# Patient Record
Sex: Male | Born: 1950 | Race: White | Hispanic: No | Marital: Married | Smoking: Never smoker
Health system: Southern US, Community
[De-identification: ages and names within clinical notes are randomized; demographics above are authoritative.]

## PROBLEM LIST (undated history)

## (undated) DIAGNOSIS — C61 Malignant neoplasm of prostate: Secondary | ICD-10-CM

## (undated) DIAGNOSIS — E785 Hyperlipidemia, unspecified: Secondary | ICD-10-CM

## (undated) DIAGNOSIS — I491 Atrial premature depolarization: Secondary | ICD-10-CM

## (undated) DIAGNOSIS — I2510 Atherosclerotic heart disease of native coronary artery without angina pectoris: Secondary | ICD-10-CM

## (undated) DIAGNOSIS — D649 Anemia, unspecified: Secondary | ICD-10-CM

## (undated) DIAGNOSIS — C801 Malignant (primary) neoplasm, unspecified: Secondary | ICD-10-CM

## (undated) DIAGNOSIS — M1990 Unspecified osteoarthritis, unspecified site: Secondary | ICD-10-CM

## (undated) DIAGNOSIS — G4730 Sleep apnea, unspecified: Secondary | ICD-10-CM

## (undated) DIAGNOSIS — E119 Type 2 diabetes mellitus without complications: Secondary | ICD-10-CM

## (undated) DIAGNOSIS — I4892 Unspecified atrial flutter: Secondary | ICD-10-CM

## (undated) DIAGNOSIS — J189 Pneumonia, unspecified organism: Secondary | ICD-10-CM

## (undated) DIAGNOSIS — K219 Gastro-esophageal reflux disease without esophagitis: Secondary | ICD-10-CM

## (undated) DIAGNOSIS — I10 Essential (primary) hypertension: Secondary | ICD-10-CM

## (undated) HISTORY — DX: Unspecified atrial flutter: I48.92

## (undated) HISTORY — PX: OTHER SURGICAL HISTORY: SHX169

## (undated) HISTORY — PX: CHOLECYSTECTOMY: SHX55

## (undated) HISTORY — DX: Malignant neoplasm of prostate: C61

## (undated) HISTORY — DX: Atherosclerotic heart disease of native coronary artery without angina pectoris: I25.10

## (undated) HISTORY — PX: GASTRIC BYPASS: SHX52

---

## 2012-01-04 DIAGNOSIS — Z9884 Bariatric surgery status: Secondary | ICD-10-CM | POA: Insufficient documentation

## 2014-09-25 HISTORY — PX: CARDIAC CATHETERIZATION: SHX172

## 2014-09-30 ENCOUNTER — Emergency Department (HOSPITAL_COMMUNITY)
Admission: EM | Admit: 2014-09-30 | Discharge: 2014-09-30 | Disposition: A | Discharge status: Home / Self Care | Payer: BLUE CROSS/BLUE SHIELD | Attending: Emergency Medicine | Admitting: Emergency Medicine

## 2014-09-30 ENCOUNTER — Emergency Department (HOSPITAL_COMMUNITY): Payer: BLUE CROSS/BLUE SHIELD

## 2014-09-30 ENCOUNTER — Encounter (HOSPITAL_COMMUNITY): Payer: Self-pay | Admitting: Family Medicine

## 2014-09-30 DIAGNOSIS — M7981 Nontraumatic hematoma of soft tissue: Secondary | ICD-10-CM | POA: Insufficient documentation

## 2014-09-30 DIAGNOSIS — R0600 Dyspnea, unspecified: Secondary | ICD-10-CM | POA: Diagnosis not present

## 2014-09-30 DIAGNOSIS — R0602 Shortness of breath: Secondary | ICD-10-CM | POA: Diagnosis present

## 2014-09-30 DIAGNOSIS — R609 Edema, unspecified: Secondary | ICD-10-CM | POA: Diagnosis not present

## 2014-09-30 DIAGNOSIS — Z8669 Personal history of other diseases of the nervous system and sense organs: Secondary | ICD-10-CM | POA: Diagnosis not present

## 2014-09-30 DIAGNOSIS — Z859 Personal history of malignant neoplasm, unspecified: Secondary | ICD-10-CM | POA: Diagnosis not present

## 2014-09-30 DIAGNOSIS — E119 Type 2 diabetes mellitus without complications: Secondary | ICD-10-CM | POA: Diagnosis not present

## 2014-09-30 DIAGNOSIS — R0609 Other forms of dyspnea: Secondary | ICD-10-CM

## 2014-09-30 HISTORY — DX: Type 2 diabetes mellitus without complications: E11.9

## 2014-09-30 HISTORY — DX: Sleep apnea, unspecified: G47.30

## 2014-09-30 HISTORY — DX: Malignant (primary) neoplasm, unspecified: C80.1

## 2014-09-30 LAB — BASIC METABOLIC PANEL
ANION GAP: 8 (ref 5–15)
BUN: 14 mg/dL (ref 6–20)
CALCIUM: 8.9 mg/dL (ref 8.9–10.3)
CO2: 23 mmol/L (ref 22–32)
Chloride: 108 mmol/L (ref 101–111)
Creatinine, Ser: 0.81 mg/dL (ref 0.61–1.24)
GFR calc non Af Amer: >60 mL/min (ref >60)
Glucose, Bld: 113 mg/dL — ABNORMAL HIGH (ref 65–99)
Potassium: 4.3 mmol/L (ref 3.5–5.1)
Sodium: 139 mmol/L (ref 135–145)

## 2014-09-30 LAB — CBC
HEMATOCRIT: 44.2 % (ref 39.0–52.0)
Hemoglobin: 15.3 g/dL (ref 13.0–17.0)
MCH: 31.5 pg (ref 26.0–34.0)
MCHC: 34.6 g/dL (ref 30.0–36.0)
MCV: 91.1 fL (ref 78.0–100.0)
PLATELETS: 236 10*3/uL (ref 150–400)
RBC: 4.85 MIL/uL (ref 4.22–5.81)
RDW: 12.6 % (ref 11.5–15.5)
WBC: 7.8 10*3/uL (ref 4.0–10.5)

## 2014-09-30 LAB — I-STAT TROPONIN, ED: Troponin i, poc: 0 ng/mL (ref 0.00–0.08)

## 2014-09-30 LAB — BRAIN NATRIURETIC PEPTIDE: B NATRIURETIC PEPTIDE 5: 35.2 pg/mL (ref 0.0–100.0)

## 2014-09-30 NOTE — ED Notes (Signed)
Pulse Ox at 96-98% on room air and 74 Heart rate.  Patient denies feeling SOB, no lightheaded or dizziness.

## 2014-09-30 NOTE — Discharge Instructions (Signed)

## 2014-09-30 NOTE — ED Notes (Signed)
Pt here for SOB and fatigue over the weekend. sts recent cath last week. sts told 2 blockages.

## 2014-09-30 NOTE — ED Provider Notes (Signed)
CSN: [id number]     Arrival date & time 09/30/14  0626 History   First MD Initiated Contact with Patient 09/30/14 [phone]     Chief Complaint  Patient presents with  . Shortness of Breath    Patient is a 64 y.o. male presenting with shortness of breath. The history is provided by the patient. No language interpreter was used.  Shortness of Breath  [name] presents for evaluation of shortness of breath. He had a cardiac cath a week ago Friday for irregular heartbeat as well as abnormal stress test. The heart Demonstrated a complete occlusion of the LAD with good collateral flow. The procedure was performed at [facility] and the cath was performed through his right wrist. He was feeling well after the cath. He began feeling fatigued this weekend and today increased shortness of breath and dyspnea on exertion. He denies any fevers, chest pain, abdominal pain, vomiting, diarrhea. The only new medication is Imdur. Symptoms are moderate, constant, worsening.  Past Medical History  Diagnosis Date  . Diabetes mellitus without complication   . Sleep apnea   . Cancer    Past Surgical History  Procedure Laterality Date  . Gastric bypass    . Cardiac surgery     History reviewed. No pertinent family history. History  Substance Use Topics  . Smoking status: Never Smoker   . Smokeless tobacco: Not on file  . Alcohol Use: Yes     Comment: occ    Review of Systems  Respiratory: Positive for shortness of breath.   All other systems reviewed and are negative.     Allergies  Review of patient's allergies indicates no known allergies.  Home Medications   Prior to Admission medications   Not on File   BP 154/95 mmHg  Pulse 68  Temp(Src) 98.2 F (36.8 C)  Resp 18  Ht 6\' 3"  (1.905 m)  Wt 360 lb (163.295 kg)  BMI 45.00 kg/m2  SpO2 98% Physical Exam  Constitutional: He is oriented to person, place, and time. He appears well-developed and well-nourished.  HENT:  Head:  Normocephalic and atraumatic.  Cardiovascular: Normal rate and regular rhythm.   No murmur heard. Pulmonary/Chest: Effort normal and breath sounds normal. No respiratory distress.  Abdominal: Soft. There is no tenderness. There is no rebound and no guarding.  Musculoskeletal: He exhibits no tenderness.  2+ edema in bilateral lower extremities, bruise over right wrist. 2+ radial pulses.  Neurological: He is alert and oriented to person, place, and time.  Skin: Skin is warm and dry.  Psychiatric: He has a normal mood and affect. His behavior is normal.  Nursing note and vitals reviewed.   ED Course  Procedures (including critical care time) Labs Review Labs Reviewed  BASIC METABOLIC PANEL - Abnormal; Notable for the following:    Glucose, Bld 113 (*)    All other components within normal limits  CBC  BRAIN NATRIURETIC PEPTIDE  I-STAT TROPOININ, ED    Imaging Review Dg Chest 2 View  09/30/2014   CLINICAL DATA:  Shortness of breath this morning, heart disease, recent catheterization, diabetes, sleep apnea  EXAM: CHEST  2 VIEW  COMPARISON:  None  FINDINGS: Normal heart size and pulmonary vascularity.  Atherosclerotic calcification and tortuosity of thoracic aorta.  Minimal central peribronchial thickening.  No infiltrate, pleural effusion or pneumothorax.  Bones unremarkable.  IMPRESSION: Minimal bronchitic changes without infiltrate.   Electronically Signed   By: Lavonia Dana M.D.   On: 09/30/2014 10:42  EKG Interpretation   Date/Time:  Monday September 30 2014 09:03:29 EDT Ventricular Rate:  73 PR Interval:  142 QRS Duration: 92 QT Interval:  404 QTC Calculation: 445 R Axis:   -87 Text Interpretation:  Unusual P axis, possible ectopic atrial rhythm with  Premature atrial complexes Left anterior fascicular block Septal infarct ,  age undetermined Abnormal ECG Confirmed by Hazle Coca [id number]) on 09/30/2014  9:51:53 AM      MDM   Final diagnoses:  Dyspnea on exertion    Patient  here for evaluation of dyspnea on exertion and shortness of breath. EKG without any acute ischemic changes. There is no evidence of acute congestive heart failure, pneumonia, COPD exacerbation. History of presentation is not consistent with PE. Patient is requesting cardiology follow-up, contacted cardiology and they will contact the patient with an appointment time. Discussed unclear cause of dyspnea and recommend close outpatient follow-up as well as return precautions.    Quintella Reichert, MD 09/30/14 1145

## 2014-10-03 ENCOUNTER — Telehealth: Payer: Self-pay | Admitting: Cardiovascular Disease

## 2014-10-03 NOTE — Telephone Encounter (Signed)
New message      Pt was seen in the ER on Monday, June 6th.  He is a Theme park manager.  He said he was told to call and ask for Lauren about getting an appt with Dr Burt Knack.  I cannot remember if Dr Burt Knack saw him in the ER and if Dr Burt Knack told pt to call and ask for Lauren.

## 2014-10-03 NOTE — Telephone Encounter (Signed)
Per Dr Burt Knack the pt needs to obtain his 09/20/14 cardiac cath films from [facility] for review. I spoke with the pt and he will obtain his cath films and forward this to our office. The pt saw Dr Shirline Frees yesterday for cardiology follow-up and he is seeking a second opinion on CAD by Dr Burt Knack. Once I receive his cath films I will contact he pt to schedule an appointment. Pt agreed with plan.

## 2014-10-11 ENCOUNTER — Ambulatory Visit: Payer: BLUE CROSS/BLUE SHIELD | Admitting: Cardiology

## 2014-10-23 ENCOUNTER — Encounter: Payer: Self-pay | Admitting: Cardiovascular Disease

## 2014-10-23 ENCOUNTER — Ambulatory Visit (INDEPENDENT_AMBULATORY_CARE_PROVIDER_SITE_OTHER): Payer: BLUE CROSS/BLUE SHIELD | Admitting: Cardiovascular Disease

## 2014-10-23 VITALS — BP 158/80 | HR 34 | Ht 75.0 in | Wt 367.8 lb

## 2014-10-23 DIAGNOSIS — I2510 Atherosclerotic heart disease of native coronary artery without angina pectoris: Secondary | ICD-10-CM | POA: Diagnosis not present

## 2014-10-23 NOTE — Patient Instructions (Signed)
Medication Instructions:  Your physician recommends that you continue on your current medications as directed. Please refer to the Current Medication list given to you today.  Labwork: No new orders.   Testing/Procedures: No new orders.  Follow-Up: Your physician wants you to follow-up in: 3 MONTHS (October 2016) with Dr Burt Knack.  You will receive a reminder letter in the mail two months in advance. If you don't receive a letter, please call our office to schedule the follow-up appointment.   Any Other Special Instructions Will Be Listed Below (If Applicable).  You have been referred to [place].

## 2014-10-23 NOTE — Progress Notes (Signed)
Cardiology Office Note Date:  10/23/2014   ID:  Garrett Welch, DOB 06/05/1950, MRN [id number]  PCP:  Tempie Hoist, MD  Cardiologist:  Sherren Mocha, MD    Chief Complaint  Patient presents with  . Coronary Artery Disease    History of Present Illness: Garrett Welch is a 64 y.o. male who presents for a second opinion about his coronary artery disease.   The patient was recently referred to cardiology for heart  "irregularity."    The patient was noted to have an abnormal EKG and because of diabetes and obesity he was referred for a stress nuclear scan. At low level activity he developed ischemic EKG changes and nonsustained VT. He was subsequently referred for cardiac catheterization. This demonstrated total occlusion of the LAD beginning at the ostium of the vessel. The LAD is collateralized from the right coronary artery. 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 The patient's left ventricular function is normal. Medical therapy has been recommended. He presents today for a second opinion.   the patient was diagnosed with type 2 diabetes approximately 2 years ago. He takes metformin and tolerates this well. He did undergo gastric bypass surgery several years ago at which time he weighed 437 pounds. His weight decreased to 280 pounds and he is now back up in the range of 350 pounds. He does admit to dyspnea with exertion. He has no chest pain or pressure. He has not had lightheadedness or syncope. He is limited by knee pain , fatigue, and exertional dyspnea. He does not have orthopnea or PND. He does have chronic leg swelling and wears compression hose.   Past Medical History  Diagnosis Date  . Diabetes mellitus without complication   . Sleep apnea   . Cancer   . Prostate cancer     Past Surgical History  Procedure Laterality Date  . Gastric bypass      Current Outpatient Prescriptions    Medication Sig Dispense Refill  . acetaminophen (TYLENOL) 500 MG tablet Take 1,000 mg by mouth every 8 (eight) hours as needed (pain).    Marland Kitchen albuterol (PROVENTIL) (2.5 MG/3ML) 0.083% nebulizer solution Take 2.5 mg by nebulization every 6 (six) hours as needed for wheezing or shortness of breath.     . Aspirin (ASPIR-81 PO) Take 81 mg by mouth daily.    . ferrous sulfate 325 (65 FE) MG EC tablet Take 325 mg by mouth 2 (two) times daily.     . fluticasone (FLONASE) 50 MCG/ACT nasal spray Place 1 spray into both nostrils daily.    . isosorbide mononitrate (IMDUR) 30 MG 24 hr tablet Take 30 mg by mouth daily.  5  . metFORMIN (GLUCOPHAGE) 500 MG tablet Take 500 mg by mouth 2 (two) times daily.  9  . Multiple Vitamins-Minerals (MULTIVITAMIN WITH MINERALS) tablet Take 1 tablet by mouth daily.    . naproxen sodium (ANAPROX) 220 MG tablet Take 220 mg by mouth daily as needed (pain).    Marland Kitchen omega-3 fish oil (MAXEPA) 1000 MG CAPS capsule Take 2 g by mouth daily.    . pramipexole (MIRAPEX) 0.125 MG tablet Take 0.125 mg by mouth at bedtime.  2  . sodium fluoride (PREVIDENT 5000 PLUS) 1.1 % CREA dental cream Take 1 application by mouth at bedtime.    . tamsulosin (FLOMAX) 0.4 MG CAPS capsule Take 0.4 mg by mouth daily.  3   No current facility-administered  medications for this visit.    Allergies:   Tape   Social History:  The patient  reports that he has never smoked. He does not have any smokeless tobacco history on file. He reports that he drinks alcohol.   Family History:  The patient's  family history includes Cancer in his father; Heart attack in his mother; Heart disease in his mother; Heart failure in his mother; Kidney failure in his father; Stroke in his father.    ROS:  Please see the history of present illness.  Otherwise, review of systems is positive for  Bilateral knee pain, fatigue, exertional dyspnea.  All other systems are reviewed and negative.    PHYSICAL EXAM: VS:  BP 158/80 mmHg   Pulse 34  Ht 6\' 3"  (1.905 m)  Wt 367 lb 12.8 oz (166.833 kg)  BMI 45.97 kg/m2 , BMI Body mass index is 45.97 kg/(m^2). GEN: Well nourished, well developed, in no acute distress HEENT: normal Neck: no JVD, no masses. No carotid bruits Cardiac: RRR without murmur or gallop                Respiratory:  clear to auscultation bilaterally, normal work of breathing GI: soft, nontender, nondistended, + BS MS: no deformity or atrophy Ext: 1+ bilateral pretibial edema Skin: warm and dry, no rash Neuro:  Strength and sensation are intact Psych: euthymic mood, full affect  EKG:  EKG is not ordered today.  Recent Labs: 09/30/2014: B Natriuretic Peptide 35.2; BUN 14; Creatinine, Ser 0.81; Hemoglobin 15.3; Platelets 236; Potassium 4.3; Sodium 139   Lipid Panel  No results found for: CHOL, TRIG, HDL, CHOLHDL, VLDL, LDLCALC, LDLDIRECT    Wt Readings from Last 3 Encounters:  10/23/14 367 lb 12.8 oz (166.833 kg)  09/30/14 360 lb (163.295 kg)     Cardiac Studies Reviewed: Cardiac Cath 09/20/2014: FINDINGS: 1. Left Main: Patent. 2. LAD: Occluded in the ostial portion 3. Circumflex: The circumflex comes off the right coronary cusp adjacent to the right coronary artery. There is a 40-50% stenosis in the midportion. There is a intermediate ramus artery which is widely patent comes off the left main coronary artery 4. Right Coronary Artery: Widely patent. The RCA is large. The PDA and PLV branches are widely patent. The RCA supplies good collateral flow to the mid and distal LAD aortic pressure is  5. Hemodynamics: aortic pressure is128/67 mmHg LV pressure is 139/10 mmHg  CONCLUSIONS: 1. 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 Plan medical therapy  24 hour Holter: 24 hour Holter recording beginning 10/04/14.  1.The patient remained in sinus rhythm throughout  with brief exceptions as noted below.Heart rate transiently fell as low as 33 bpm during sleeping hours.Average heart rate was 65 bpm with maximum rate of 103 bpm.  2.A single PVC was recorded.  3.There were frequent premature atrial contractions often occurring in a bigeminal rhythm.11,861 PACs were counted.There were 86 runs of supraventricular ectopic beats of 3-5 beats.These had slow rates ranging from 61 bpm to 98 beats per minute.  4.There appeared to be some sinus node suppression during sleeping hours with pauses after PACs up to about 2 seconds.There may have been at least one nonconducted PAC but it is difficult to determine this from the tracing.  5.There were no significant ST-T abnormalities.    Conclusions: Abnormal Holter recording with very frequent premature atrial ectopic beats as  described above.  ASSESSMENT AND PLAN: 1.  CAD, native vessel, without angina. Total occlusion of the LAD. 2. Type 2 DM  3. Essential HTN 4. Hyperlipidemia   the patient presents today for an opinion regarding his obstructive coronary artery disease.  I have personally reviewed his cardiac catheterization films. He appears to have minimal symptoms at this time. However, it is concerning that he may have had a high risk nuclear stress test. I am unable to find the stress test report through [facility].'  We are requesting these records , as well as his echocardiogram. We reviewed potential treatment options regarding his coronary artery disease. These include medical therapy, surgical revascularization, or PCI. Because of ostial flush occlusion of the LAD , I think percutaneous revascularization is not a good option especially in the setting of his diabetes. Single-vessel bypass would be a reasonable consideration with a LIMA to LAD graft , but his morbid obesity would clearly place him at increased risk of the postoperative morbidity.  Pending review of his nuclear scan, I  think it is entirely reasonable to treat him medically and enroll him in cardiac rehabilitation for monitored exercise. As long as he is not symptomatic with low-level activity , an approach with medical therapy seems most appropriate. I have reviewed his Holter monitor and he does have bradycardia arrhythmias. I think a beta blocker is probably not a good idea. He would benefit from an ACE inhibitor or ARB in the setting of coronary artery disease and diabetes.   He requests to be followed regularly through our cardiology practice. I will see him back in 3 months and will also keep in touch with cardiac rehabilitation to make sure that he is progressing without any problems. We reviewed the importance of lifestyle modification in detail today. Will review flow sheets from cardiac rehabilitation and as long as his blood pressure will allow, I would anticipate starting him on an ARB in the near future.   Current medicines are reviewed with the patient today.  The patient does not have concerns regarding medicines.  Labs/ tests ordered today include:  No orders of the defined types were placed in this encounter.    Disposition:   FU 3 months  Signed, Sherren Mocha, MD  10/23/2014 12:32 PM    [facility] [address], [place], [place]  [address] Phone: [phone]; Fax: [phone]

## 2014-10-31 ENCOUNTER — Telehealth (HOSPITAL_COMMUNITY): Payer: Self-pay | Admitting: *Deleted

## 2014-10-31 NOTE — Telephone Encounter (Signed)
Received signed MD order from Dr. Burt Knack.  Per care everywhere note pt referred to [facility]. Called and left message on voice mail to please contact [facility]. Cherre Huger, BSN

## 2014-11-14 ENCOUNTER — Encounter (HOSPITAL_COMMUNITY)
Admission: RE | Admit: 2014-11-14 | Discharge: 2014-11-14 | Disposition: A | Discharge status: Home / Self Care | Payer: BLUE CROSS/BLUE SHIELD | Source: Ambulatory Visit | Attending: Cardiovascular Disease | Admitting: Cardiovascular Disease

## 2014-11-14 DIAGNOSIS — I209 Angina pectoris, unspecified: Secondary | ICD-10-CM | POA: Insufficient documentation

## 2014-11-14 NOTE — Progress Notes (Signed)
Cardiac Rehab Medication Review by a Pharmacist  Does the patient  feel that his/her medications are working for him/her?  yes  Has the patient been experiencing any side effects to the medications prescribed?  no  Does the patient measure his/her own blood pressure or blood glucose at home?  no   Does the patient have any problems obtaining medications due to transportation or finances?   no  Understanding of regimen: good Understanding of indications: good Potential of compliance: good    Pharmacist comments: Patient showed good understanding of medications without any problems noted.   Dimitri Ped, PharmD. Clinical Pharmacist Resident Pager: [phone]

## 2014-11-18 ENCOUNTER — Encounter (HOSPITAL_COMMUNITY)
Admission: RE | Admit: 2014-11-18 | Discharge: 2014-11-18 | Disposition: A | Discharge status: Home / Self Care | Payer: BLUE CROSS/BLUE SHIELD | Source: Ambulatory Visit | Attending: Cardiovascular Disease | Admitting: Cardiovascular Disease

## 2014-11-18 ENCOUNTER — Other Ambulatory Visit: Payer: Self-pay

## 2014-11-18 DIAGNOSIS — I209 Angina pectoris, unspecified: Secondary | ICD-10-CM | POA: Diagnosis present

## 2014-11-18 DIAGNOSIS — I10 Essential (primary) hypertension: Secondary | ICD-10-CM

## 2014-11-18 LAB — GLUCOSE, CAPILLARY
GLUCOSE-CAPILLARY: 125 mg/dL — AB (ref 65–99)
Glucose-Capillary: 170 mg/dL — ABNORMAL HIGH (ref 65–99)

## 2014-11-18 MED ORDER — VALSARTAN 80 MG PO TABS: 80.0000 mg | ORAL_TABLET | Freq: Every day | ORAL | Status: DC

## 2014-11-18 NOTE — Progress Notes (Signed)
Pt started cardiac rehab today in the 6:45 Cardiac Rehab Phase II program.  Pt tolerated light exercise without difficulty.  Pt wore knee brace on right leg for support. VSS, BP elevated at rest and on exertion. Pt arrived late and walked 1/4 mile from the [employer].  Pt arrived breathing heavy which improved with rest, telemetry- Sr with occasional PAC's., asymptomatic.  Medication list reconciled.  Pt verbalized compliance with medications and denies barriers to compliance. Pt felt the Imdur has helped for his shortness of breath and fatigue. PSYCHOSOCIAL ASSESSMENT:  PHQ-0. Pt exhibits positive coping skills, hopeful outlook with supportive family. Pt wife accompanied him to orientation. No psychosocial needs identified at this time, no psychosocial interventions necessary.    Pt enjoys playing golf.   Pt cardiac rehab  goal is  to for better fitness level and to increase workloads in a safe environment. Pt has access to gym equipment and needs instruction on "boundaries" of how much and how long.  Pt encouraged to participate in education classes particularly the home exercise that will be reviewed with pt and the general exercising on your own class to increase ability to achieve these goals.   Pt long term cardiac rehab goal is feel better, able to exercise and better nutrition. Pt had gastric bypass in the past and lost a tremendous amount of weight unfortunately he has gained a large amount of this back. Pt desires to learn more about heart healthy eating for lasting weight loss. Pt doesn't have a weight in mind just would like to be less than waht he is now.  Pt oriented to exercise equipment and routine.  Understanding verbalized.

## 2014-11-20 ENCOUNTER — Encounter (HOSPITAL_COMMUNITY)
Admission: RE | Admit: 2014-11-20 | Discharge: 2014-11-20 | Disposition: A | Discharge status: Home / Self Care | Payer: BLUE CROSS/BLUE SHIELD | Source: Ambulatory Visit | Attending: Cardiovascular Disease | Admitting: Cardiovascular Disease

## 2014-11-20 DIAGNOSIS — I209 Angina pectoris, unspecified: Secondary | ICD-10-CM | POA: Diagnosis not present

## 2014-11-20 LAB — GLUCOSE, CAPILLARY
Glucose-Capillary: 143 mg/dL — ABNORMAL HIGH (ref 65–99)
Glucose-Capillary: 115 mg/dL — ABNORMAL HIGH (ref 65–99)

## 2014-11-22 ENCOUNTER — Encounter (HOSPITAL_COMMUNITY)
Admission: RE | Admit: 2014-11-22 | Discharge: 2014-11-22 | Disposition: A | Discharge status: Home / Self Care | Payer: BLUE CROSS/BLUE SHIELD | Source: Ambulatory Visit | Attending: Cardiovascular Disease | Admitting: Cardiovascular Disease

## 2014-11-22 DIAGNOSIS — I209 Angina pectoris, unspecified: Secondary | ICD-10-CM | POA: Diagnosis not present

## 2014-11-22 LAB — GLUCOSE, CAPILLARY
Glucose-Capillary: 133 mg/dL — ABNORMAL HIGH (ref 65–99)
Glucose-Capillary: 126 mg/dL — ABNORMAL HIGH (ref 65–99)

## 2014-11-22 NOTE — Progress Notes (Signed)
QUALITY OF LIFE SCORE REVIEW Patient score low in area of health and function with a score of 18.50.  Scores less than 21 are considered low.  Patient quality of life slightly altered by physical constraints which limits ability to perform as prior to recent cardiac illness.  Pt is critical of himself for allowing the decline.  Pt gained most of his weight back after his gastric bypass surgery.  As a result, diabetes and heart disease have ensued.  Pt can tell a difference even with the short period of time he has participated in CR.  Pt remains very hopeful that he will continue to have more energy and increased positive outlook on life.  Pt receives great support from his wife and well as spiritual. Offered emotional support and reassurance.  Will continue to monitor and intervene as necessary. Cherre Huger, BSN

## 2014-11-25 ENCOUNTER — Encounter (HOSPITAL_COMMUNITY)
Admission: RE | Admit: 2014-11-25 | Discharge: 2014-11-25 | Disposition: A | Discharge status: Home / Self Care | Payer: BLUE CROSS/BLUE SHIELD | Source: Ambulatory Visit | Attending: Cardiovascular Disease | Admitting: Cardiovascular Disease

## 2014-11-25 DIAGNOSIS — I209 Angina pectoris, unspecified: Secondary | ICD-10-CM | POA: Insufficient documentation

## 2014-11-25 NOTE — Progress Notes (Signed)
Reviewed home exercise with pt today.  Pt plans to walk on treadmill, use recumbent bike and hand weights for exercise, 2-3 days in addition to CRPII.  Reviewed THR, pulse, RPE, sign and symptoms, and when to call 911 or MD.  Pt voiced understanding.    Garrett Welch [employer]

## 2014-11-27 ENCOUNTER — Encounter (HOSPITAL_COMMUNITY)
Admission: RE | Admit: 2014-11-27 | Discharge: 2014-11-27 | Disposition: A | Discharge status: Home / Self Care | Payer: BLUE CROSS/BLUE SHIELD | Source: Ambulatory Visit | Attending: Cardiovascular Disease | Admitting: Cardiovascular Disease

## 2014-11-27 DIAGNOSIS — I209 Angina pectoris, unspecified: Secondary | ICD-10-CM | POA: Diagnosis not present

## 2014-11-27 LAB — GLUCOSE, CAPILLARY: Glucose-Capillary: 142 mg/dL — ABNORMAL HIGH (ref 65–99)

## 2014-11-29 ENCOUNTER — Encounter (HOSPITAL_COMMUNITY)
Admission: RE | Admit: 2014-11-29 | Discharge: 2014-11-29 | Disposition: A | Discharge status: Home / Self Care | Payer: BLUE CROSS/BLUE SHIELD | Source: Ambulatory Visit | Attending: Cardiovascular Disease | Admitting: Cardiovascular Disease

## 2014-11-29 DIAGNOSIS — I209 Angina pectoris, unspecified: Secondary | ICD-10-CM | POA: Diagnosis not present

## 2014-12-02 ENCOUNTER — Encounter (HOSPITAL_COMMUNITY)
Admission: RE | Admit: 2014-12-02 | Discharge: 2014-12-02 | Disposition: A | Discharge status: Home / Self Care | Payer: BLUE CROSS/BLUE SHIELD | Source: Ambulatory Visit | Attending: Cardiovascular Disease | Admitting: Cardiovascular Disease

## 2014-12-02 ENCOUNTER — Other Ambulatory Visit (INDEPENDENT_AMBULATORY_CARE_PROVIDER_SITE_OTHER): Payer: BLUE CROSS/BLUE SHIELD | Admitting: *Deleted

## 2014-12-02 DIAGNOSIS — I10 Essential (primary) hypertension: Secondary | ICD-10-CM | POA: Diagnosis not present

## 2014-12-02 DIAGNOSIS — I209 Angina pectoris, unspecified: Secondary | ICD-10-CM | POA: Diagnosis not present

## 2014-12-02 LAB — BASIC METABOLIC PANEL
BUN: 15 mg/dL (ref 6–23)
CO2: 25 meq/L (ref 19–32)
CREATININE: 0.91 mg/dL (ref 0.40–1.50)
Calcium: 9.2 mg/dL (ref 8.4–10.5)
Chloride: 106 mEq/L (ref 96–112)
GFR: 89.66 mL/min (ref >60.00)
Glucose, Bld: 127 mg/dL — ABNORMAL HIGH (ref 70–99)
Potassium: 4 mEq/L (ref 3.5–5.1)
Sodium: 141 mEq/L (ref 135–145)

## 2014-12-02 NOTE — Addendum Note (Signed)
Addended by: Eulis Foster on: 12/02/2014 08:12 AM   Modules accepted: Orders

## 2014-12-04 ENCOUNTER — Encounter (HOSPITAL_COMMUNITY)
Admission: RE | Admit: 2014-12-04 | Discharge: 2014-12-04 | Disposition: A | Discharge status: Home / Self Care | Payer: BLUE CROSS/BLUE SHIELD | Source: Ambulatory Visit | Attending: Cardiovascular Disease | Admitting: Cardiovascular Disease

## 2014-12-04 DIAGNOSIS — I209 Angina pectoris, unspecified: Secondary | ICD-10-CM | POA: Diagnosis not present

## 2014-12-06 ENCOUNTER — Ambulatory Visit: Payer: Self-pay | Admitting: Cardiology

## 2014-12-06 ENCOUNTER — Encounter (HOSPITAL_COMMUNITY)
Admission: RE | Admit: 2014-12-06 | Discharge: 2014-12-06 | Disposition: A | Discharge status: Home / Self Care | Payer: BLUE CROSS/BLUE SHIELD | Source: Ambulatory Visit | Attending: Cardiovascular Disease | Admitting: Cardiovascular Disease

## 2014-12-06 DIAGNOSIS — I209 Angina pectoris, unspecified: Secondary | ICD-10-CM | POA: Diagnosis not present

## 2014-12-09 ENCOUNTER — Encounter (HOSPITAL_COMMUNITY)
Admission: RE | Admit: 2014-12-09 | Discharge: 2014-12-09 | Disposition: A | Discharge status: Home / Self Care | Payer: BLUE CROSS/BLUE SHIELD | Source: Ambulatory Visit | Attending: Cardiovascular Disease | Admitting: Cardiovascular Disease

## 2014-12-09 DIAGNOSIS — I209 Angina pectoris, unspecified: Secondary | ICD-10-CM | POA: Diagnosis not present

## 2014-12-11 ENCOUNTER — Encounter (HOSPITAL_COMMUNITY)
Admission: RE | Admit: 2014-12-11 | Discharge: 2014-12-11 | Disposition: A | Discharge status: Home / Self Care | Payer: BLUE CROSS/BLUE SHIELD | Source: Ambulatory Visit | Attending: Cardiovascular Disease | Admitting: Cardiovascular Disease

## 2014-12-11 DIAGNOSIS — I209 Angina pectoris, unspecified: Secondary | ICD-10-CM | POA: Diagnosis not present

## 2014-12-13 ENCOUNTER — Encounter (HOSPITAL_COMMUNITY)
Admission: RE | Admit: 2014-12-13 | Discharge: 2014-12-13 | Disposition: A | Discharge status: Home / Self Care | Payer: BLUE CROSS/BLUE SHIELD | Source: Ambulatory Visit | Attending: Cardiovascular Disease | Admitting: Cardiovascular Disease

## 2014-12-13 DIAGNOSIS — I209 Angina pectoris, unspecified: Secondary | ICD-10-CM | POA: Diagnosis not present

## 2014-12-16 ENCOUNTER — Encounter (HOSPITAL_COMMUNITY): Payer: BLUE CROSS/BLUE SHIELD

## 2014-12-18 ENCOUNTER — Encounter (HOSPITAL_COMMUNITY)
Admission: RE | Admit: 2014-12-18 | Discharge: 2014-12-18 | Disposition: A | Discharge status: Home / Self Care | Payer: BLUE CROSS/BLUE SHIELD | Source: Ambulatory Visit | Attending: Cardiovascular Disease | Admitting: Cardiovascular Disease

## 2014-12-18 DIAGNOSIS — I209 Angina pectoris, unspecified: Secondary | ICD-10-CM | POA: Diagnosis not present

## 2014-12-20 ENCOUNTER — Encounter (HOSPITAL_COMMUNITY)
Admission: RE | Admit: 2014-12-20 | Discharge: 2014-12-20 | Disposition: A | Discharge status: Home / Self Care | Payer: BLUE CROSS/BLUE SHIELD | Source: Ambulatory Visit | Attending: Cardiovascular Disease | Admitting: Cardiovascular Disease

## 2014-12-20 DIAGNOSIS — I209 Angina pectoris, unspecified: Secondary | ICD-10-CM | POA: Diagnosis not present

## 2014-12-23 ENCOUNTER — Encounter (HOSPITAL_COMMUNITY)
Admission: RE | Admit: 2014-12-23 | Discharge: 2014-12-23 | Disposition: A | Discharge status: Home / Self Care | Payer: BLUE CROSS/BLUE SHIELD | Source: Ambulatory Visit | Attending: Cardiovascular Disease | Admitting: Cardiovascular Disease

## 2014-12-23 DIAGNOSIS — I209 Angina pectoris, unspecified: Secondary | ICD-10-CM | POA: Diagnosis not present

## 2014-12-25 ENCOUNTER — Encounter (HOSPITAL_COMMUNITY)
Admission: RE | Admit: 2014-12-25 | Discharge: 2014-12-25 | Disposition: A | Discharge status: Home / Self Care | Payer: BLUE CROSS/BLUE SHIELD | Source: Ambulatory Visit | Attending: Cardiovascular Disease | Admitting: Cardiovascular Disease

## 2014-12-25 DIAGNOSIS — I209 Angina pectoris, unspecified: Secondary | ICD-10-CM | POA: Diagnosis not present

## 2014-12-26 ENCOUNTER — Other Ambulatory Visit: Payer: Self-pay

## 2014-12-26 ENCOUNTER — Telehealth: Payer: Self-pay

## 2014-12-26 DIAGNOSIS — I10 Essential (primary) hypertension: Secondary | ICD-10-CM

## 2014-12-26 NOTE — Telephone Encounter (Signed)
Dr Burt Knack reviewed BP readings from Cardiac Rehab and he would like the pt to start HCTZ 25mg  take one by mouth daily in addition to other medications.  He also recommends repeating BMP 2 weeks after starting medication. I left the pt a message to contact our office.

## 2014-12-27 ENCOUNTER — Encounter (HOSPITAL_COMMUNITY)
Admission: RE | Admit: 2014-12-27 | Discharge: 2014-12-27 | Disposition: A | Discharge status: Home / Self Care | Payer: BLUE CROSS/BLUE SHIELD | Source: Ambulatory Visit | Attending: Cardiovascular Disease | Admitting: Cardiovascular Disease

## 2014-12-27 DIAGNOSIS — I209 Angina pectoris, unspecified: Secondary | ICD-10-CM | POA: Diagnosis present

## 2014-12-27 MED ORDER — HYDROCHLOROTHIAZIDE 25 MG PO TABS: 25.0000 mg | ORAL_TABLET | Freq: Every day | ORAL | Status: DC

## 2014-12-27 NOTE — Telephone Encounter (Signed)
I spoke with the pt and made him aware that Dr Burt Knack would like him to start HCTZ 25mg  daily and recheck BMP in 2 weeks. Rx sent to the pharmacy and lab appointment scheduled on 01/10/15.  The pt has also been seeing the wound clinic for venous insufficiency and is having his legs wrapped.  The pt does have pain and was given a Rx for Tramadol 50mg  to use as needed for pain.  At this time the pt will continue to have his BP monitored at cardiac rehab.

## 2014-12-27 NOTE — Progress Notes (Signed)
Pt reported at cardiac rehab this morning addition of tramadol 50mg  q6hours PRN.  Pt reports he is currently taking qHS PRN.  Pt instructed to verify appropriateness of this medication with his cardiologist. Pt states he will discuss with Dr. Antionette Char nurse via phone today. Pt also instructed to take the least amount possible.  Understanding verbalized

## 2014-12-27 NOTE — Progress Notes (Signed)
Garrett Welch 64 y.o. male Nutrition Note Spoke with pt. Pt is obese. Per pt, "I lost 140 lb and have gained 80 lb back." Pt reports 10 lb wt loss since changing his diet over the last 2 months. Nutrition Plan and Nutrition Survey goals reviewed with pt. Pt is following Step 1 of the Therapeutic Lifestyle Changes diet. Pt is diabetic. No recent A1c noted. Pt does not recall what his last A1c was. This Probation officer went over Diabetes Education test results. Pt checks CBG's 2 times a day. Post-prandial morning CBG's reportedly 150-200 mg/dL. Pt expressed understanding of the information reviewed. Pt aware of nutrition education classes offered. No results found for: HGBA1C  Nutrition Diagnosis ? Food-and nutrition-related knowledge deficit related to lack of exposure to information as related to diagnosis of: ? CVD ? DM ? Obesity related to excessive energy intake as evidenced by a BMI of 48.2  Nutrition RX/ Estimated Daily Nutrition Needs for: wt loss 2550-3050 Kcal, 70-80 gm fat, 16-20 gm sat fat, 2.5-3.0 gm trans-fat, <1500 mg sodium, 350-425 gm CHO   Nutrition Intervention ? Pt's individual nutrition plan reviewed with pt. ? Benefits of adopting Therapeutic Lifestyle Changes discussed when Medficts reviewed. ? Pt to attend the Portion Distortion class ? Pt to attend the Diabetes Q & A class ? Pt to attend the   ? Nutrition I class                  ? Nutrition II class     ? Diabetes Blitz class ? Pt given handouts for: ? Nutrition class schedule ? Continue client-centered nutrition education by RD, as part of interdisciplinary care. Goal(s) ? Pt to identify food quantities necessary to achieve: ? wt loss to a goal wt of 343-361 lb (156.1-164.3 kg) at graduation from cardiac rehab.  ? Pt to describe the benefit of including fruits, vegetables, whole grains, and low-fat dairy products in a heart healthy meal plan. ? CBG concentrations in the normal range or as close to normal as is  safely possible. Monitor and Evaluate progress toward nutrition goal with team. Nutrition Risk: Change to Moderate [name], M.Ed, RD, LDN, CDE 12/27/2014 8:28 AM

## 2015-01-01 ENCOUNTER — Encounter (HOSPITAL_COMMUNITY)
Admission: RE | Admit: 2015-01-01 | Discharge: 2015-01-01 | Disposition: A | Discharge status: Home / Self Care | Payer: BLUE CROSS/BLUE SHIELD | Source: Ambulatory Visit | Attending: Cardiovascular Disease | Admitting: Cardiovascular Disease

## 2015-01-01 DIAGNOSIS — I209 Angina pectoris, unspecified: Secondary | ICD-10-CM | POA: Diagnosis not present

## 2015-01-03 ENCOUNTER — Encounter (HOSPITAL_COMMUNITY)
Admission: RE | Admit: 2015-01-03 | Discharge: 2015-01-03 | Disposition: A | Discharge status: Home / Self Care | Payer: BLUE CROSS/BLUE SHIELD | Source: Ambulatory Visit | Attending: Cardiovascular Disease | Admitting: Cardiovascular Disease

## 2015-01-03 DIAGNOSIS — I209 Angina pectoris, unspecified: Secondary | ICD-10-CM | POA: Diagnosis not present

## 2015-01-06 ENCOUNTER — Encounter (HOSPITAL_COMMUNITY)
Admission: RE | Admit: 2015-01-06 | Discharge: 2015-01-06 | Disposition: A | Discharge status: Home / Self Care | Payer: BLUE CROSS/BLUE SHIELD | Source: Ambulatory Visit | Attending: Cardiovascular Disease | Admitting: Cardiovascular Disease

## 2015-01-06 DIAGNOSIS — I209 Angina pectoris, unspecified: Secondary | ICD-10-CM | POA: Diagnosis not present

## 2015-01-08 ENCOUNTER — Encounter (HOSPITAL_COMMUNITY)
Admission: RE | Admit: 2015-01-08 | Discharge: 2015-01-08 | Disposition: A | Discharge status: Home / Self Care | Payer: BLUE CROSS/BLUE SHIELD | Source: Ambulatory Visit | Attending: Cardiovascular Disease | Admitting: Cardiovascular Disease

## 2015-01-08 DIAGNOSIS — I209 Angina pectoris, unspecified: Secondary | ICD-10-CM | POA: Diagnosis not present

## 2015-01-08 NOTE — Progress Notes (Signed)
Psychosocial Assessment Pt with renewed energy since beginning the cardiac rehab program.  Pt with recent medication change with the addition of diuretic.  This in turn has decreased bp which pt feels good about.  Pt with no needs identified, no further intervention is needed. Cherre Huger, BSN

## 2015-01-10 ENCOUNTER — Other Ambulatory Visit (INDEPENDENT_AMBULATORY_CARE_PROVIDER_SITE_OTHER): Payer: BLUE CROSS/BLUE SHIELD | Admitting: *Deleted

## 2015-01-10 ENCOUNTER — Encounter (HOSPITAL_COMMUNITY)
Admission: RE | Admit: 2015-01-10 | Discharge: 2015-01-10 | Disposition: A | Discharge status: Home / Self Care | Payer: BLUE CROSS/BLUE SHIELD | Source: Ambulatory Visit | Attending: Cardiovascular Disease | Admitting: Cardiovascular Disease

## 2015-01-10 DIAGNOSIS — I10 Essential (primary) hypertension: Secondary | ICD-10-CM

## 2015-01-10 DIAGNOSIS — I209 Angina pectoris, unspecified: Secondary | ICD-10-CM | POA: Diagnosis not present

## 2015-01-10 LAB — BASIC METABOLIC PANEL
BUN: 18 mg/dL (ref 6–23)
CHLORIDE: 103 meq/L (ref 96–112)
CO2: 27 mEq/L (ref 19–32)
Calcium: 9.1 mg/dL (ref 8.4–10.5)
Creatinine, Ser: 0.96 mg/dL (ref 0.40–1.50)
GFR: 84.26 mL/min (ref >60.00)
Glucose, Bld: 161 mg/dL — ABNORMAL HIGH (ref 70–99)
Potassium: 4.1 mEq/L (ref 3.5–5.1)
SODIUM: 138 meq/L (ref 135–145)

## 2015-01-10 NOTE — Addendum Note (Signed)
Addended by: Eulis Foster on: 01/10/2015 08:29 AM   Modules accepted: Orders

## 2015-01-13 ENCOUNTER — Encounter (HOSPITAL_COMMUNITY)
Admission: RE | Admit: 2015-01-13 | Discharge: 2015-01-13 | Disposition: A | Discharge status: Home / Self Care | Payer: BLUE CROSS/BLUE SHIELD | Source: Ambulatory Visit | Attending: Cardiovascular Disease | Admitting: Cardiovascular Disease

## 2015-01-13 DIAGNOSIS — I209 Angina pectoris, unspecified: Secondary | ICD-10-CM | POA: Diagnosis not present

## 2015-01-15 ENCOUNTER — Encounter (HOSPITAL_COMMUNITY)
Admission: RE | Admit: 2015-01-15 | Discharge: 2015-01-15 | Disposition: A | Discharge status: Home / Self Care | Payer: BLUE CROSS/BLUE SHIELD | Source: Ambulatory Visit | Attending: Cardiovascular Disease | Admitting: Cardiovascular Disease

## 2015-01-15 DIAGNOSIS — I209 Angina pectoris, unspecified: Secondary | ICD-10-CM | POA: Diagnosis not present

## 2015-01-17 ENCOUNTER — Encounter (HOSPITAL_COMMUNITY)
Admission: RE | Admit: 2015-01-17 | Discharge: 2015-01-17 | Disposition: A | Discharge status: Home / Self Care | Payer: BLUE CROSS/BLUE SHIELD | Source: Ambulatory Visit | Attending: Cardiovascular Disease | Admitting: Cardiovascular Disease

## 2015-01-17 DIAGNOSIS — I209 Angina pectoris, unspecified: Secondary | ICD-10-CM | POA: Diagnosis not present

## 2015-01-20 ENCOUNTER — Encounter (HOSPITAL_COMMUNITY)
Admission: RE | Admit: 2015-01-20 | Discharge: 2015-01-20 | Disposition: A | Discharge status: Home / Self Care | Payer: BLUE CROSS/BLUE SHIELD | Source: Ambulatory Visit | Attending: Cardiovascular Disease | Admitting: Cardiovascular Disease

## 2015-01-20 DIAGNOSIS — I209 Angina pectoris, unspecified: Secondary | ICD-10-CM | POA: Diagnosis not present

## 2015-01-22 ENCOUNTER — Encounter (HOSPITAL_COMMUNITY): Payer: BLUE CROSS/BLUE SHIELD

## 2015-01-22 ENCOUNTER — Encounter: Payer: Self-pay | Admitting: Cardiovascular Disease

## 2015-01-24 ENCOUNTER — Encounter (HOSPITAL_COMMUNITY)
Admission: RE | Admit: 2015-01-24 | Discharge: 2015-01-24 | Disposition: A | Discharge status: Home / Self Care | Payer: BLUE CROSS/BLUE SHIELD | Source: Ambulatory Visit | Attending: Cardiovascular Disease | Admitting: Cardiovascular Disease

## 2015-01-24 DIAGNOSIS — I209 Angina pectoris, unspecified: Secondary | ICD-10-CM | POA: Diagnosis not present

## 2015-01-27 ENCOUNTER — Encounter (HOSPITAL_COMMUNITY)
Admission: RE | Admit: 2015-01-27 | Discharge: 2015-01-27 | Disposition: A | Discharge status: Home / Self Care | Payer: BLUE CROSS/BLUE SHIELD | Source: Ambulatory Visit | Attending: Cardiovascular Disease | Admitting: Cardiovascular Disease

## 2015-01-27 DIAGNOSIS — I209 Angina pectoris, unspecified: Secondary | ICD-10-CM | POA: Diagnosis present

## 2015-01-29 ENCOUNTER — Encounter (HOSPITAL_COMMUNITY)
Admission: RE | Admit: 2015-01-29 | Discharge: 2015-01-29 | Disposition: A | Discharge status: Home / Self Care | Payer: BLUE CROSS/BLUE SHIELD | Source: Ambulatory Visit | Attending: Cardiovascular Disease | Admitting: Cardiovascular Disease

## 2015-01-29 DIAGNOSIS — I209 Angina pectoris, unspecified: Secondary | ICD-10-CM | POA: Diagnosis not present

## 2015-01-31 ENCOUNTER — Encounter (HOSPITAL_COMMUNITY)
Admission: RE | Admit: 2015-01-31 | Discharge: 2015-01-31 | Disposition: A | Discharge status: Home / Self Care | Payer: BLUE CROSS/BLUE SHIELD | Source: Ambulatory Visit | Attending: Cardiovascular Disease | Admitting: Cardiovascular Disease

## 2015-01-31 DIAGNOSIS — I209 Angina pectoris, unspecified: Secondary | ICD-10-CM | POA: Diagnosis not present

## 2015-02-03 ENCOUNTER — Encounter (HOSPITAL_COMMUNITY)
Admission: RE | Admit: 2015-02-03 | Discharge: 2015-02-03 | Disposition: A | Discharge status: Home / Self Care | Payer: BLUE CROSS/BLUE SHIELD | Source: Ambulatory Visit | Attending: Cardiovascular Disease | Admitting: Cardiovascular Disease

## 2015-02-03 DIAGNOSIS — I209 Angina pectoris, unspecified: Secondary | ICD-10-CM | POA: Diagnosis not present

## 2015-02-05 ENCOUNTER — Telehealth (HOSPITAL_COMMUNITY): Payer: Self-pay

## 2015-02-05 ENCOUNTER — Encounter (HOSPITAL_COMMUNITY)
Admission: RE | Admit: 2015-02-05 | Discharge: 2015-02-05 | Disposition: A | Discharge status: Home / Self Care | Payer: BLUE CROSS/BLUE SHIELD | Source: Ambulatory Visit | Attending: Cardiovascular Disease | Admitting: Cardiovascular Disease

## 2015-02-05 DIAGNOSIS — I209 Angina pectoris, unspecified: Secondary | ICD-10-CM | POA: Diagnosis not present

## 2015-02-05 NOTE — Telephone Encounter (Signed)
-----   Message from Sherren Mocha, MD sent at 02/05/2015 11:43 AM EDT ----- Regarding: RE: Dinah Beers III- weight lifting request This is fine - thx ----- Message -----    From: Lequita Halt    Sent: 02/05/2015  11:03 AM      To: Sherren Mocha, MD Subject: Dinah Beers III- weight lifting request       RE: Braulio, Kiedrowski III      DOB: 1950-08-07   S/P: 09/13/14 Stable Angina  Patient has been in rehab approximately 12 weeks and is doing well. Patient will be graduating from Cardiac Rehab next week and will be transitioning to the gym for exercise. If you feel this patient is appropriate to begin strength training, please follow up with cardiac rehab staff; in order for staff to initiate weight lifting while in the program. We appreciate your assistance and referral of your patient to our program.    Thank you for your time!    Dorna Bloom, [place] ACSM RCEP

## 2015-02-05 NOTE — Progress Notes (Signed)
Patient ID: Garrett Welch, male   DOB: December 15, 1950, 64 y.o.   MRN: [id number]  Sherren Mocha, MD  Amber D Fair Cc: Barkley Boards, RN           This is fine - thx       Previous Messages     ----- Message -----   From: Lequita Halt   Sent: 02/05/2015 11:03 AM    To: Sherren Mocha, MD  Subject: [name]- weight lifting request     RE: [name], [name]   DOB: 06-12-1950    S/P: 09/13/14 Stable Angina   Patient has been in rehab approximately 12 weeks and is doing well. Patient will be graduating from Cardiac Rehab next week and will be transitioning to the gym for exercise. If you feel this patient is appropriate to begin strength training, please follow up with cardiac rehab staff; in order for staff to initiate weight lifting while in the program. We appreciate your assistance and referral of your patient to our program.     Thank you for your time!     Dorna Bloom, [place] ACSM RCEP

## 2015-02-07 ENCOUNTER — Encounter (HOSPITAL_COMMUNITY)
Admission: RE | Admit: 2015-02-07 | Discharge: 2015-02-07 | Disposition: A | Discharge status: Home / Self Care | Payer: BLUE CROSS/BLUE SHIELD | Source: Ambulatory Visit | Attending: Cardiovascular Disease | Admitting: Cardiovascular Disease

## 2015-02-07 DIAGNOSIS — I209 Angina pectoris, unspecified: Secondary | ICD-10-CM | POA: Diagnosis not present

## 2015-02-10 ENCOUNTER — Telehealth (HOSPITAL_COMMUNITY): Payer: Self-pay

## 2015-02-10 ENCOUNTER — Telehealth: Payer: Self-pay | Admitting: Cardiovascular Disease

## 2015-02-10 ENCOUNTER — Encounter (HOSPITAL_COMMUNITY)
Admission: RE | Admit: 2015-02-10 | Discharge: 2015-02-10 | Disposition: A | Discharge status: Home / Self Care | Payer: BLUE CROSS/BLUE SHIELD | Source: Ambulatory Visit | Attending: Cardiovascular Disease | Admitting: Cardiovascular Disease

## 2015-02-10 DIAGNOSIS — I209 Angina pectoris, unspecified: Secondary | ICD-10-CM | POA: Diagnosis not present

## 2015-02-10 NOTE — Telephone Encounter (Signed)
Records rec from [facility] placed in chart prep bin.

## 2015-02-10 NOTE — Telephone Encounter (Signed)
-----   Message from Sherren Mocha, MD sent at 02/05/2015 11:43 AM EDT ----- Regarding: RE: Garrett Welch- weight lifting request This is fine - thx ----- Message -----    From: Lequita Halt    Sent: 02/05/2015  11:03 AM      To: Sherren Mocha, MD Subject: Garrett Welch- weight lifting request       RE: [name], [name]      DOB: 1950-10-16   S/P: 09/13/14 Stable Angina  Patient has been in rehab approximately 12 weeks and is doing well. Patient will be graduating from Cardiac Rehab next week and will be transitioning to the gym for exercise. If you feel this patient is appropriate to begin strength training, please follow up with cardiac rehab staff; in order for staff to initiate weight lifting while in the program. We appreciate your assistance and referral of your patient to our program.    Thank you for your time!    Dorna Bloom, [place] ACSM RCEP

## 2015-02-12 ENCOUNTER — Encounter (HOSPITAL_COMMUNITY)
Admission: RE | Admit: 2015-02-12 | Discharge: 2015-02-12 | Disposition: A | Discharge status: Home / Self Care | Payer: BLUE CROSS/BLUE SHIELD | Source: Ambulatory Visit | Attending: Cardiovascular Disease | Admitting: Cardiovascular Disease

## 2015-02-12 DIAGNOSIS — I209 Angina pectoris, unspecified: Secondary | ICD-10-CM | POA: Diagnosis not present

## 2015-02-14 ENCOUNTER — Encounter (HOSPITAL_COMMUNITY)
Admission: RE | Admit: 2015-02-14 | Discharge: 2015-02-14 | Disposition: A | Discharge status: Home / Self Care | Payer: BLUE CROSS/BLUE SHIELD | Source: Ambulatory Visit | Attending: Cardiovascular Disease | Admitting: Cardiovascular Disease

## 2015-02-14 DIAGNOSIS — I209 Angina pectoris, unspecified: Secondary | ICD-10-CM | POA: Diagnosis not present

## 2015-02-14 NOTE — Progress Notes (Signed)
Pt graduated from cardiac rehab program today with completion of 36 exercise sessions in Phase II. Pt maintained good attendance to education and exercise and progressed nicely during his participation in rehab as evidenced by increased MET level.  Pt increased from 2.7 to 3.7.  Pt recently began using weights as a part of his exercise routine. Medication list reconciled. Repeat  PHQ score-0  .  Pt has made significant lifestyle changes and should be commended for his success.  Pt post Quality of life survey shows significant improvement from his initial survey.  Pt demonstrates a positive and healthy outlook and feels support from his wife.feels he has achieved his goals during cardiac rehab. Pt lost and maintained 11 pounds from the beginning of his participation in Cardiac rehab.  Pt feels confident in continuing his exercise at sport time in [place].  Pt plans to go to the gym 4 x a week.   Pt given a copy of his rehab report to take with him on his next follow up appt with Dr. Burt Knack on 11/4.  Based upon his significant achievements made in rehab , pt will do well.  It was indeed a pleasure to have this patient participate in Cardiac rehab!! Maurice Small RN, BSN

## 2015-02-17 ENCOUNTER — Encounter (HOSPITAL_COMMUNITY): Admission: RE | Admit: 2015-02-17 | Payer: BLUE CROSS/BLUE SHIELD | Source: Ambulatory Visit

## 2015-02-19 ENCOUNTER — Encounter (HOSPITAL_COMMUNITY): Payer: BLUE CROSS/BLUE SHIELD

## 2015-02-21 ENCOUNTER — Encounter (HOSPITAL_COMMUNITY): Payer: BLUE CROSS/BLUE SHIELD

## 2015-02-27 NOTE — Progress Notes (Signed)
Cardiology Office Note Date:  03/02/2015   ID:  Garrett Welch, DOB 1950-11-01, MRN [id number]  PCP:  Tempie Hoist, MD  Cardiologist:  Sherren Mocha, MD    Chief Complaint  Patient presents with  . Coronary Artery Disease    History of Present Illness: Garrett Welch is a 64 y.o. male who presents for follow-up of coronary artery disease. The patient was initially seen in June after an abnormal nuclear stress test. He underwent cardiac catheterization demonstrating total occlusion of the LAD at the ostium with collaterals. Medical therapy was recommended. The patient has been completely asymptomatic. He was enrolled in cardiac rehabilitation. He has done well and has completed the 12 week cardiac rehabilitation session. He is treated with an ACE inhibitor in the setting of diabetes. He has not been on a beta blocker because of episodic bradycardia. The patient has lost weight and is feeling well with regular exercise. He specifically denies chest pain or pressure, shortness of breath, or heart palpitations. He's had no lightheadedness or syncope. He has had venous stasis ulceration and is followed by vascular surgery and wound care.   Past Medical History  Diagnosis Date  . Diabetes mellitus without complication ([place])   . Sleep apnea   . Cancer ([place])   . Prostate cancer [facility])     Past Surgical History  Procedure Laterality Date  . Gastric bypass      Current Outpatient Prescriptions  Medication Sig Dispense Refill  . acetaminophen (TYLENOL) 500 MG tablet Take 1,000 mg by mouth every 8 (eight) hours as needed (pain).    Marland Kitchen albuterol (PROVENTIL) (2.5 MG/3ML) 0.083% nebulizer solution Take 2.5 mg by nebulization every 6 (six) hours as needed for wheezing or shortness of breath.     . Aspirin (ASPIR-81 PO) Take 81 mg by mouth daily.    . ferrous sulfate 325 (65 FE) MG EC tablet Take 325 mg by mouth 2 (two) times daily.     . fluticasone (FLONASE) 50 MCG/ACT  nasal spray Place 1 spray into both nostrils daily.    . hydrochlorothiazide (HYDRODIURIL) 25 MG tablet Take 1 tablet (25 mg total) by mouth daily. 30 tablet 3  . isosorbide mononitrate (IMDUR) 30 MG 24 hr tablet Take 30 mg by mouth daily.  5  . metFORMIN (GLUCOPHAGE) 500 MG tablet Take 500 mg by mouth 2 (two) times daily.  9  . Multiple Vitamins-Minerals (MULTIVITAMIN WITH MINERALS) tablet Take 1 tablet by mouth daily.    . naproxen sodium (ANAPROX) 220 MG tablet Take 220 mg by mouth daily as needed (pain).    Marland Kitchen omega-3 fish oil (MAXEPA) 1000 MG CAPS capsule Take 2 g by mouth daily.    . pramipexole (MIRAPEX) 0.125 MG tablet Take 0.125 mg by mouth at bedtime.  2  . sodium fluoride (PREVIDENT 5000 PLUS) 1.1 % CREA dental cream Take 1 application by mouth at bedtime.    . tamsulosin (FLOMAX) 0.4 MG CAPS capsule Take 0.4 mg by mouth daily.  3  . valsartan (DIOVAN) 80 MG tablet Take 1 tablet (80 mg total) by mouth daily. 30 tablet 3  . atorvastatin (LIPITOR) 20 MG tablet Take 1 tablet (20 mg total) by mouth daily. 90 tablet 3   No current facility-administered medications for this visit.    Allergies:   Tape   Social History:  The patient  reports that he has never smoked. He does not have any smokeless tobacco history on file. He reports that he  drinks alcohol.   Family History:  The patient's  family history includes Cancer in his father; Heart attack in his mother; Heart disease in his mother; Heart failure in his mother; Hypertension in his maternal grandmother and mother; Kidney failure in his father; Stroke in his father.    ROS:  Please see the history of present illness.  Otherwise, review of systems is positive for knee pain.  All other systems are reviewed and negative.    PHYSICAL EXAM: VS:  BP 130/78 mmHg  Pulse 93  Ht 6' 1.25" (1.861 m)  Wt 357 lb (161.934 kg)  BMI 46.76 kg/m2  SpO2 93% , BMI Body mass index is 46.76 kg/(m^2). GEN: Well nourished, well developed, pleasant  obese male in no acute distress HEENT: normal Neck: no JVD, no masses. No carotid bruits Cardiac: RRR without murmur or gallop                Respiratory:  clear to auscultation bilaterally, normal work of breathing GI: soft, nontender, nondistended, + BS MS: no deformity or atrophy Ext: stockings in place, 1+ edema Skin: warm and dry, no rash Neuro:  Strength and sensation are intact Psych: euthymic mood, full affect  EKG:  EKG is not ordered today.  Recent Labs: 09/30/2014: B Natriuretic Peptide 35.2; Hemoglobin 15.3; Platelets 236 01/10/2015: BUN 18; Creatinine, Ser 0.96; Potassium 4.1; Sodium 138   Lipid Panel  No results found for: CHOL, TRIG, HDL, CHOLHDL, VLDL, LDLCALC, LDLDIRECT    Wt Readings from Last 3 Encounters:  02/28/15 357 lb (161.934 kg)  11/14/14 368 lb 2.7 oz (167 kg)  10/23/14 367 lb 12.8 oz (166.833 kg)    Cardiac Studies Reviewed: Cardiac Cath 09/20/2014: FINDINGS: 1. Left Main: Patent. 2. LAD: Occluded in the ostial portion 3. Circumflex: The circumflex comes off the right coronary cusp adjacent to the right coronary artery. There is a 40-50% stenosis in the midportion. There is a intermediate ramus artery which is widely patent comes off the left main coronary artery 4. Right Coronary Artery: Widely patent. The RCA is large. The PDA and PLV branches are widely patent. The RCA supplies good collateral flow to the mid and distal LAD  5. Hemodynamics: aortic pressure is128/67 mmHg LV pressure is 139/10 mmHg  CONCLUSIONS: 1. 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 Plan medical therapy  24 hour Holter: 24 hour Holter recording beginning 10/04/14.  1.The patient remained in sinus rhythm throughout with brief exceptions as noted below.Heart rate transiently fell as low as 33 bpm during sleeping hours.Average heart  rate was 65 bpm with maximum rate of 103 bpm.  2.A single PVC was recorded.  3.There were frequent premature atrial contractions often occurring in a bigeminal rhythm.11,861 PACs were counted.There were 86 runs of supraventricular ectopic beats of 3-5 beats.These had slow rates ranging from 61 bpm to 98 beats per minute.  4.There appeared to be some sinus node suppression during sleeping hours with pauses after PACs up to about 2 seconds.There may have been at least one nonconducted PAC but it is difficult to determine this from the tracing.  5.There were no significant ST-T abnormalities.    Conclusions: Abnormal Holter recording with very frequent premature atrial ectopic beats as described above.  ASSESSMENT AND PLAN: 1.  CAD, native vessel, without symptoms of angina: The patient is doing well. He is prescribed antiplatelet therapy with aspirin  81 mg daily. He has completed cardiac rehabilitation. He is tolerating medical therapy without problems. May consider repeat stress testing in the future considering known proximal LAD occlusion. I will see him back in 6 months.  2. Hyperlipidemia: I reviewed his lipids through Care Everywhere. LDL is greater than 100 mg/dL. Will start him on atorvastatin 20 mg and repeat lipids and LFTs in 3 months. He has not been on a statin drug. I reviewed potential side effects with him.  3. Essential hypertension: Blood pressure appears to be well-controlled on Diovan, hydrochlorothiazide, and isosorbide.  Current medicines are reviewed with the patient today.  The patient does not have concerns regarding medicines.  Labs/ tests ordered today include:   Orders Placed This Encounter  Procedures  . Lipid panel  . Hepatic function panel    Disposition:   FU 6 months  Signed, Sherren Mocha, MD  03/02/2015 9:24 PM    [address], [place], [place]  [address] Phone: [phone]; Fax: [phone]

## 2015-02-28 ENCOUNTER — Encounter: Payer: Self-pay | Admitting: Cardiovascular Disease

## 2015-02-28 ENCOUNTER — Ambulatory Visit (INDEPENDENT_AMBULATORY_CARE_PROVIDER_SITE_OTHER): Payer: BLUE CROSS/BLUE SHIELD | Admitting: Cardiovascular Disease

## 2015-02-28 VITALS — BP 130/78 | HR 93 | Ht 73.25 in | Wt 357.0 lb

## 2015-02-28 DIAGNOSIS — E785 Hyperlipidemia, unspecified: Secondary | ICD-10-CM

## 2015-02-28 DIAGNOSIS — I2510 Atherosclerotic heart disease of native coronary artery without angina pectoris: Secondary | ICD-10-CM

## 2015-02-28 MED ORDER — ATORVASTATIN CALCIUM 20 MG PO TABS: 20.0000 mg | ORAL_TABLET | Freq: Every day | ORAL | Status: DC

## 2015-02-28 NOTE — Patient Instructions (Signed)
Medication Instructions:  Your physician has recommended you make the following change in your medication:  1. START Atorvastatin 20mg  take one tablet by mouth every evening  Labwork: Your physician recommends that you return for a FASTING LIPID and LIVER in 3 MONTHS--nothing to eat or drink after midnight, lab opens at 7:30 AM  Testing/Procedures: No new orders.   Follow-Up: Your physician wants you to follow-up in: 6 MONTHS with Dr Burt Knack.  You will receive a reminder letter in the mail two months in advance. If you don't receive a letter, please call our office to schedule the follow-up appointment.   Any Other Special Instructions Will Be Listed Below (If Applicable).     If you need a refill on your cardiac medications before your next appointment, please call your pharmacy.

## 2015-03-06 ENCOUNTER — Other Ambulatory Visit: Payer: Self-pay | Admitting: Cardiovascular Disease

## 2015-03-17 ENCOUNTER — Encounter: Payer: Self-pay | Admitting: Cardiovascular Disease

## 2015-04-01 ENCOUNTER — Telehealth: Payer: Self-pay | Admitting: Cardiovascular Disease

## 2015-04-01 NOTE — Telephone Encounter (Signed)
I spoke with the pt and he started Atorvastatin 20mg  daily per instructions during 02/28/15 OV. The pt took this medication for 3 weeks and he noticed muscle aches and pain and complained of pain when standing.  The pt stopped Atorvastatin 1 week ago and his symptoms have improved.  I advised the pt to remain off of this medication at this time and I will make Dr Burt Knack aware of this information. Pt agreed with plan.

## 2015-04-01 NOTE — Telephone Encounter (Signed)
New MEssage  Pt calling about issues he has w/ Lipitor- pt c/o of muscle soreness- wonders if there is an alternative. Please call back and discuss.

## 2015-04-02 NOTE — Telephone Encounter (Signed)
Would try crestor 5 mg daily. thx

## 2015-04-03 NOTE — Telephone Encounter (Signed)
Left message on machine for pt to contact the office.  The pt will also need to have a lipid and liver checked in 3 MONTHS after starting Crestor.

## 2015-04-07 MED ORDER — ROSUVASTATIN CALCIUM 5 MG PO TABS: 5.0000 mg | ORAL_TABLET | Freq: Every day | ORAL | Status: DC

## 2015-04-07 NOTE — Telephone Encounter (Signed)
I spoke with the pt and made him aware of Dr Antionette Char recommendation.  Rx sent to the pharmacy. The pt has pending lab appointment on 06/02/2015.

## 2015-04-17 ENCOUNTER — Other Ambulatory Visit: Payer: Self-pay | Admitting: Cardiovascular Disease

## 2015-06-02 ENCOUNTER — Other Ambulatory Visit: Payer: BLUE CROSS/BLUE SHIELD

## 2015-06-05 ENCOUNTER — Other Ambulatory Visit (INDEPENDENT_AMBULATORY_CARE_PROVIDER_SITE_OTHER): Payer: BLUE CROSS/BLUE SHIELD | Admitting: *Deleted

## 2015-06-05 DIAGNOSIS — I2510 Atherosclerotic heart disease of native coronary artery without angina pectoris: Secondary | ICD-10-CM

## 2015-06-05 DIAGNOSIS — E785 Hyperlipidemia, unspecified: Secondary | ICD-10-CM | POA: Diagnosis not present

## 2015-06-05 LAB — LIPID PANEL
CHOLESTEROL: 141 mg/dL (ref 125–200)
HDL: 47 mg/dL (ref >=40)
LDL CALC: 81 mg/dL (ref <130)
Total CHOL/HDL Ratio: 3 Ratio (ref <=5.0)
Triglycerides: 65 mg/dL (ref <150)
VLDL: 13 mg/dL (ref <30)

## 2015-06-05 LAB — HEPATIC FUNCTION PANEL
ALT: 30 U/L (ref 9–46)
AST: 23 U/L (ref 10–35)
Albumin: 4 g/dL (ref 3.6–5.1)
Alkaline Phosphatase: 47 U/L (ref 40–115)
BILIRUBIN INDIRECT: 0.4 mg/dL (ref 0.2–1.2)
Bilirubin, Direct: 0.1 mg/dL (ref <=0.2)
Total Bilirubin: 0.5 mg/dL (ref 0.2–1.2)
Total Protein: 6.6 g/dL (ref 6.1–8.1)

## 2015-06-11 ENCOUNTER — Encounter: Payer: Self-pay | Admitting: Cardiovascular Disease

## 2015-07-02 ENCOUNTER — Other Ambulatory Visit: Payer: Self-pay | Admitting: Cardiovascular Disease

## 2015-07-31 ENCOUNTER — Other Ambulatory Visit: Payer: Self-pay | Admitting: Cardiovascular Disease

## 2015-10-10 ENCOUNTER — Other Ambulatory Visit: Payer: Self-pay | Admitting: Cardiovascular Disease

## 2015-11-03 ENCOUNTER — Other Ambulatory Visit: Payer: Self-pay

## 2015-11-03 MED ORDER — HYDROCHLOROTHIAZIDE 25 MG PO TABS: ORAL_TABLET | ORAL | Status: DC

## 2015-11-03 NOTE — Telephone Encounter (Signed)
Garrett Mocha, MD at 02/27/2015 9:59 PM  hydrochlorothiazide (HYDRODIURIL) 25 MG tabletTake 1 tablet (25 mg total) by mouth daily 3. Essential hypertension: Blood pressure appears to be well-controlled on Diovan, hydrochlorothiazide, and isosorbide.  Patient Instructions     Medication Instructions:  Your physician has recommended you make the following change in your medication:  1. START Atorvastatin 20mg  take one tablet by mouth every evening

## 2015-11-20 ENCOUNTER — Other Ambulatory Visit: Payer: Self-pay

## 2015-11-20 MED ORDER — ISOSORBIDE MONONITRATE ER 30 MG PO TB24: ORAL_TABLET | ORAL | 0 refills | Status: DC

## 2015-11-21 ENCOUNTER — Other Ambulatory Visit: Payer: Self-pay | Admitting: Cardiovascular Disease

## 2016-01-27 ENCOUNTER — Other Ambulatory Visit: Payer: Self-pay | Admitting: Cardiovascular Disease

## 2016-01-28 ENCOUNTER — Other Ambulatory Visit: Payer: Self-pay | Admitting: Cardiovascular Disease

## 2016-02-09 ENCOUNTER — Other Ambulatory Visit: Payer: Self-pay | Admitting: Cardiovascular Disease

## 2016-02-17 ENCOUNTER — Other Ambulatory Visit: Payer: Self-pay | Admitting: Cardiovascular Disease

## 2016-02-23 ENCOUNTER — Other Ambulatory Visit: Payer: Self-pay | Admitting: Cardiovascular Disease

## 2016-02-28 ENCOUNTER — Other Ambulatory Visit: Payer: Self-pay | Admitting: Cardiovascular Disease

## 2016-04-05 ENCOUNTER — Encounter: Payer: Self-pay | Admitting: Cardiovascular Disease

## 2016-04-05 NOTE — Telephone Encounter (Signed)
New message    Pt wants another office visit date with Dr.Cooper he was called into [place]

## 2016-04-05 NOTE — Telephone Encounter (Signed)
This encounter was created in error - please disregard.

## 2016-04-12 ENCOUNTER — Other Ambulatory Visit: Payer: Self-pay | Admitting: Cardiovascular Disease

## 2016-04-14 ENCOUNTER — Encounter: Payer: Self-pay | Admitting: Cardiovascular Disease

## 2016-04-14 ENCOUNTER — Ambulatory Visit (INDEPENDENT_AMBULATORY_CARE_PROVIDER_SITE_OTHER): Payer: BLUE CROSS/BLUE SHIELD | Admitting: Cardiovascular Disease

## 2016-04-14 VITALS — BP 152/70 | HR 50 | Ht 75.0 in | Wt 371.0 lb

## 2016-04-14 DIAGNOSIS — I2510 Atherosclerotic heart disease of native coronary artery without angina pectoris: Secondary | ICD-10-CM | POA: Diagnosis not present

## 2016-04-14 DIAGNOSIS — I10 Essential (primary) hypertension: Secondary | ICD-10-CM | POA: Diagnosis not present

## 2016-04-14 DIAGNOSIS — E785 Hyperlipidemia, unspecified: Secondary | ICD-10-CM | POA: Diagnosis not present

## 2016-04-14 MED ORDER — VALSARTAN 160 MG PO TABS: 160.0000 mg | ORAL_TABLET | Freq: Every day | ORAL | 11 refills | Status: DC

## 2016-04-14 MED ORDER — ISOSORBIDE MONONITRATE ER 30 MG PO TB24: ORAL_TABLET | ORAL | 11 refills | Status: DC

## 2016-04-14 MED ORDER — ROSUVASTATIN CALCIUM 5 MG PO TABS: 5.0000 mg | ORAL_TABLET | Freq: Every day | ORAL | 11 refills | Status: DC

## 2016-04-14 MED ORDER — HYDROCHLOROTHIAZIDE 25 MG PO TABS: 25.0000 mg | ORAL_TABLET | Freq: Every day | ORAL | 11 refills | Status: DC

## 2016-04-14 NOTE — Patient Instructions (Signed)
Medication Instructions:  Your physician has recommended you make the following change in your medication:  1. INCREASE Valsartan to 160mg  take one tablet by mouth daily  Labwork: No new orders.   Testing/Procedures: No new orders.   Follow-Up: Your physician wants you to follow-up in: 1 YEAR with Dr Burt Knack.  You will receive a reminder letter in the mail two months in advance. If you don't receive a letter, please call our office to schedule the follow-up appointment.   Any Other Special Instructions Will Be Listed Below (If Applicable).     If you need a refill on your cardiac medications before your next appointment, please call your pharmacy.

## 2016-04-14 NOTE — Progress Notes (Signed)
Cardiology Office Note Date:  04/14/2016   ID:  Gianna Spranger, DOB Oct 21, 1950, MRN [id number]  PCP:  Tempie Hoist, MD  Cardiologist:  Sherren Mocha, MD    Chief Complaint  Patient presents with  . Coronary Artery Disease     History of Present Illness: Garrett Welch is a 65 y.o. male who presents for follow-up of coronary artery disease. The patient was initially seen in June after an abnormal nuclear stress test. He underwent cardiac catheterization demonstrating total occlusion of the LAD at the ostium with collaterals. Medical therapy was recommended. The patient has been completely asymptomatic.   He is walking 15-20 minutes on the treadmill 3 days/week without exertional symptoms.   The patient is doing well. He's gained weight, but otherwise has no complaints. He specifically denies chest pain, chest pressure, or shortness of breath. No edema, orthopnea, PND, or heart palpitations.   Past Medical History:  Diagnosis Date  . Cancer ([place])   . Diabetes mellitus without complication ([place])   . Prostate cancer ([place])   . Sleep apnea     Past Surgical History:  Procedure Laterality Date  . GASTRIC BYPASS      Current Outpatient Prescriptions  Medication Sig Dispense Refill  . acetaminophen (TYLENOL) 500 MG tablet Take 1,000 mg by mouth every 8 (eight) hours as needed (pain).    Marland Kitchen albuterol (PROVENTIL HFA;VENTOLIN HFA) 108 (90 Base) MCG/ACT inhaler Inhale 2 puffs into the lungs every 6 (six) hours as needed for wheezing or shortness of breath.    Marland Kitchen albuterol (PROVENTIL) (2.5 MG/3ML) 0.083% nebulizer solution Take 2.5 mg by nebulization every 6 (six) hours as needed for wheezing or shortness of breath.     Marland Kitchen aspirin EC 81 MG tablet Take 81 mg by mouth daily.    . ferrous sulfate 325 (65 FE) MG EC tablet Take 325 mg by mouth 2 (two) times daily.     . fluticasone (FLONASE) 50 MCG/ACT nasal spray Place 1 spray into both nostrils daily.    .  hydrochlorothiazide (HYDRODIURIL) 25 MG tablet TAKE 1 TABLET (25 MG TOTAL) BY MOUTH DAILY. 30 tablet 0  . isosorbide mononitrate (IMDUR) 30 MG 24 hr tablet TAKE ONE TABLET (30 MG TOTAL) BY MOUTH DAILY. 90 tablet 0  . metFORMIN (GLUCOPHAGE) 500 MG tablet Take 500 mg by mouth 2 (two) times daily.  9  . Multiple Vitamins-Minerals (MULTIVITAMIN WITH MINERALS) tablet Take 1 tablet by mouth daily.    . naproxen sodium (ANAPROX) 220 MG tablet Take 220 mg by mouth daily as needed (pain).    Marland Kitchen omega-3 fish oil (MAXEPA) 1000 MG CAPS capsule Take 2 g by mouth daily.    . pramipexole (MIRAPEX) 0.125 MG tablet Take 0.125 mg by mouth at bedtime.  2  . rosuvastatin (CRESTOR) 5 MG tablet Take 1 tablet (5 mg total) by mouth daily. 30 tablet 2  . sodium fluoride (PREVIDENT 5000 PLUS) 1.1 % CREA dental cream Take 1 application by mouth at bedtime.    . tamsulosin (FLOMAX) 0.4 MG CAPS capsule Take 0.4 mg by mouth daily.  3  . valsartan (DIOVAN) 80 MG tablet TAKE 1 TAB BY MOUTH DAILY. MAKE APPOINTMENT FOR REFILLS 30 tablet 6   No current facility-administered medications for this visit.     Allergies:   Tape   Social History:  The patient  reports that he has never smoked. He does not have any smokeless tobacco history on file. He reports that he drinks  alcohol.   Family History:  The patient's  family history includes Cancer in his father; Heart attack in his mother; Heart disease in his mother; Heart failure in his mother; Hypertension in his maternal grandmother and mother; Kidney failure in his father; Stroke in his father.    ROS:  Please see the history of present illness.  Otherwise, review of systems is positive for fatigue and hearing loss.  All other systems are reviewed and negative.    PHYSICAL EXAM: VS:  BP (!) 152/70   Pulse (!) 50   Ht 6\' 3"  (1.905 m)   Wt (!) 371 lb (168.3 kg)   BMI 46.37 kg/m  , BMI Body mass index is 46.37 kg/m. GEN: pleasant obese male in no acute distress  HEENT:  normal  Neck: no JVD, no masses. No carotid bruits Cardiac: RRR without murmur or gallop                Respiratory:  clear to auscultation bilaterally, normal work of breathing GI: soft, nontender, nondistended, + BS MS: no deformity or atrophy  Ext: no pretibial edema, pedal pulses 2+= bilaterally Skin: warm and dry, no rash Neuro:  Strength and sensation are intact Psych: euthymic mood, full affect  EKG:  EKG is ordered today. The ekg ordered today shows sinus bradycardia 51 bpm, PACs, left anterior fascicular block, cannot rule out age-indeterminate anterior MI.  Recent Labs: 06/05/2015: ALT 30   Lipid Panel     Component Value Date/Time   CHOL 141 06/05/2015 0855   TRIG 65 06/05/2015 0855   HDL 47 06/05/2015 0855   CHOLHDL 3.0 06/05/2015 0855   VLDL 13 06/05/2015 0855   LDLCALC 81 06/05/2015 0855      Wt Readings from Last 3 Encounters:  04/14/16 (!) 371 lb (168.3 kg)  02/28/15 (!) 357 lb (161.9 kg)  11/14/14 (!) 368 lb 2.7 oz (167 kg)     Cardiac Studies Reviewed: Cardiac Cath 09/20/2014: FINDINGS: 1. Left Main: Patent. 2. LAD: Occluded in the ostial portion 3. Circumflex: The circumflex comes off the right coronary cusp adjacent to the right coronary artery. There is a 40-50% stenosis in the midportion. There is a intermediate ramus artery which is widely patent comes off the left main coronary artery 4. Right Coronary Artery: Widely patent. The RCA is large. The PDA and PLV branches are widely patent. The RCA supplies good collateral flow to the mid and distal LAD  5. Hemodynamics: aortic pressure is128/67 mmHg LV pressure is 139/10 mmHg  CONCLUSIONS: 1. 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 Plan medical therapy  24 hour Holter: 24 hour Holter recording beginning 10/04/14.  1.The patient remained in sinus rhythm  throughout with brief exceptions as noted below.Heart rate transiently fell as low as 33 bpm during sleeping hours.Average heart rate was 65 bpm with maximum rate of 103 bpm.  2.A single PVC was recorded.  3.There were frequent premature atrial contractions often occurring in a bigeminal rhythm.11,861 PACs were counted.There were 86 runs of supraventricular ectopic beats of 3-5 beats.These had slow rates ranging from 61 bpm to 98 beats per minute.  4.There appeared to be some sinus node suppression during sleeping hours with pauses after PACs up to about 2 seconds.There may have been at least one nonconducted PAC but it is difficult to determine this from the tracing.  5.There were no significant  ST-T abnormalities.    Conclusions: Abnormal Holter recording with very frequent premature atrial ectopic beats as described above.  ASSESSMENT AND PLAN: 1.  CAD, native vessel, without angina: The patient appears stable. He will continue his current medical program. We discussed the importance of diet and exercise. He has stroke weight his entire life but is motivated to lose weight through lifestyle modification.  2. Hyperlipidemia: Lipids reviewed. He is treated with a statin drug. Will work on lifestyle modification. Statin dose is limited by intolerance.  3. Essential HTN: uncontrolled. Lifestyle modification discussed. Will increase valsartan to 160 mg daily.  Current medicines are reviewed with the patient today.  The patient does not have concerns regarding medicines.  Labs/ tests ordered today include:  No orders of the defined types were placed in this encounter.   Disposition:   FU one year  Signed, Sherren Mocha, MD  04/14/2016 11:59 AM    [facility] [address], [place], [place]  [address] Phone: [phone]; Fax: [phone]

## 2016-05-05 ENCOUNTER — Ambulatory Visit: Payer: BLUE CROSS/BLUE SHIELD | Admitting: Cardiovascular Disease

## 2016-05-19 ENCOUNTER — Other Ambulatory Visit: Payer: Self-pay | Admitting: Cardiovascular Disease

## 2016-05-19 NOTE — Telephone Encounter (Signed)
Medication Detail    Disp Refills Start End   isosorbide mononitrate (IMDUR) 30 MG 24 hr tablet 30 tablet 11 04/14/2016    Sig: TAKE ONE TABLET (30 MG TOTAL) BY MOUTH DAILY.   E-Prescribing Status: Receipt confirmed by pharmacy (04/14/2016 12:13 PM EST)   Pharmacy   [id number] - [place], [place] - [address]

## 2016-05-31 ENCOUNTER — Other Ambulatory Visit: Payer: Self-pay | Admitting: Cardiovascular Disease

## 2016-06-02 NOTE — Telephone Encounter (Signed)
Medication Detail    Disp Refills Start End   isosorbide mononitrate (IMDUR) 30 MG 24 hr tablet 30 tablet 11 04/14/2016    Sig: TAKE ONE TABLET (30 MG TOTAL) BY MOUTH DAILY.   E-Prescribing Status: Receipt confirmed by pharmacy (04/14/2016 12:13 PM EST)   Pharmacy   [id number] - [place], [place] - [address]

## 2016-12-07 ENCOUNTER — Telehealth: Payer: Self-pay | Admitting: Cardiovascular Disease

## 2016-12-07 MED ORDER — IRBESARTAN 150 MG PO TABS: 150.0000 mg | ORAL_TABLET | Freq: Every day | ORAL | 11 refills | Status: DC

## 2016-12-07 NOTE — Telephone Encounter (Signed)
Confirmed with patient he is taking Valsartan 160 mg daily. Per Valsartan Recall Protocol, instructed patient to STOP VALSARTAN and START IRBESARTAN 150 mg daily. Patient will monitor BP for a couple weeks and will call with results. Patient was grateful for call and agrees with treatment plan.

## 2016-12-07 NOTE — Telephone Encounter (Signed)
New Message ° °Pt c/o medication issue: ° °1. Name of Medication: valsartan  ° °2. How are you currently taking this medication (dosage and times per day)? 160mg  ° °3. Are you having a reaction (difficulty breathing--STAT)? No  ° °4. What is your medication issue? recall ° °

## 2016-12-16 ENCOUNTER — Telehealth: Payer: Self-pay | Admitting: Cardiovascular Disease

## 2016-12-16 NOTE — Telephone Encounter (Signed)
New message    Since change in medication this is his bp reading, he was to let Lauren Know   1. What are your last 5 BP readings? 122/72

## 2016-12-16 NOTE — Telephone Encounter (Signed)
Pt called into the office to give BP reading since switching from valsartan to irbesartan.  I advised the pt that BP looks good and he should continue current medication.  I also arranged routine follow-up appointment with Dr Burt Knack in December.

## 2017-02-24 ENCOUNTER — Other Ambulatory Visit: Payer: Self-pay | Admitting: Cardiovascular Disease

## 2017-02-24 MED ORDER — HYDROCHLOROTHIAZIDE 25 MG PO TABS: 25.0000 mg | ORAL_TABLET | Freq: Every day | ORAL | 1 refills | Status: DC

## 2017-02-25 MED ORDER — HYDROCHLOROTHIAZIDE 25 MG PO TABS: 25.0000 mg | ORAL_TABLET | Freq: Every day | ORAL | 0 refills | Status: DC

## 2017-02-25 NOTE — Telephone Encounter (Signed)
Received fax from [facility] requesting that the rx for hctz be changed to #90.

## 2017-02-25 NOTE — Addendum Note (Signed)
Addended by: Juventino Slovak on: 02/25/2017 01:28 PM   Modules accepted: Orders

## 2017-04-11 ENCOUNTER — Encounter: Payer: Self-pay | Admitting: Cardiovascular Disease

## 2017-04-11 ENCOUNTER — Ambulatory Visit (INDEPENDENT_AMBULATORY_CARE_PROVIDER_SITE_OTHER): Payer: BLUE CROSS/BLUE SHIELD | Admitting: Cardiovascular Disease

## 2017-04-11 VITALS — BP 138/74 | HR 65 | Ht 75.0 in | Wt 367.8 lb

## 2017-04-11 DIAGNOSIS — I10 Essential (primary) hypertension: Secondary | ICD-10-CM

## 2017-04-11 DIAGNOSIS — I2510 Atherosclerotic heart disease of native coronary artery without angina pectoris: Secondary | ICD-10-CM

## 2017-04-11 DIAGNOSIS — E785 Hyperlipidemia, unspecified: Secondary | ICD-10-CM | POA: Diagnosis not present

## 2017-04-11 MED ORDER — ROSUVASTATIN CALCIUM 10 MG PO TABS: 10.0000 mg | ORAL_TABLET | Freq: Every day | ORAL | 3 refills | Status: DC

## 2017-04-11 NOTE — Patient Instructions (Signed)
Medication Instructions:  1) INCREASE CRESTOR to 10 mg daily  Labwork: None ordered.  Testing/Procedures: Your provider has requested that you have an exercise tolerance test. For further information please visit [email or web address]. Please also follow instruction sheet, as given.  Follow-Up: Your provider wants you to follow-up in: 1 year with Dr. Burt Knack. You will receive a reminder letter in the mail two months in advance. If you don't receive a letter, please call our office to schedule the follow-up appointment.    Any Other Special Instructions Will Be Listed Below (If Applicable).     If you need a refill on your cardiac medications before your next appointment, please call your pharmacy.

## 2017-04-11 NOTE — Progress Notes (Signed)
Cardiology Office Note Date:  04/11/2017   ID:  Garrett Welch, DOB September 28, 1950, MRN [id number]  PCP:  Garrett Hoist, MD  Cardiologist:  Sherren Mocha, MD    Chief Complaint  Patient presents with  . Follow-up    Hyperlipidemia  . Coronary Artery Disease     History of Present Illness: Garrett Welch is a 66 y.o. male who presents for follow-up of coronary artery disease.  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  His LV function has been normal.  His maximal weight prior to gastric bypass was 437# and his lowest weight is around 300#.   The patient is here alone today.  He reports no changes in his symptoms.  He is exercising on a regular basis with no exertional symptoms.  He walks on the treadmill for 20 minutes and also does some light weight lifting.  He specifically denies chest pain, chest pressure, shortness of breath, heart palpitations, orthopnea, or PND.  He has no lightheadedness or syncope.  He has chronic leg edema with no recent change.  Past Medical History:  Diagnosis Date  . Cancer ([place])   . Diabetes mellitus without complication ([place])   . Prostate cancer ([place])   . Sleep apnea     Past Surgical History:  Procedure Laterality Date  . GASTRIC BYPASS      Current Outpatient Medications  Medication Sig Dispense Refill  . acetaminophen (TYLENOL) 500 MG tablet Take 1,000 mg by mouth every 8 (eight) hours as needed (pain).    Marland Kitchen albuterol (PROVENTIL HFA;VENTOLIN HFA) 108 (90 Base) MCG/ACT inhaler Inhale 2 puffs into the lungs every 6 (six) hours as needed for wheezing or shortness of breath.    Marland Kitchen albuterol (PROVENTIL) (2.5 MG/3ML) 0.083% nebulizer solution Take 2.5 mg by nebulization every 6 (six) hours as needed for  wheezing or shortness of breath.     Marland Kitchen aspirin EC 81 MG tablet Take 81 mg by mouth daily.    . ferrous sulfate 325 (65 FE) MG EC tablet Take 325 mg by mouth 2 (two) times daily.     . fluticasone (FLONASE) 50 MCG/ACT nasal spray Place 1 spray into both nostrils daily as needed for allergies.     . hydrochlorothiazide (HYDRODIURIL) 25 MG tablet Take 1 tablet (25 mg total) by mouth daily. Please keep upcoming appt for future refills. Thanks 90 tablet 0  . irbesartan (AVAPRO) 150 MG tablet Take 1 tablet (150 mg total) by mouth daily. 30 tablet 11  . isosorbide mononitrate (IMDUR) 30 MG 24 hr tablet TAKE ONE TABLET (30 MG TOTAL) BY MOUTH DAILY. 30 tablet 11  . metFORMIN (GLUCOPHAGE) 500 MG tablet Take 1,000 mg by mouth daily with breakfast. Take 500mg  by mouth in the evening.  9  . Multiple Vitamins-Minerals (MULTIVITAMIN WITH MINERALS) tablet Take 1 tablet by mouth daily.    . naproxen sodium (ANAPROX) 220 MG tablet Take 220 mg by mouth daily as needed (pain).    Marland Kitchen omega-3 fish oil (MAXEPA) 1000 MG CAPS capsule Take 2 g by mouth daily.    . pramipexole (MIRAPEX) 0.125 MG tablet Take 0.125 mg by mouth at bedtime.  2  . rosuvastatin (CRESTOR) 10 MG tablet Take 1 tablet (10 mg total) by mouth daily. 90 tablet  3  . sodium fluoride (PREVIDENT 5000 PLUS) 1.1 % CREA dental cream Take 1 application by mouth at bedtime.    . tamsulosin (FLOMAX) 0.4 MG CAPS capsule Take 0.4 mg by mouth daily.  3  . TURMERIC PO Take 1 tablet by mouth daily.     No current facility-administered medications for this visit.     Allergies:   Tape   Social History:  The patient  reports that  has never smoked. he has never used smokeless tobacco. He reports that he drinks alcohol.   Family History:  The patient's family history includes Cancer in his father; Heart attack in his mother; Heart disease in his mother; Heart failure in his mother; Hypertension in his maternal grandmother and mother; Kidney failure in his father;  Stroke in his father.   ROS:  Please see the history of present illness.  All other systems are reviewed and negative.   PHYSICAL EXAM: VS:  BP 138/74   Pulse 65   Ht 6\' 3"  (1.905 m)   Wt (!) 367 lb 12.8 oz (166.8 kg)   BMI 45.97 kg/m  , BMI Body mass index is 45.97 kg/m. GEN: Well nourished, well developed, in no acute distress  HEENT: normal  Neck: no JVD, no masses. No carotid bruits Cardiac: RRR with grade 2/6 SEM at the RUSB               Respiratory:  clear to auscultation bilaterally, normal work of breathing GI: soft, nontender, nondistended, + BS MS: no deformity or atrophy  Ext: no pretibial edema, pedal pulses 2+= bilaterally Skin: warm and dry, no rash Neuro:  Strength and sensation are intact Psych: euthymic mood, full affect  EKG:  EKG is ordered today. The ekg ordered today shows normal sinus rhythm with frequent PACs, left anterior fascicular block, ST/T wave abnormality consider anterior ischemia.  Heart rate is 65 bpm.  Recent Labs: No results found for requested labs within last 8760 hours.   Lipid Panel     Component Value Date/Time   CHOL 141 06/05/2015 0855   TRIG 65 06/05/2015 0855   HDL 47 06/05/2015 0855   CHOLHDL 3.0 06/05/2015 0855   VLDL 13 06/05/2015 0855   LDLCALC 81 06/05/2015 0855      Wt Readings from Last 3 Encounters:  04/11/17 (!) 367 lb 12.8 oz (166.8 kg)  04/14/16 (!) 371 lb (168.3 kg)  02/28/15 (!) 357 lb (161.9 kg)     Cardiac Studies Reviewed: Cardiac Cath 09-20-2014: FINDINGS: 1. Left Main: Patent. 2. LAD: Occluded in the ostial portion 3. Circumflex: The circumflex comes off the right coronary cusp adjacent to the right coronary artery. There is a 40-50% stenosis in the midportion. There is a intermediate ramus artery which is widely patent comes off the left main coronary artery 4. Right Coronary Artery: Widely patent. The RCA is large. The PDA and PLV branches are widely patent. The RCA supplies good collateral flow to  the mid and distal LAD aortic pressure is   5. Hemodynamics: aortic pressure is128/67 mmHg LV pressure is 139/10 mmHg      CONCLUSIONS: 1. 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 Plan medical therapy  ASSESSMENT AND PLAN: 1.  CAD, native vessel, without angina: The patient does not appear to have any new symptoms of anginal chest pain or progressive dyspnea.  However, with his  known chronic occlusion of the proximal LAD and anomalous circumflex, I think surveillance stress testing is indicated.  I have recommended a non-imaging exercise treadmill study.  He will continue his current anti-anginal medications without change.  2.  Hypertension: Blood pressure is controlled on multidrug therapy.  Medications are reviewed today.  Lifestyle modification is discussed.  3.  Hyperlipidemia: Lipids are reviewed.  Most recent LDL cholesterol is approximately 90 mg/dL.  I recommended increasing Crestor to 10 mg daily.  Reviewed his past history and he has had myalgias with high intensity statin drugs in the past.  4.  Type 2 diabetes: Followed by his primary physician.  Discussed weight loss measures.  Discussed dietary changes as well.  5. Morbid obesity BMI > 40: counseling done. Longstanding issue - see discussion above.  Current medicines are reviewed with the patient today.  The patient does not have concerns regarding medicines.  Labs/ tests ordered today include:   Orders Placed This Encounter  Procedures  . EXERCISE TOLERANCE TEST (ETT)  . EKG 12-Lead    Disposition:   FU one year  Signed, Sherren Mocha, MD  04/11/2017 12:51 PM    [facility] [place], [place], [place]  [address] Phone: [phone]; Fax: [phone]

## 2017-05-25 ENCOUNTER — Other Ambulatory Visit: Payer: Self-pay | Admitting: Cardiovascular Disease

## 2017-05-25 NOTE — Telephone Encounter (Signed)
Medication Detail    Disp Refills Start End   rosuvastatin (CRESTOR) 10 MG tablet 90 tablet 3 04/11/2017 04/06/2018   Sig - Route: Take 1 tablet (10 mg total) by mouth daily. - Oral   Sent to pharmacy as: rosuvastatin (CRESTOR) 10 MG tablet   E-Prescribing Status: Receipt confirmed by pharmacy (04/11/2017 9:03 AM EST)   Pharmacy   [id number] - [place], [place] - [address]

## 2017-06-03 ENCOUNTER — Other Ambulatory Visit: Payer: Self-pay | Admitting: Cardiovascular Disease

## 2017-07-27 ENCOUNTER — Other Ambulatory Visit: Payer: Self-pay | Admitting: Cardiovascular Disease

## 2017-09-16 ENCOUNTER — Encounter (HOSPITAL_COMMUNITY): Payer: Self-pay | Admitting: Emergency Medicine

## 2017-09-16 ENCOUNTER — Ambulatory Visit: Payer: BLUE CROSS/BLUE SHIELD

## 2017-09-16 ENCOUNTER — Other Ambulatory Visit: Payer: Self-pay

## 2017-09-16 ENCOUNTER — Emergency Department (HOSPITAL_COMMUNITY)
Admission: EM | Admit: 2017-09-16 | Discharge: 2017-09-16 | Disposition: A | Discharge status: Home / Self Care | Payer: BLUE CROSS/BLUE SHIELD | Attending: Emergency Medicine | Admitting: Emergency Medicine

## 2017-09-16 ENCOUNTER — Telehealth: Payer: Self-pay

## 2017-09-16 ENCOUNTER — Emergency Department (HOSPITAL_COMMUNITY): Payer: BLUE CROSS/BLUE SHIELD

## 2017-09-16 ENCOUNTER — Other Ambulatory Visit: Payer: Self-pay | Admitting: Physician Assistant

## 2017-09-16 DIAGNOSIS — I4892 Unspecified atrial flutter: Secondary | ICD-10-CM | POA: Diagnosis not present

## 2017-09-16 DIAGNOSIS — I10 Essential (primary) hypertension: Secondary | ICD-10-CM | POA: Diagnosis present

## 2017-09-16 DIAGNOSIS — I483 Typical atrial flutter: Secondary | ICD-10-CM

## 2017-09-16 DIAGNOSIS — Z79899 Other long term (current) drug therapy: Secondary | ICD-10-CM | POA: Insufficient documentation

## 2017-09-16 DIAGNOSIS — E119 Type 2 diabetes mellitus without complications: Secondary | ICD-10-CM | POA: Diagnosis not present

## 2017-09-16 DIAGNOSIS — I959 Hypotension, unspecified: Secondary | ICD-10-CM | POA: Diagnosis present

## 2017-09-16 DIAGNOSIS — R000 Tachycardia, unspecified: Secondary | ICD-10-CM | POA: Diagnosis present

## 2017-09-16 DIAGNOSIS — I4891 Unspecified atrial fibrillation: Secondary | ICD-10-CM

## 2017-09-16 DIAGNOSIS — Z7984 Long term (current) use of oral hypoglycemic drugs: Secondary | ICD-10-CM | POA: Insufficient documentation

## 2017-09-16 HISTORY — DX: Unspecified atrial flutter: I48.92

## 2017-09-16 HISTORY — DX: Essential (primary) hypertension: I10

## 2017-09-16 HISTORY — DX: Hyperlipidemia, unspecified: E78.5

## 2017-09-16 HISTORY — DX: Unspecified osteoarthritis, unspecified site: M19.90

## 2017-09-16 HISTORY — DX: Atrial premature depolarization: I49.1

## 2017-09-16 LAB — CBC WITH DIFFERENTIAL/PLATELET
Abs Immature Granulocytes: 0 10*3/uL (ref 0.0–0.1)
BASOS ABS: 0.1 10*3/uL (ref 0.0–0.1)
Basophils Relative: 1 %
EOS PCT: 4 %
Eosinophils Absolute: 0.4 10*3/uL (ref 0.0–0.7)
HEMATOCRIT: 44 % (ref 39.0–52.0)
HEMOGLOBIN: 14.4 g/dL (ref 13.0–17.0)
Immature Granulocytes: 0 %
LYMPHS ABS: 1.6 10*3/uL (ref 0.7–4.0)
LYMPHS PCT: 18 %
MCH: 30.9 pg (ref 26.0–34.0)
MCHC: 32.7 g/dL (ref 30.0–36.0)
MCV: 94.4 fL (ref 78.0–100.0)
MONO ABS: 0.8 10*3/uL (ref 0.1–1.0)
MONOS PCT: 9 %
NEUTROS ABS: 6.3 10*3/uL (ref 1.7–7.7)
NEUTROS PCT: 68 %
Platelets: 272 10*3/uL (ref 150–400)
RBC: 4.66 MIL/uL (ref 4.22–5.81)
RDW: 12.7 % (ref 11.5–15.5)
WBC: 9.1 10*3/uL (ref 4.0–10.5)

## 2017-09-16 LAB — I-STAT CHEM 8, ED
BUN: 19 mg/dL (ref 6–20)
CALCIUM ION: 1.17 mmol/L (ref 1.15–1.40)
CREATININE: 0.9 mg/dL (ref 0.61–1.24)
Chloride: 103 mmol/L (ref 101–111)
GLUCOSE: 124 mg/dL — AB (ref 65–99)
HCT: 42 % (ref 39.0–52.0)
Hemoglobin: 14.3 g/dL (ref 13.0–17.0)
Potassium: 4 mmol/L (ref 3.5–5.1)
Sodium: 139 mmol/L (ref 135–145)
TCO2: 23 mmol/L (ref 22–32)

## 2017-09-16 LAB — TSH: TSH: 1.622 u[IU]/mL (ref 0.350–4.500)

## 2017-09-16 MED ORDER — METOPROLOL TARTRATE 25 MG PO TABS
25.0000 mg | ORAL_TABLET | Freq: Two times a day (BID) | ORAL | Status: DC
  Administered 2017-09-16: 25 mg via ORAL
  Filled 2017-09-16: qty 1

## 2017-09-16 MED ORDER — APIXABAN 5 MG PO TABS: 5.0000 mg | ORAL_TABLET | Freq: Two times a day (BID) | ORAL | 11 refills | Status: DC

## 2017-09-16 MED ORDER — METOPROLOL TARTRATE 50 MG PO TABS: 50.0000 mg | ORAL_TABLET | Freq: Two times a day (BID) | ORAL | 6 refills | Status: DC

## 2017-09-16 MED ORDER — APIXABAN 5 MG PO TABS
5.0000 mg | ORAL_TABLET | Freq: Two times a day (BID) | ORAL | Status: DC
  Administered 2017-09-16: 5 mg via ORAL
  Filled 2017-09-16 (×2): qty 1

## 2017-09-16 MED ORDER — APIXABAN 5 MG PO TABS: 5.0000 mg | ORAL_TABLET | Freq: Two times a day (BID) | ORAL | 0 refills | Status: DC

## 2017-09-16 MED ORDER — IRBESARTAN 75 MG PO TABS: 75.0000 mg | ORAL_TABLET | Freq: Every day | ORAL | 6 refills | Status: DC

## 2017-09-16 NOTE — ED Provider Notes (Signed)
Pt signed out by Dr. Winfred Leeds awaiting response to metoprolol.  The pt's HR has decreased, but is still 120s.  Pt does not feel any sob or cp with it.  He feels normal.  Cardiology is ok with him going home with eliquis.  Chadvasc score is 3 for age, htn, dm.  Pt does not want to stay and is ok with the plan to go home.  Cardiology has set pt up with an appt in the afib clinic as well as an outpatient echo.  Pt is given strict instructions to return if he does become symptomatic.         Isla Pence, MD 09/16/17 [phone]

## 2017-09-16 NOTE — Discharge Instructions (Addendum)
Please obtain a BP cuff and check your blood pressure daily, at various times of the day.  Please record these and bring them to your office visits.  You will also get a heart rate, please record this as well.  If your top blood pressure number is under 100, hold your next dose of irbesartan.

## 2017-09-16 NOTE — Telephone Encounter (Signed)
Patient presented to the office today for routine ETT. Patient has a known chronic occlusion of the proximal LAD and anomalous circumflex. Last seen by Dr. Burt Knack on 04/11/17.   Per Darrick Penna, treadmill technician:  ---Patient arrived to appointment for ETT and was connected to the monitor and was found to be in atrial flutter with a rate of 134. Patient's BP 76/55. Patient asymptomatic. Patient with no Hx of atrial flutter and is not anticoagulated. DOD was consulted and instructions were given to contact EMS for non-emergent transport to the hospital for rate control and anticoagulation. Triage contacted to arrange transport.---   I contacted EMS and went to be with patient until EMS arrived. Patient remained completely asymptomatic. Follow-up BP prior to leaving was 104/77. DOD contacted Pecolia Ades, NP who is covering Trish at the hospital to make aware of patient's arrival and plan.

## 2017-09-16 NOTE — ED Notes (Signed)
ED Provider at bedside. 

## 2017-09-16 NOTE — ED Provider Notes (Addendum)
[facility] EMERGENCY DEPARTMENT Provider Note   CSN: [id number] Arrival date & time: 09/16/17  1031     History   Chief Complaint Chief Complaint  Patient presents with  . Irregular Heart Beat    HPI Garrett Welch is a 67 y.o. male.  HPI Patient went for routine transient esophageal echocardiogram this morning as outpatient. He was noted to have rapid heartbeat. Sent here for further evaluation. No treatment prior to coming here. He denies shortness of breath chest pain lightheadedness. He is asymptomatic. Past Medical History:  Diagnosis Date  . Cancer ([place])   . Diabetes mellitus without complication ([place])   . Prostate cancer ([place])   . Sleep apnea     There are no active problems to display for this patient.   Past Surgical History:  Procedure Laterality Date  . GASTRIC BYPASS          Home Medications    Prior to Admission medications   Medication Sig Start Date End Date Taking? Authorizing Provider  acetaminophen (TYLENOL) 500 MG tablet Take 1,000 mg by mouth every 8 (eight) hours as needed (pain).    [provider]  albuterol (PROVENTIL HFA;VENTOLIN HFA) 108 (90 Base) MCG/ACT inhaler Inhale 2 puffs into the lungs every 6 (six) hours as needed for wheezing or shortness of breath.    [provider]  albuterol (PROVENTIL) (2.5 MG/3ML) 0.083% nebulizer solution Take 2.5 mg by nebulization every 6 (six) hours as needed for wheezing or shortness of breath.     [provider]  aspirin EC 81 MG tablet Take 81 mg by mouth daily.    [provider]  ferrous sulfate 325 (65 FE) MG EC tablet Take 325 mg by mouth 2 (two) times daily.  01/03/14   [provider]  fluticasone (FLONASE) 50 MCG/ACT nasal spray Place 1 spray into both nostrils daily as needed for allergies.     [provider]  hydrochlorothiazide (HYDRODIURIL) 25 MG tablet TAKE 1 TABLET (25 MG TOTAL) BY MOUTH DAILY. PLEASE  KEEP UPCOMING APPT FOR FUTURE REFILLS. THANKS 07/27/17   Sherren Mocha, MD  irbesartan (AVAPRO) 150 MG tablet Take 1 tablet (150 mg total) by mouth daily. 12/07/16   Sherren Mocha, MD  isosorbide mononitrate (IMDUR) 30 MG 24 hr tablet TAKE ONE TABLET (30 MG TOTAL) BY MOUTH DAILY. 06/03/17   Sherren Mocha, MD  metFORMIN (GLUCOPHAGE) 500 MG tablet Take 1,000 mg by mouth daily with breakfast. Take 500mg  by mouth in the evening. 09/16/14   [provider]  Multiple Vitamins-Minerals (MULTIVITAMIN WITH MINERALS) tablet Take 1 tablet by mouth daily.    [provider]  naproxen sodium (ANAPROX) 220 MG tablet Take 220 mg by mouth daily as needed (pain).    [provider]  omega-3 fish oil (MAXEPA) 1000 MG CAPS capsule Take 2 g by mouth daily.    [provider]  pramipexole (MIRAPEX) 0.125 MG tablet Take 0.125 mg by mouth at bedtime. 08/27/14   [provider]  rosuvastatin (CRESTOR) 10 MG tablet Take 1 tablet (10 mg total) by mouth daily. 04/11/17 04/06/18  Sherren Mocha, MD  sodium fluoride (PREVIDENT 5000 PLUS) 1.1 % CREA dental cream Take 1 application by mouth at bedtime. 07/01/14   [provider]  tamsulosin (FLOMAX) 0.4 MG CAPS capsule Take 0.4 mg by mouth daily. 09/18/14   [provider]  TURMERIC PO Take 1 tablet by mouth daily.    [provider]  Family History Family History  Problem Relation Age of Onset  . Heart failure Mother   . Heart disease Mother   . Heart attack Mother   . Hypertension Mother   . Stroke Father   . Kidney failure Father   . Cancer Father   . Hypertension Maternal Grandmother     Social History Social History   Tobacco Use  . Smoking status: Never Smoker  . Smokeless tobacco: Never Used  Substance Use Topics  . Alcohol use: Yes    Comment: occ  . Drug use: Not on file     Allergies   Tape   Review of Systems Review of Systems  Constitutional: Negative.   HENT: Negative.    Respiratory: Negative.   Cardiovascular: Negative.   Gastrointestinal: Negative.   Musculoskeletal: Negative.   Skin: Negative.   Allergic/Immunologic: Positive for immunocompromised state.       Diabetic  Neurological: Negative.   Psychiatric/Behavioral: Negative.   All other systems reviewed and are negative.    Physical Exam Updated Vital Signs BP 108/73 (BP Location: Left Arm)   Pulse (!) 127   Temp 98.8 F (37.1 C) (Oral)   Resp 16   Ht 6\' 3"  (1.905 m)   Wt (!) 161.5 kg (356 lb)   SpO2 94%   BMI 44.50 kg/m   Physical Exam  Constitutional: He appears well-developed and well-nourished.  HENT:  Head: Normocephalic and atraumatic.  Eyes: Pupils are equal, round, and reactive to light. Conjunctivae are normal.  Neck: Neck supple. No tracheal deviation present. No thyromegaly present.  Cardiovascular: Regular rhythm.  No murmur heard. tachycardic  Pulmonary/Chest: Effort normal and breath sounds normal.  Abdominal: Soft. Bowel sounds are normal. He exhibits no distension. There is no tenderness.  Musculoskeletal: Normal range of motion. He exhibits no edema or tenderness.  Neurological: He is alert. Coordination normal.  Skin: Skin is warm and dry. No rash noted.  Psychiatric: He has a normal mood and affect.  Nursing note and vitals reviewed.    ED Treatments / Results  Labs (all labs ordered are listed, but only abnormal results are displayed) Labs Reviewed  CBC WITH DIFFERENTIAL/PLATELET  I-STAT CHEM 8, ED    EKG EKG Interpretation  Date/Time:  Friday Sep 16 2017 10:42:40 EDT Ventricular Rate:  116 PR Interval:    QRS Duration: 107 QT Interval:  422 QTC Calculation: 587 R Axis:   -87 Text Interpretation:  Sinus tachycardia with irregular rate Left anterior fascicular block Low voltage, precordial leads Consider anterior infarct Prolonged QT interval Baseline wander in lead(s) II Welch aVF SINCE LAST TRACING HEART RATE HAS INCREASED Confirmed by  Orlie Dakin [phone]) on 09/16/2017 11:01:13 AM suspect atrial flutter with terrible block  Radiology No results found.  Procedures Procedures (including critical care time)  Medications Ordered in ED Medications - No data to display Results for orders placed or performed during the hospital encounter of 09/16/17  CBC with Differential/Platelet  Result Value Ref Range   WBC 9.1 4.0 - 10.5 K/uL   RBC 4.66 4.22 - 5.81 MIL/uL   Hemoglobin 14.4 13.0 - 17.0 g/dL   HCT 44.0 39.0 - 52.0 %   MCV 94.4 78.0 - 100.0 fL   MCH 30.9 26.0 - 34.0 pg   MCHC 32.7 30.0 - 36.0 g/dL   RDW 12.7 11.5 - 15.5 %   Platelets 272 150 - 400 K/uL   Neutrophils Relative % 68 %   Neutro Abs 6.3 1.7 - 7.7 K/uL  Lymphocytes Relative 18 %   Lymphs Abs 1.6 0.7 - 4.0 K/uL   Monocytes Relative 9 %   Monocytes Absolute 0.8 0.1 - 1.0 K/uL   Eosinophils Relative 4 %   Eosinophils Absolute 0.4 0.0 - 0.7 K/uL   Basophils Relative 1 %   Basophils Absolute 0.1 0.0 - 0.1 K/uL   Immature Granulocytes 0 %   Abs Immature Granulocytes 0.0 0.0 - 0.1 K/uL  I-stat chem 8, ed  Result Value Ref Range   Sodium 139 135 - 145 mmol/L   Potassium 4.0 3.5 - 5.1 mmol/L   Chloride 103 101 - 111 mmol/L   BUN 19 6 - 20 mg/dL   Creatinine, Ser 0.90 0.61 - 1.24 mg/dL   Glucose, Bld 124 (H) 65 - 99 mg/dL   Calcium, Ion 1.17 1.15 - 1.40 mmol/L   TCO2 23 22 - 32 mmol/L   Hemoglobin 14.3 13.0 - 17.0 g/dL   HCT 42.0 39.0 - 52.0 %   Dg Chest Port 1 View  Result Date: 09/16/2017 CLINICAL DATA:  Chest palpitations today. EXAM: PORTABLE CHEST 1 VIEW COMPARISON:  PA and lateral chest 09/29/2016. FINDINGS: The lungs are clear. Heart size is normal. Aortic atherosclerosis is noted. No pneumothorax or pleural effusion. No focal bony abnormality. IMPRESSION: No acute disease. Atherosclerosis. Electronically Signed   By: Inge Rise M.D.   On: 09/16/2017 11:32  chest xray viewed by me  Initial Impression / Assessment and Plan / ED Course   I have reviewed the triage vital signs and the nursing notes.  Pertinent labs & imaging results that were available during my care of the patient were reviewed by me and considered in my medical decision making (see chart for details).     Cardiology consulted and will evaluate patient in ED.patient asymptomatic. No emergent treatment needed patient may need rate control and anticoagulation Lab work unremarkable Final Clinical Impressions(s) / ED Diagnoses  Diagnosis: tachycardia Final diagnoses:  None    ED Discharge Orders    None       Orlie Dakin, MD 09/16/17 1155 Addendum cardiology saw patient in the emergency department. Metoprolol administered here. Prescriptions written by cardiology service.He is to be observedfor one to 2 hours. If heart rate normalizes hecan go home with follow-up . If he remains tachycardic or unstable he will be admitted by cardiology service.  Pt signed out to Dr. Gilford Raid 425 pm   Orlie Dakin, MD 09/16/17 [phone]

## 2017-09-16 NOTE — ED Notes (Signed)
Pt called by Larkin Ina, RN for a room x3.  No response.

## 2017-09-16 NOTE — ED Triage Notes (Signed)
Patient sent from heart center via [facility] for concern of atrial flutter. Patient was there for a stress test but on pre-test EKG patient found to be tachycardic in 130's and staff reports they were concerned patient was also in atrial flutter without history of atrial fibrillation. Patient denies any symptoms, specifically denying shortness of breath, chest pain, dizziness, and nausea. BP with EMS 109/73, heart rate 130. 20g saline lock in right AC. Patient alert and oriented and in no apparent distress at this time.

## 2017-09-16 NOTE — Consult Note (Signed)
Cardiology Consultation:   Patient ID: Garrett Welch; [id number]; Oct 26, 1950   Admit date: 09/16/2017 Date of Consult: 09/16/2017  Primary Care Provider: Tempie Hoist, MD Primary Cardiologist: Dr Burt Knack, 04/11/2017 Primary Electrophysiologist:  n/a   Patient Profile:   Garrett Welch is a 67 y.o. 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  History of Present Illness:   Garrett Welch came to the office today for a routine ETT. When he was hooked up, he was in rapid atrial flutter, HR 134, asymptomatic. BP 76/55>>104/77 w/ supine position.  EMS was called and pt sent to ER.   In the emergency room, his blood pressure has been generally low, 99-242 systolic.  His heart rate is greater than 115, and with minimal activity/movement will go up to 130 or 140.   Garrett Welch does not have a blood pressure cuff and check his blood pressure.  He does not have a watch that checks his heart rate.  He is completely unaware of the atrial flutter.  Specifically, he denies chest pain, shortness of breath, lightheaded or dizzy feelings, presyncope or syncope or palpitations.  He has not felt limited in his activity in any way.  He and his wife got some upper respiratory congestion when they were in [place] about a month ago.  He took a decongestant at that time but does not normally take one.  He has no history of thyroid problems.  He has not been using his albuterol recently.  He drinks about a pot of coffee over the course of the day, no recent change.  No lower extremity edema, no orthopnea or PND.  He does have nocturia, no recent change.  He is compliant with his daily medications.    This a.m., prior to coming in for the stress test, he had a light breakfast including a cup of coffee and took  his usual morning medications including HCTZ 25 mg and irbesartan 150 mg.  He also takes Imdur 30 mg a day but takes that at bedtime.  He took it last night.  His wife notes that they have been doing the keto diet and he has lost weight in the last 3 months, possibly as much as 25 pounds.  He has been following his blood sugars and his blood sugars have been okay.  However, she wonders if his blood pressure medications might need to be reduced because of the weight loss.  No history of bleeding issues.   Past Medical History:  Diagnosis Date  . Atrial flutter with rapid ventricular response ([place]) 09/16/2017  . Cancer ([place])   . Diabetes mellitus without complication ([place])   . HTN (hypertension)   . Hyperlipidemia LDL goal <70   . OA (osteoarthritis)   . PAC (premature atrial contraction)   . Prostate cancer ([place])   . Sleep apnea     Past Surgical History:  Procedure Laterality Date  . CARDIAC CATHETERIZATION  09/2014   [facility] in [place], LAD 100%, anomalous CFX 40-50%, RCA patent  . GASTRIC BYPASS       Prior to Admission medications   Medication Sig Start Date End Date Taking? Authorizing Provider  acetaminophen (TYLENOL) 500 MG tablet Take 1,000 mg by mouth every 8 (eight) hours as needed (pain).    [provider]  albuterol (  PROVENTIL HFA;VENTOLIN HFA) 108 (90 Base) MCG/ACT inhaler Inhale 2 puffs into the lungs every 6 (six) hours as needed for wheezing or shortness of breath.    [provider]  albuterol (PROVENTIL) (2.5 MG/3ML) 0.083% nebulizer solution Take 2.5 mg by nebulization every 6 (six) hours as needed for wheezing or shortness of breath.     [provider]  aspirin EC 81 MG tablet Take 81 mg by mouth daily.    [provider]  ferrous sulfate 325 (65 FE) MG EC tablet Take 325 mg by mouth 2 (two) times daily.  01/03/14   [provider]  fluticasone (FLONASE) 50 MCG/ACT nasal spray Place 1 spray into both nostrils daily as needed  for allergies.     [provider]  hydrochlorothiazide (HYDRODIURIL) 25 MG tablet TAKE 1 TABLET (25 MG TOTAL) BY MOUTH DAILY. PLEASE KEEP UPCOMING APPT FOR FUTURE REFILLS. THANKS 07/27/17   Sherren Mocha, MD  irbesartan (AVAPRO) 150 MG tablet Take 1 tablet (150 mg total) by mouth daily. 12/07/16   Sherren Mocha, MD  isosorbide mononitrate (IMDUR) 30 MG 24 hr tablet TAKE ONE TABLET (30 MG TOTAL) BY MOUTH DAILY. 06/03/17   Sherren Mocha, MD  metFORMIN (GLUCOPHAGE) 500 MG tablet Take 1,000 mg by mouth daily with breakfast. Take 500mg  by mouth in the evening. 09/16/14   [provider]  Multiple Vitamins-Minerals (MULTIVITAMIN WITH MINERALS) tablet Take 1 tablet by mouth daily.    [provider]  naproxen sodium (ANAPROX) 220 MG tablet Take 220 mg by mouth daily as needed (pain).    [provider]  omega-3 fish oil (MAXEPA) 1000 MG CAPS capsule Take 2 g by mouth daily.    [provider]  pramipexole (MIRAPEX) 0.125 MG tablet Take 0.125 mg by mouth at bedtime. 08/27/14   [provider]  rosuvastatin (CRESTOR) 10 MG tablet Take 1 tablet (10 mg total) by mouth daily. 04/11/17 04/06/18  Sherren Mocha, MD  sodium fluoride (PREVIDENT 5000 PLUS) 1.1 % CREA dental cream Take 1 application by mouth at bedtime. 07/01/14   [provider]  tamsulosin (FLOMAX) 0.4 MG CAPS capsule Take 0.4 mg by mouth daily. 09/18/14   [provider]  TURMERIC PO Take 1 tablet by mouth daily.    [provider]    Allergies:    Allergies  Allergen Reactions  . Tape Rash    Social History:   Social History   Socioeconomic History  . Marital status: Married    Spouse name: Not on file  . Number of children: Not on file  . Years of education: Not on file  . Highest education level: Not on file  Occupational History  . Occupation: Theme park manager  Social Needs  . Financial resource strain: Not on file  . Food insecurity:    Worry: Not on file     Inability: Not on file  . Transportation needs:    Medical: Not on file    Non-medical: Not on file  Tobacco Use  . Smoking status: Never Smoker  . Smokeless tobacco: Never Used  Substance and Sexual Activity  . Alcohol use: Yes    Comment: occ  . Drug use: Not on file  . Sexual activity: Not on file  Lifestyle  . Physical activity:    Days per week: Not on file    Minutes per session: Not on file  . Stress: Not on file  Relationships  . Social connections:    Talks on phone:  Not on file    Gets together: Not on file    Attends religious service: Not on file    Active member of club or organization: Not on file    Attends meetings of clubs or organizations: Not on file    Relationship status: Not on file  . Intimate partner violence:    Fear of current or ex partner: Not on file    Emotionally abused: Not on file    Physically abused: Not on file    Forced sexual activity: Not on file  Other Topics Concern  . 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     Family History:   Family History  Problem Relation Age of Onset  . Heart failure Mother   . Heart disease Mother   . Heart attack Mother   . Hypertension Mother   . Stroke Father   . Kidney failure Father   . Cancer Father   . Hypertension Maternal Grandmother    Family Status:  Family Status  Relation Name Status  . Mother  Deceased  . Father  Deceased  . MGM  (Not Specified)    ROS:  Please see the history of present illness.  All other ROS reviewed and negative.     Physical Exam/Data:   Vitals:   09/16/17 1045 09/16/17 1052 09/16/17 1053  BP: 108/73  108/73  Pulse: (!) 132  (!) 127  Resp: 18  16  Temp:   98.8 F (37.1 C)  TempSrc:   Oral  SpO2: 94%  94%  Weight:  (!) 356 lb (161.5 kg)   Height:  6\' 3"  (1.905 m)    No intake or output data in the 24 hours ending 09/16/17 1344 Filed Weights   09/16/17 1052  Weight: (!) 356 lb (161.5 kg)   Body  mass index is 44.5 kg/m.  General:  Well nourished, well developed, in no acute distress HEENT: normal Lymph: no adenopathy Neck: no JVD seen, difficult to assess 2nd body habitus Endocrine:  No thryomegaly Vascular: No carotid bruits; 4/4 extremity pulses 2+, without bruits  Cardiac:  normal S1, S2; Irreg R&R; soft murmur  Lungs:  clear to auscultation bilaterally, no wheezing, rhonchi or rales  Abd: soft, nontender, no hepatomegaly  Ext: no edema Musculoskeletal:  No deformities, BUE and BLE strength normal and equal Skin: warm and dry  Neuro:  CNs 2-12 intact, no focal abnormalities noted Psych:  Normal affect   EKG:  The EKG was personally reviewed and demonstrates: Typical Atrial flutter, HR 116 Telemetry:  Telemetry was personally reviewed and demonstrates:  Atrial fib, RVR  Relevant CV Studies:  ECHO: 09/17/14  Moderate concentric left ventricular hypertrophy with ejection fraction of 60-65%. There was grade 1 diastolic dysfunction and moderate aortic valve sclerosis.  CATH:  09/20/2014 POSTPROCEDURE DIAGNOSIS: Single vessel obstructive coronary artery disease  FINDINGS:  1. Left Main: Patent.  2. LAD: Occluded in the ostial portion  3. Circumflex: The circumflex comes off the right coronary cusp adjacent to the right coronary artery. There is a 40-50% stenosis in the midportion. There is a intermediate ramus artery which is widely patent comes off the left main coronary artery  4. Right Coronary Artery: Widely patent. The RCA is large. The PDA and PLV branches are widely patent. The RCA supplies good collateral flow to the mid and distal LAD   5. Hemodynamics: aortic pressure is128/67 mmHg  LV pressure is 139/10 mmHg  CONCLUSIONS:  1. 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 Plan medical therapy     Laboratory  Data:  Chemistry Recent Labs  Lab 09/16/17 1118  NA 139  K 4.0  CL 103  GLUCOSE 124*  BUN 19  CREATININE 0.90    Lab Results  Component Value Date   ALT 30 06/05/2015   AST 23 06/05/2015   ALKPHOS 47 06/05/2015   BILITOT 0.5 06/05/2015   Hematology Recent Labs  Lab 09/16/17 1050 09/16/17 1118  WBC 9.1  --   RBC 4.66  --   HGB 14.4 14.3  HCT 44.0 42.0  MCV 94.4  --   MCH 30.9  --   MCHC 32.7  --   RDW 12.7  --   PLT 272  --    TSH: No results found for: TSH   Lipids: Lab Results  Component Value Date   CHOL 141 06/05/2015   HDL 47 06/05/2015   LDLCALC 81 06/05/2015   TRIG 65 06/05/2015   CHOLHDL 3.0 06/05/2015    Radiology/Studies:  Dg Chest Port 1 View  Result Date: 09/16/2017 CLINICAL DATA:  Chest palpitations today. EXAM: PORTABLE CHEST 1 VIEW COMPARISON:  PA and lateral chest 09/29/2016. FINDINGS: The lungs are clear. Heart size is normal. Aortic atherosclerosis is noted. No pneumothorax or pleural effusion. No focal bony abnormality. IMPRESSION: No acute disease. Atherosclerosis. Electronically Signed   By: Inge Rise M.D.   On: 09/16/2017 11:32    Assessment and Plan:   Principal Problem: 1.  Atrial flutter with rapid ventricular response (HCC) - new dx, asymptomatic and no recent BP/HR checks, therefore, unknown duration - needs echo, anticoagulation, rate control  - possible DCCV in 3 weeks if does not spontaneously convert - f/u in Afib clinic, consider ablation if does not convert  Active Problems: 2.  HTN (hypertension) - pt took irbesartan 150 mg and HCTZ 25 mg this am - BP initially low but is improving - oral hydration, decrease irbesartan and should be able to add metoprolol 50 mg bid  3. Hypotension - likely 2nd decreased PO intake this am and HCTZ - improving, allow po intake for hydration  4. Anticoagulation - CHA2DS2-VASc = 4 (age x 1, CAD, DM, HTN) - Add Eliquis  For questions or updates, please contact [facility] Please consult [email or web address] for contact info under Cardiology/STEMI.   SignedRosaria Ferries, PA-C  09/16/2017 1:44 PM

## 2017-09-17 ENCOUNTER — Encounter: Payer: Self-pay | Admitting: Cardiovascular Disease

## 2017-09-21 ENCOUNTER — Encounter (HOSPITAL_COMMUNITY): Payer: Self-pay | Admitting: Nurse Practitioner

## 2017-09-21 ENCOUNTER — Other Ambulatory Visit: Payer: Self-pay

## 2017-09-21 ENCOUNTER — Ambulatory Visit (HOSPITAL_COMMUNITY)
Admit: 2017-09-21 | Discharge: 2017-09-21 | Disposition: A | Discharge status: Home / Self Care | Payer: BLUE CROSS/BLUE SHIELD | Source: Ambulatory Visit | Attending: Nurse Practitioner | Admitting: Nurse Practitioner

## 2017-09-21 VITALS — BP 112/66 | HR 115 | Ht 75.0 in | Wt 338.0 lb

## 2017-09-21 DIAGNOSIS — Z7984 Long term (current) use of oral hypoglycemic drugs: Secondary | ICD-10-CM | POA: Diagnosis not present

## 2017-09-21 DIAGNOSIS — Z8673 Personal history of transient ischemic attack (TIA), and cerebral infarction without residual deficits: Secondary | ICD-10-CM | POA: Diagnosis not present

## 2017-09-21 DIAGNOSIS — Z9884 Bariatric surgery status: Secondary | ICD-10-CM | POA: Diagnosis not present

## 2017-09-21 DIAGNOSIS — Z823 Family history of stroke: Secondary | ICD-10-CM | POA: Insufficient documentation

## 2017-09-21 DIAGNOSIS — E119 Type 2 diabetes mellitus without complications: Secondary | ICD-10-CM | POA: Diagnosis not present

## 2017-09-21 DIAGNOSIS — M1990 Unspecified osteoarthritis, unspecified site: Secondary | ICD-10-CM | POA: Diagnosis not present

## 2017-09-21 DIAGNOSIS — Z7901 Long term (current) use of anticoagulants: Secondary | ICD-10-CM | POA: Insufficient documentation

## 2017-09-21 DIAGNOSIS — Z6841 Body Mass Index (BMI) 40.0 and over, adult: Secondary | ICD-10-CM | POA: Diagnosis not present

## 2017-09-21 DIAGNOSIS — I10 Essential (primary) hypertension: Secondary | ICD-10-CM | POA: Insufficient documentation

## 2017-09-21 DIAGNOSIS — G4730 Sleep apnea, unspecified: Secondary | ICD-10-CM | POA: Diagnosis not present

## 2017-09-21 DIAGNOSIS — E785 Hyperlipidemia, unspecified: Secondary | ICD-10-CM | POA: Diagnosis not present

## 2017-09-21 DIAGNOSIS — Z841 Family history of disorders of kidney and ureter: Secondary | ICD-10-CM | POA: Diagnosis not present

## 2017-09-21 DIAGNOSIS — I4892 Unspecified atrial flutter: Secondary | ICD-10-CM | POA: Diagnosis present

## 2017-09-21 DIAGNOSIS — Z8546 Personal history of malignant neoplasm of prostate: Secondary | ICD-10-CM | POA: Insufficient documentation

## 2017-09-21 DIAGNOSIS — I483 Typical atrial flutter: Secondary | ICD-10-CM | POA: Insufficient documentation

## 2017-09-21 DIAGNOSIS — Z79899 Other long term (current) drug therapy: Secondary | ICD-10-CM | POA: Insufficient documentation

## 2017-09-21 NOTE — Progress Notes (Addendum)
Primary Care Physician: Tempie Hoist, MD Referring Physician: Rosaria Ferries, PA Cardiologist: Dr. Nelda Bucks Garrett Welch is a 67 y.o. male with a h/o medically managed CAD, anomalous LCx, diabetes, hypertension, hyperlipidemia, morbid obesity s/p gastric bypass, and OSA on CPAP referred to the ED from clinic when he was noted to be in atrial flutter with RVR.  He went for ETT to evaluate for ischemia.  He has been asymptomatic but was to undergo ETT given his known CAD and anomalous coronary.  He was asymptomatic with arrythmia. He was started on metoprolol and Eliquis  5mg  bid.He was asked to use CPAP, limit caffeine as he was drinkiung a pot of coffee a day and continue to work on weight loss.He was set up for echo and will have stress test when heart rate is better controlled.  Today in the afib clinic, he is still asymptomatic. He feels well. He has gone on a low carb diet and has lost 18 lbs since 5/15. His Ekg appears to be typical atrial flutterwith v rates in the low 110's. At several mins of rest, HR in upper 90's to 100 by pulse ox   Denies symptoms of palpitations, chest pain, shortness of breath, orthopnea, PND, lower extremity edema, dizziness, presyncope, syncope, or neurologic sequela. The patient is tolerating medications without difficulties and is otherwise without complaint today.   Past Medical History:  Diagnosis Date  . Atrial flutter with rapid ventricular response ([place]) 09/16/2017  . Cancer ([place])   . Diabetes mellitus without complication ([place])   . HTN (hypertension)   . Hyperlipidemia LDL goal <70   . OA (osteoarthritis)   . PAC (premature atrial contraction)   . Prostate cancer ([place])   . Sleep apnea    Past Surgical History:  Procedure Laterality Date  . CARDIAC CATHETERIZATION  09/2014   [facility] in [place], LAD 100%, anomalous CFX 40-50%, RCA patent  . GASTRIC BYPASS      Current Outpatient Medications  Medication Sig Dispense Refill  .  acetaminophen (TYLENOL) 500 MG tablet Take 1,000 mg by mouth every 8 (eight) hours as needed (pain).    Marland Kitchen albuterol (PROVENTIL HFA;VENTOLIN HFA) 108 (90 Base) MCG/ACT inhaler Inhale 2 puffs into the lungs every 6 (six) hours as needed for wheezing or shortness of breath.    Marland Kitchen apixaban (ELIQUIS) 5 MG TABS tablet Take 1 tablet (5 mg total) by mouth 2 (two) times daily. 60 tablet 11  . ferrous sulfate 325 (65 FE) MG EC tablet Take 325 mg by mouth 2 (two) times daily.     . fluticasone (FLONASE) 50 MCG/ACT nasal spray Place 1 spray into both nostrils daily as needed for allergies.     . hydrochlorothiazide (HYDRODIURIL) 25 MG tablet TAKE 1 TABLET (25 MG TOTAL) BY MOUTH DAILY. PLEASE KEEP UPCOMING APPT FOR FUTURE REFILLS. THANKS 90 tablet 2  . irbesartan (AVAPRO) 75 MG tablet Take 1 tablet (75 mg total) by mouth daily. 30 tablet 6  . isosorbide mononitrate (IMDUR) 30 MG 24 hr tablet TAKE ONE TABLET (30 MG TOTAL) BY MOUTH DAILY. 30 tablet 10  . metFORMIN (GLUCOPHAGE) 500 MG tablet Take 1,000 mg by mouth daily with breakfast. Take 500mg  by mouth in the evening.  9  . metoprolol tartrate (LOPRESSOR) 50 MG tablet Take 1 tablet (50 mg total) by mouth 2 (two) times daily. 60 tablet 6  . Multiple Vitamins-Minerals (MULTIVITAMIN WITH MINERALS) tablet Take 1 tablet by mouth daily.    . naproxen  sodium (ANAPROX) 220 MG tablet Take 220 mg by mouth daily as needed (pain).    Marland Kitchen omega-3 fish oil (MAXEPA) 1000 MG CAPS capsule Take 2 g by mouth daily.    . pramipexole (MIRAPEX) 0.125 MG tablet Take 0.125 mg by mouth at bedtime.  2  . rosuvastatin (CRESTOR) 10 MG tablet Take 1 tablet (10 mg total) by mouth daily. 90 tablet 3  . sodium fluoride (PREVIDENT 5000 PLUS) 1.1 % CREA dental cream Take 1 application by mouth at bedtime.    . tamsulosin (FLOMAX) 0.4 MG CAPS capsule Take 0.4 mg by mouth daily.  3  . TURMERIC PO Take 1 tablet by mouth daily.     No current facility-administered medications for this encounter.      Allergies  Allergen Reactions  . Tape Rash    Social History   Socioeconomic History  . Marital status: Married    Spouse name: Not on file  . Number of children: Not on file  . Years of education: Not on file  . Highest education level: Not on file  Occupational History  . Occupation: Theme park manager  Social Needs  . Financial resource strain: Not on file  . Food insecurity:    Worry: Not on file    Inability: Not on file  . Transportation needs:    Medical: Not on file    Non-medical: Not on file  Tobacco Use  . Smoking status: Never Smoker  . Smokeless tobacco: Never Used  Substance and Sexual Activity  . Alcohol use: Yes    Comment: occ  . Drug use: Not on file  . Sexual activity: Not on file  Lifestyle  . Physical activity:    Days per week: Not on file    Minutes per session: Not on file  . Stress: Not on file  Relationships  . Social connections:    Talks on phone: Not on file    Gets together: Not on file    Attends religious service: Not on file    Active member of club or organization: Not on file    Attends meetings of clubs or organizations: Not on file    Relationship status: Not on file  . Intimate partner violence:    Fear of current or ex partner: Not on file    Emotionally abused: Not on file    Physically abused: Not on file    Forced sexual activity: Not on file  Other Topics Concern  . 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     Family History  Problem Relation Age of Onset  . Heart failure Mother   . Heart disease Mother   . Heart attack Mother   . Hypertension Mother   . Stroke Father   . Kidney failure Father   . Cancer Father   . Hypertension Maternal Grandmother     ROS- All systems are reviewed and negative except as per the HPI above  Physical Exam: Vitals:   09/21/17 0932  BP: 112/66  Pulse: (!) 115  Weight: (!) 338 lb (153.3 kg)  Height: 6\' 3"  (1.905 m)   Wt Readings  from Last 3 Encounters:  09/21/17 (!) 338 lb (153.3 kg)  09/16/17 (!) 356 lb (161.5 kg)  04/11/17 (!) 367 lb 12.8 oz (166.8 kg)    Labs: Lab Results  Component Value Date   NA 139 09/16/2017   K 4.0 09/16/2017   CL 103 09/16/2017  CO2 27 01/10/2015   GLUCOSE 124 (H) 09/16/2017   BUN 19 09/16/2017   CREATININE 0.90 09/16/2017   CALCIUM 9.1 01/10/2015   No results found for: INR Lab Results  Component Value Date   CHOL 141 06/05/2015   HDL 47 06/05/2015   LDLCALC 81 06/05/2015   TRIG 65 06/05/2015     GEN- The patient is well appearing, alert and oriented x 3 today.   Head- normocephalic, atraumatic Eyes-  Sclera clear, conjunctiva pink Ears- hearing intact Oropharynx- clear Neck- supple, no JVP Lymph- no cervical lymphadenopathy Lungs- Clear to ausculation bilaterally, normal work of breathing Heart- Regular fast  rate and rhythm, no murmurs, rubs or gallops, PMI not laterally displaced GI- soft, NT, ND, + BS Extremities- no clubbing, cyanosis, or edema MS- no significant deformity or atrophy Skin- no rash or lesion Psych- euthymic mood, full affect Neuro- strength and sensation are intact  EKG-typical atrial flutter at 115 bpm Echo pending 6/5 Epic records reviewed    Assessment and Plan: 1. New onset typical atrial flutter General education re aflutter Continue Eliquis 5 mg bid for chadsvasc score of 3 Continue metoprolol 50 mg bid Continue limiting caffeine Continue weight loss efforts  Rescheduled stress test when arrhythmia fully addressed  Will refer to Dr. Lovena Le for Typical flutter ablation  Garrett Welch, [facility] [address] [place], [place] [address] [phone]

## 2017-09-28 ENCOUNTER — Ambulatory Visit (HOSPITAL_COMMUNITY)
Admit: 2017-09-28 | Discharge: 2017-09-28 | Disposition: A | Discharge status: Home / Self Care | Payer: BLUE CROSS/BLUE SHIELD | Attending: Physician Assistant | Admitting: Physician Assistant

## 2017-09-28 DIAGNOSIS — I10 Essential (primary) hypertension: Secondary | ICD-10-CM | POA: Insufficient documentation

## 2017-09-28 DIAGNOSIS — I4892 Unspecified atrial flutter: Secondary | ICD-10-CM | POA: Diagnosis present

## 2017-09-28 DIAGNOSIS — E785 Hyperlipidemia, unspecified: Secondary | ICD-10-CM | POA: Insufficient documentation

## 2017-09-28 DIAGNOSIS — I071 Rheumatic tricuspid insufficiency: Secondary | ICD-10-CM | POA: Insufficient documentation

## 2017-09-28 DIAGNOSIS — I371 Nonrheumatic pulmonary valve insufficiency: Secondary | ICD-10-CM | POA: Insufficient documentation

## 2017-09-28 DIAGNOSIS — I483 Typical atrial flutter: Secondary | ICD-10-CM | POA: Diagnosis present

## 2017-09-28 DIAGNOSIS — E119 Type 2 diabetes mellitus without complications: Secondary | ICD-10-CM | POA: Diagnosis not present

## 2017-09-28 DIAGNOSIS — I4891 Unspecified atrial fibrillation: Secondary | ICD-10-CM | POA: Insufficient documentation

## 2017-09-28 MED ORDER — PERFLUTREN LIPID MICROSPHERE
1.0000 mL | INTRAVENOUS | Status: AC | PRN
  Administered 2017-09-28: 2 mL via INTRAVENOUS
  Filled 2017-09-28: qty 10

## 2017-09-28 NOTE — Progress Notes (Signed)
  Echocardiogram 2D Echocardiogram has been performed.  Garrett Welch 09/28/2017, 9:14 AM

## 2017-09-29 NOTE — Telephone Encounter (Signed)
Left message for patient to call back to confirm Dr. Antionette Char instructions.

## 2017-10-04 ENCOUNTER — Encounter: Payer: Self-pay | Admitting: Cardiovascular Disease

## 2017-10-05 ENCOUNTER — Encounter: Payer: Self-pay | Admitting: Internal Medicine

## 2017-10-05 ENCOUNTER — Ambulatory Visit (INDEPENDENT_AMBULATORY_CARE_PROVIDER_SITE_OTHER): Payer: BLUE CROSS/BLUE SHIELD | Admitting: Internal Medicine

## 2017-10-05 VITALS — BP 110/80 | HR 109 | Ht 75.0 in | Wt 341.0 lb

## 2017-10-05 DIAGNOSIS — I483 Typical atrial flutter: Secondary | ICD-10-CM

## 2017-10-05 NOTE — H&P (View-Only) (Signed)
HPI [name] is referred by Roderic Palau for evaluation of atrial flutter. He is a pleasant 67 yo man with a h/o HTN, obesity and CAD with an occluded LAD with collaterals. He does not have angina. He was found to be in atrial flutter 3 weeks ago. He was started on Eliquis. He has not had syncope and denies chest pain. He has undergone a 2D echo with an EF of 30%. He has global HK. The patient denies chest pain or sob. No syncope.  Allergies  Allergen Reactions  . Tape Rash     Current Outpatient Medications  Medication Sig Dispense Refill  . acetaminophen (TYLENOL) 500 MG tablet Take 1,000 mg by mouth every 8 (eight) hours as needed (pain).    Marland Kitchen albuterol (PROVENTIL HFA;VENTOLIN HFA) 108 (90 Base) MCG/ACT inhaler Inhale 2 puffs into the lungs every 6 (six) hours as needed for wheezing or shortness of breath.    Marland Kitchen apixaban (ELIQUIS) 5 MG TABS tablet Take 1 tablet (5 mg total) by mouth 2 (two) times daily. 60 tablet 11  . ferrous sulfate 325 (65 FE) MG EC tablet Take 325 mg by mouth 2 (two) times daily.     . fluticasone (FLONASE) 50 MCG/ACT nasal spray Place 1 spray into both nostrils daily as needed for allergies.     . hydrochlorothiazide (HYDRODIURIL) 25 MG tablet TAKE 1 TABLET (25 MG TOTAL) BY MOUTH DAILY. PLEASE KEEP UPCOMING APPT FOR FUTURE REFILLS. THANKS 90 tablet 2  . isosorbide mononitrate (IMDUR) 30 MG 24 hr tablet TAKE ONE TABLET (30 MG TOTAL) BY MOUTH DAILY. 30 tablet 10  . metFORMIN (GLUCOPHAGE) 500 MG tablet Take 1,000 mg by mouth daily with breakfast. Take 500mg  by mouth in the evening.  9  . metoprolol tartrate (LOPRESSOR) 50 MG tablet Take 1 tablet (50 mg total) by mouth 2 (two) times daily. 60 tablet 6  . Multiple Vitamins-Minerals (MULTIVITAMIN WITH MINERALS) tablet Take 1 tablet by mouth daily.    . naproxen sodium (ANAPROX) 220 MG tablet Take 220 mg by mouth daily as needed (pain).    Marland Kitchen omega-3 fish oil (MAXEPA) 1000 MG CAPS capsule Take 2 g by mouth daily.     . pramipexole (MIRAPEX) 0.125 MG tablet Take 0.125 mg by mouth at bedtime.  2  . rosuvastatin (CRESTOR) 10 MG tablet Take 1 tablet (10 mg total) by mouth daily. 90 tablet 3  . sodium fluoride (PREVIDENT 5000 PLUS) 1.1 % CREA dental cream Take 1 application by mouth at bedtime.    . tamsulosin (FLOMAX) 0.4 MG CAPS capsule Take 0.4 mg by mouth daily.  3   No current facility-administered medications for this visit.      Past Medical History:  Diagnosis Date  . Atrial flutter with rapid ventricular response ([place]) 09/16/2017  . Cancer ([place])   . Diabetes mellitus without complication ([place])   . HTN (hypertension)   . Hyperlipidemia LDL goal <70   . OA (osteoarthritis)   . PAC (premature atrial contraction)   . Prostate cancer ([place])   . Sleep apnea     ROS:   All systems reviewed and negative except as noted in the HPI.   Past Surgical History:  Procedure Laterality Date  . 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 GASTRIC BYPASS       Family History  Problem Relation Age of Onset  . Heart failure Mother   .  Heart disease Mother   . Heart attack Mother   . Hypertension Mother   . Stroke Father   . Kidney failure Father   . Cancer Father   . Hypertension Maternal Grandmother      Social History   Socioeconomic History  . Marital status: Married    Spouse name: Not on file  . Number of children: Not on file  . Years of education: Not on file  . Highest education level: Not on file  Occupational History  . Occupation: Theme park manager  Social Needs  . Financial resource strain: Not on file  . Food insecurity:    Worry: Not on file    Inability: Not on file  . Transportation needs:    Medical: Not on file    Non-medical: Not on file  Tobacco Use  . Smoking status: Never Smoker  . Smokeless tobacco: Never Used  Substance and Sexual Activity  . Alcohol use: Yes    Comment: occ  . Drug use: Not on file  . Sexual  activity: Not on file  Lifestyle  . Physical activity:    Days per week: Not on file    Minutes per session: Not on file  . Stress: Not on file  Relationships  . Social connections:    Talks on phone: Not on file    Gets together: Not on file    Attends religious service: Not on file    Active member of club or organization: Not on file    Attends meetings of clubs or organizations: Not on file    Relationship status: Not on file  . Intimate partner violence:    Fear of current or ex partner: Not on file    Emotionally abused: Not on file    Physically abused: Not on file    Forced sexual activity: Not on file  Other Topics Concern  . 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      BP 110/80   Pulse (!) 109   Ht 6\' 3"  (1.905 m)   Wt (!) 341 lb (154.7 kg)   BMI 42.62 kg/m   Physical Exam:  Well appearing NAD HEENT: Unremarkable Neck:  No JVD, no thyromegally Lymphatics:  No adenopathy Back:  No CVA tenderness Lungs:  Clear HEART:  IRegular tachy rhythm, no murmurs, no rubs, no clicks Abd:  soft, positive bowel sounds, no organomegally, no rebound, no guarding Ext:  2 plus pulses, no edema, no cyanosis, no clubbing Skin:  No rashes no nodules Neuro:  CN II through XII intact, motor grossly intact  EKG - atrial flutter with a RVR  Assess/Plan: 1. Atrial flutter - I discussed the indications/risks/benefits/goals/expectations of EP study and catheter ablation. He would like to proceed. I have asked him not to miss any of his systemic anti-coagulation. His rate is reasonably well controlled. 2. Obesity - he has lost weight. He is encouraged to continue 3. LV dysfunction - his EF is down. He will almost certainly have improvement of his EF after ablation.  4. coags -he has had no bleeding on eliquis. I would anticipate stopping the eliquis a few weeks after ablation.  Mikle Bosworth.D.

## 2017-10-05 NOTE — Progress Notes (Addendum)
HPI [name] is referred by Roderic Palau for evaluation of atrial flutter. He is a pleasant 67 yo man with a h/o HTN, obesity and CAD with an occluded LAD with collaterals. He does not have angina. He was found to be in atrial flutter 3 weeks ago. He was started on Eliquis. He has not had syncope and denies chest pain. He has undergone a 2D echo with an EF of 30%. He has global HK. The patient denies chest pain or sob. No syncope.  Allergies  Allergen Reactions  . Tape Rash     Current Outpatient Medications  Medication Sig Dispense Refill  . acetaminophen (TYLENOL) 500 MG tablet Take 1,000 mg by mouth every 8 (eight) hours as needed (pain).    Marland Kitchen albuterol (PROVENTIL HFA;VENTOLIN HFA) 108 (90 Base) MCG/ACT inhaler Inhale 2 puffs into the lungs every 6 (six) hours as needed for wheezing or shortness of breath.    Marland Kitchen apixaban (ELIQUIS) 5 MG TABS tablet Take 1 tablet (5 mg total) by mouth 2 (two) times daily. 60 tablet 11  . ferrous sulfate 325 (65 FE) MG EC tablet Take 325 mg by mouth 2 (two) times daily.     . fluticasone (FLONASE) 50 MCG/ACT nasal spray Place 1 spray into both nostrils daily as needed for allergies.     . hydrochlorothiazide (HYDRODIURIL) 25 MG tablet TAKE 1 TABLET (25 MG TOTAL) BY MOUTH DAILY. PLEASE KEEP UPCOMING APPT FOR FUTURE REFILLS. THANKS 90 tablet 2  . isosorbide mononitrate (IMDUR) 30 MG 24 hr tablet TAKE ONE TABLET (30 MG TOTAL) BY MOUTH DAILY. 30 tablet 10  . metFORMIN (GLUCOPHAGE) 500 MG tablet Take 1,000 mg by mouth daily with breakfast. Take 500mg  by mouth in the evening.  9  . metoprolol tartrate (LOPRESSOR) 50 MG tablet Take 1 tablet (50 mg total) by mouth 2 (two) times daily. 60 tablet 6  . Multiple Vitamins-Minerals (MULTIVITAMIN WITH MINERALS) tablet Take 1 tablet by mouth daily.    . naproxen sodium (ANAPROX) 220 MG tablet Take 220 mg by mouth daily as needed (pain).    Marland Kitchen omega-3 fish oil (MAXEPA) 1000 MG CAPS capsule Take 2 g by mouth daily.     . pramipexole (MIRAPEX) 0.125 MG tablet Take 0.125 mg by mouth at bedtime.  2  . rosuvastatin (CRESTOR) 10 MG tablet Take 1 tablet (10 mg total) by mouth daily. 90 tablet 3  . sodium fluoride (PREVIDENT 5000 PLUS) 1.1 % CREA dental cream Take 1 application by mouth at bedtime.    . tamsulosin (FLOMAX) 0.4 MG CAPS capsule Take 0.4 mg by mouth daily.  3   No current facility-administered medications for this visit.      Past Medical History:  Diagnosis Date  . Atrial flutter with rapid ventricular response ([place]) 09/16/2017  . Cancer ([place])   . Diabetes mellitus without complication ([place])   . HTN (hypertension)   . Hyperlipidemia LDL goal <70   . OA (osteoarthritis)   . PAC (premature atrial contraction)   . Prostate cancer ([place])   . Sleep apnea     ROS:   All systems reviewed and negative except as noted in the HPI.   Past Surgical History:  Procedure Laterality Date  . 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 GASTRIC BYPASS       Family History  Problem Relation Age of Onset  . Heart failure Mother   .  Heart disease Mother   . Heart attack Mother   . Hypertension Mother   . Stroke Father   . Kidney failure Father   . Cancer Father   . Hypertension Maternal Grandmother      Social History   Socioeconomic History  . Marital status: Married    Spouse name: Not on file  . Number of children: Not on file  . Years of education: Not on file  . Highest education level: Not on file  Occupational History  . Occupation: Theme park manager  Social Needs  . Financial resource strain: Not on file  . Food insecurity:    Worry: Not on file    Inability: Not on file  . Transportation needs:    Medical: Not on file    Non-medical: Not on file  Tobacco Use  . Smoking status: Never Smoker  . Smokeless tobacco: Never Used  Substance and Sexual Activity  . Alcohol use: Yes    Comment: occ  . Drug use: Not on file  . Sexual  activity: Not on file  Lifestyle  . Physical activity:    Days per week: Not on file    Minutes per session: Not on file  . Stress: Not on file  Relationships  . Social connections:    Talks on phone: Not on file    Gets together: Not on file    Attends religious service: Not on file    Active member of club or organization: Not on file    Attends meetings of clubs or organizations: Not on file    Relationship status: Not on file  . Intimate partner violence:    Fear of current or ex partner: Not on file    Emotionally abused: Not on file    Physically abused: Not on file    Forced sexual activity: Not on file  Other Topics Concern  . 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      BP 110/80   Pulse (!) 109   Ht 6\' 3"  (1.905 m)   Wt (!) 341 lb (154.7 kg)   BMI 42.62 kg/m   Physical Exam:  Well appearing NAD HEENT: Unremarkable Neck:  No JVD, no thyromegally Lymphatics:  No adenopathy Back:  No CVA tenderness Lungs:  Clear HEART:  IRegular tachy rhythm, no murmurs, no rubs, no clicks Abd:  soft, positive bowel sounds, no organomegally, no rebound, no guarding Ext:  2 plus pulses, no edema, no cyanosis, no clubbing Skin:  No rashes no nodules Neuro:  CN II through XII intact, motor grossly intact  EKG - atrial flutter with a RVR  Assess/Plan: 1. Atrial flutter - I discussed the indications/risks/benefits/goals/expectations of EP study and catheter ablation. He would like to proceed. I have asked him not to miss any of his systemic anti-coagulation. His rate is reasonably well controlled. 2. Obesity - he has lost weight. He is encouraged to continue 3. LV dysfunction - his EF is down. He will almost certainly have improvement of his EF after ablation.  4. coags -he has had no bleeding on eliquis. I would anticipate stopping the eliquis a few weeks after ablation.  Mikle Bosworth.D.

## 2017-10-05 NOTE — Patient Instructions (Addendum)
Medication Instructions:  Your physician recommends that you continue on your current medications as directed. Please refer to the Current Medication list given to you today.  Labwork: You will get lab work today:  BMP and CBC  Testing/Procedures: Your physician has recommended that you have an ablation. Catheter ablation is a medical procedure used to treat some cardiac arrhythmias (irregular heartbeats). During catheter ablation, a long, thin, flexible tube is put into a blood vessel in your groin (upper thigh), or neck. This tube is called an ablation catheter. It is then guided to your heart through the blood vessel. Radio frequency waves destroy small areas of heart tissue where abnormal heartbeats may cause an arrhythmia to start. Please see the instruction sheet given to you today.  Follow-Up: You will follow up with Dr. Lovena Le 4 weeks after your procedure.  Any Other Special Instructions Will Be Listed Below (If Applicable).  ABLATION INSTRUCTION:  Please arrive at the [facility] main entrance of [facility] hospital at:  7:30 am on October 18, 2017 Do not eat or drink after midnight prior to procedure You may take all your normal morning medications on the day of your procedure except for:  HCTZ, Metformin and Eliquis.  Use a sip of water. DO NOT MISS ANY DOSES OF YOUR ELIQUIS PRIOR TO YOUR PROCEDURE Plan for one night stay but you may be discharged You will need someone to drive you home at discharge   If you need a refill on your cardiac medications before your next appointment, please call your pharmacy.    Cardiac Ablation Cardiac ablation is a procedure to disable (ablate) a small amount of heart tissue in very specific places. The heart has many electrical connections. Sometimes these connections are abnormal and can cause the heart to beat very fast or irregularly. Ablating some of the problem areas can improve the heart rhythm or return it to normal. Ablation may be done for  people who:  Have Wolff-Parkinson-White syndrome.  Have fast heart rhythms (tachycardia).  Have taken medicines for an abnormal heart rhythm (arrhythmia) that were not effective or caused side effects.  Have a high-risk heartbeat that may be life-threatening.  During the procedure, a small incision is made in the neck or the groin, and a long, thin, flexible tube (catheter) is inserted into the incision and moved to the heart. Small devices (electrodes) on the tip of the catheter will send out electrical currents. A type of X-ray (fluoroscopy) will be used to help guide the catheter and to provide images of the heart. Tell a health care provider about:  Any allergies you have.  All medicines you are taking, including vitamins, herbs, eye drops, creams, and over-the-counter medicines.  Any problems you or family members have had with anesthetic medicines.  Any blood disorders you have.  Any surgeries you have had.  Any medical conditions you have, such as kidney failure.  Whether you are pregnant or may be pregnant. What are the risks? Generally, this is a safe procedure. However, problems may occur, including:  Infection.  Bruising and bleeding at the catheter insertion site.  Bleeding into the chest, especially into the sac that surrounds the heart. This is a serious complication.  Stroke or blood clots.  Damage to other structures or organs.  Allergic reaction to medicines or dyes.  Need for a permanent pacemaker if the normal electrical system is damaged. A pacemaker is a small computer that sends electrical signals to the heart and helps your heart  beat normally.  The procedure not being fully effective. This may not be recognized until months later. Repeat ablation procedures are sometimes required.  What happens before the procedure?  Follow instructions from your health care provider about eating or drinking restrictions.  Ask your health care provider  about: ? Changing or stopping your regular medicines. This is especially important if you are taking diabetes medicines or blood thinners. ? Taking medicines such as aspirin and ibuprofen. These medicines can thin your blood. Do not take these medicines before your procedure if your health care provider instructs you not to.  Plan to have someone take you home from the hospital or clinic.  If you will be going home right after the procedure, plan to have someone with you for 24 hours. What happens during the procedure?  To lower your risk of infection: ? Your health care team will wash or sanitize their hands. ? Your skin will be washed with soap. ? Hair may be removed from the incision area.  An IV tube will be inserted into one of your veins.  You will be given a medicine to help you relax (sedative).  The skin on your neck or groin will be numbed.  An incision will be made in your neck or your groin.  A needle will be inserted through the incision and into a large vein in your neck or groin.  A catheter will be inserted into the needle and moved to your heart.  Dye may be injected through the catheter to help your surgeon see the area of the heart that needs treatment.  Electrical currents will be sent from the catheter to ablate heart tissue in desired areas. There are three types of energy that may be used to ablate heart tissue: ? Heat (radiofrequency energy). ? Laser energy. ? Extreme cold (cryoablation).  When the necessary tissue has been ablated, the catheter will be removed.  Pressure will be held on the catheter insertion area to prevent excessive bleeding.  A bandage (dressing) will be placed over the catheter insertion area. The procedure may vary among health care providers and hospitals. What happens after the procedure?  Your blood pressure, heart rate, breathing rate, and blood oxygen level will be monitored until the medicines you were given have worn  off.  Your catheter insertion area will be monitored for bleeding. You will need to lie still for a few hours to ensure that you do not bleed from the catheter insertion area.  Do not drive for 24 hours or as long as directed by your health care provider. Summary  Cardiac ablation is a procedure to disable (ablate) a small amount of heart tissue in very specific places. Ablating some of the problem areas can improve the heart rhythm or return it to normal.  During the procedure, electrical currents will be sent from the catheter to ablate heart tissue in desired areas. This information is not intended to replace advice given to you by your health care provider. Make sure you discuss any questions you have with your health care provider. Document Released: 08/29/2008 Document Revised: 03/01/2016 Document Reviewed: 03/01/2016 Elsevier Interactive Patient Education  [employer].

## 2017-10-06 LAB — CBC WITH DIFFERENTIAL/PLATELET
BASOS ABS: 0 10*3/uL (ref 0.0–0.2)
Basos: 0 %
EOS (ABSOLUTE): 0.5 10*3/uL — ABNORMAL HIGH (ref 0.0–0.4)
Eos: 5 %
HEMOGLOBIN: 14.3 g/dL (ref 13.0–17.7)
Hematocrit: 43.2 % (ref 37.5–51.0)
IMMATURE GRANS (ABS): 0 10*3/uL (ref 0.0–0.1)
Immature Granulocytes: 0 %
LYMPHS ABS: 2.5 10*3/uL (ref 0.7–3.1)
Lymphs: 26 %
MCH: 30.6 pg (ref 26.6–33.0)
MCHC: 33.1 g/dL (ref 31.5–35.7)
MCV: 93 fL (ref 79–97)
MONOCYTES: 8 %
Monocytes Absolute: 0.7 10*3/uL (ref 0.1–0.9)
NEUTROS ABS: 6 10*3/uL (ref 1.4–7.0)
Neutrophils: 61 %
Platelets: 278 10*3/uL (ref 150–450)
RBC: 4.67 x10E6/uL (ref 4.14–5.80)
RDW: 13 % (ref 12.3–15.4)
WBC: 9.8 10*3/uL (ref 3.4–10.8)

## 2017-10-06 LAB — BASIC METABOLIC PANEL
BUN / CREAT RATIO: 22 (ref 10–24)
BUN: 21 mg/dL (ref 8–27)
CO2: 23 mmol/L (ref 20–29)
CREATININE: 0.96 mg/dL (ref 0.76–1.27)
Calcium: 9.6 mg/dL (ref 8.6–10.2)
Chloride: 104 mmol/L (ref 96–106)
GFR calc Af Amer: 95 mL/min/{1.73_m2} (ref >59)
GFR calc non Af Amer: 83 mL/min/{1.73_m2} (ref >59)
GLUCOSE: 143 mg/dL — AB (ref 65–99)
Potassium: 4.2 mmol/L (ref 3.5–5.2)
SODIUM: 140 mmol/L (ref 134–144)

## 2017-10-18 ENCOUNTER — Encounter (HOSPITAL_COMMUNITY)
Admission: RE | Disposition: A | Discharge status: Home / Self Care | Payer: Self-pay | Source: Ambulatory Visit | Attending: Internal Medicine

## 2017-10-18 ENCOUNTER — Ambulatory Visit (HOSPITAL_COMMUNITY)
Admission: RE | Admit: 2017-10-18 | Discharge: 2017-10-18 | Disposition: A | Discharge status: Home / Self Care | Payer: BLUE CROSS/BLUE SHIELD | Source: Ambulatory Visit | Attending: Internal Medicine | Admitting: Internal Medicine

## 2017-10-18 DIAGNOSIS — I10 Essential (primary) hypertension: Secondary | ICD-10-CM | POA: Insufficient documentation

## 2017-10-18 DIAGNOSIS — M1990 Unspecified osteoarthritis, unspecified site: Secondary | ICD-10-CM | POA: Insufficient documentation

## 2017-10-18 DIAGNOSIS — Z6841 Body Mass Index (BMI) 40.0 and over, adult: Secondary | ICD-10-CM | POA: Diagnosis not present

## 2017-10-18 DIAGNOSIS — I2510 Atherosclerotic heart disease of native coronary artery without angina pectoris: Secondary | ICD-10-CM | POA: Insufficient documentation

## 2017-10-18 DIAGNOSIS — I4892 Unspecified atrial flutter: Secondary | ICD-10-CM | POA: Diagnosis present

## 2017-10-18 DIAGNOSIS — I491 Atrial premature depolarization: Secondary | ICD-10-CM | POA: Diagnosis not present

## 2017-10-18 DIAGNOSIS — E119 Type 2 diabetes mellitus without complications: Secondary | ICD-10-CM | POA: Diagnosis not present

## 2017-10-18 DIAGNOSIS — G4730 Sleep apnea, unspecified: Secondary | ICD-10-CM | POA: Diagnosis not present

## 2017-10-18 DIAGNOSIS — I483 Typical atrial flutter: Secondary | ICD-10-CM | POA: Diagnosis not present

## 2017-10-18 DIAGNOSIS — E669 Obesity, unspecified: Secondary | ICD-10-CM | POA: Diagnosis not present

## 2017-10-18 DIAGNOSIS — Z9884 Bariatric surgery status: Secondary | ICD-10-CM | POA: Insufficient documentation

## 2017-10-18 DIAGNOSIS — Z8249 Family history of ischemic heart disease and other diseases of the circulatory system: Secondary | ICD-10-CM | POA: Diagnosis not present

## 2017-10-18 DIAGNOSIS — Z7901 Long term (current) use of anticoagulants: Secondary | ICD-10-CM | POA: Insufficient documentation

## 2017-10-18 DIAGNOSIS — E785 Hyperlipidemia, unspecified: Secondary | ICD-10-CM | POA: Diagnosis not present

## 2017-10-18 DIAGNOSIS — Z7984 Long term (current) use of oral hypoglycemic drugs: Secondary | ICD-10-CM | POA: Insufficient documentation

## 2017-10-18 DIAGNOSIS — Z7951 Long term (current) use of inhaled steroids: Secondary | ICD-10-CM | POA: Insufficient documentation

## 2017-10-18 HISTORY — PX: A-FLUTTER ABLATION: EP1230

## 2017-10-18 LAB — GLUCOSE, CAPILLARY
GLUCOSE-CAPILLARY: 110 mg/dL — AB (ref 70–99)
Glucose-Capillary: 133 mg/dL — ABNORMAL HIGH (ref 70–99)

## 2017-10-18 SURGERY — A-FLUTTER ABLATION

## 2017-10-18 MED ORDER — MIDAZOLAM HCL 5 MG/5ML IJ SOLN
INTRAMUSCULAR | Status: AC
  Filled 2017-10-18: qty 5

## 2017-10-18 MED ORDER — MIDAZOLAM HCL 5 MG/5ML IJ SOLN
INTRAMUSCULAR | Status: DC | PRN
  Administered 2017-10-18 (×6): 1 mg via INTRAVENOUS
  Administered 2017-10-18: 2 mg via INTRAVENOUS
  Administered 2017-10-18 (×4): 1 mg via INTRAVENOUS

## 2017-10-18 MED ORDER — BUPIVACAINE HCL (PF) 0.25 % IJ SOLN
INTRAMUSCULAR | Status: DC | PRN
  Administered 2017-10-18: 45 mL

## 2017-10-18 MED ORDER — HEPARIN (PORCINE) IN NACL 2-0.9 UNITS/ML
INTRAMUSCULAR | Status: AC | PRN
  Administered 2017-10-18: 500 mL

## 2017-10-18 MED ORDER — SODIUM CHLORIDE 0.9% FLUSH: 3.0000 mL | Freq: Two times a day (BID) | INTRAVENOUS | Status: DC

## 2017-10-18 MED ORDER — FENTANYL CITRATE (PF) 100 MCG/2ML IJ SOLN
INTRAMUSCULAR | Status: AC
  Filled 2017-10-18: qty 2

## 2017-10-18 MED ORDER — ACETAMINOPHEN 325 MG PO TABS
650.0000 mg | ORAL_TABLET | ORAL | Status: DC | PRN
  Filled 2017-10-18: qty 2

## 2017-10-18 MED ORDER — SODIUM CHLORIDE 0.9% FLUSH: 3.0000 mL | INTRAVENOUS | Status: DC | PRN

## 2017-10-18 MED ORDER — SODIUM CHLORIDE 0.9 % IV SOLN
INTRAVENOUS | Status: DC
  Administered 2017-10-18 (×2): via INTRAVENOUS

## 2017-10-18 MED ORDER — ONDANSETRON HCL 4 MG/2ML IJ SOLN: 4.0000 mg | Freq: Four times a day (QID) | INTRAMUSCULAR | Status: DC | PRN

## 2017-10-18 MED ORDER — SODIUM CHLORIDE 0.9 % IV SOLN: 250.0000 mL | INTRAVENOUS | Status: DC | PRN

## 2017-10-18 MED ORDER — FENTANYL CITRATE (PF) 100 MCG/2ML IJ SOLN
INTRAMUSCULAR | Status: DC | PRN
  Administered 2017-10-18 (×9): 12.5 ug via INTRAVENOUS
  Administered 2017-10-18: 25 ug via INTRAVENOUS
  Administered 2017-10-18: 12.5 ug via INTRAVENOUS

## 2017-10-18 MED ORDER — HEPARIN (PORCINE) IN NACL 1000-0.9 UT/500ML-% IV SOLN
INTRAVENOUS | Status: AC
  Filled 2017-10-18: qty 500

## 2017-10-18 MED ORDER — BUPIVACAINE HCL (PF) 0.25 % IJ SOLN
INTRAMUSCULAR | Status: AC
  Filled 2017-10-18: qty 60

## 2017-10-18 SURGICAL SUPPLY — 11 items
BAG SNAP BAND KOVER 36X36 (MISCELLANEOUS) ×2 IMPLANT
CATH BLAZERPRIME XP (ABLATOR) ×2 IMPLANT
CATH JOSEPHSON QUAD-ALLRED 6FR (CATHETERS) ×2 IMPLANT
CATH POLARIS X 2.5/5/2.5 DECAP (CATHETERS) ×2 IMPLANT
PACK EP LATEX FREE (CUSTOM PROCEDURE TRAY) ×1
PACK EP LF (CUSTOM PROCEDURE TRAY) ×1 IMPLANT
PAD DEFIB LIFELINK (PAD) ×2 IMPLANT
SHEATH PINNACLE 6F 10CM (SHEATH) ×2 IMPLANT
SHEATH PINNACLE 7F 10CM (SHEATH) ×2 IMPLANT
SHEATH PINNACLE 8F 10CM (SHEATH) ×2 IMPLANT
SHIELD RADPAD SCOOP 12X17 (MISCELLANEOUS) ×2 IMPLANT

## 2017-10-18 NOTE — Interval H&P Note (Signed)
History and Physical Interval Note:  10/18/2017 9:47 AM  Garrett Welch  has presented today for surgery, with the diagnosis of atrial flutter  The various methods of treatment have been discussed with the patient and family. After consideration of risks, benefits and other options for treatment, the patient has consented to  Procedure(s): A-FLUTTER ABLATION (N/A) as a surgical intervention .  The patient's history has been reviewed, patient examined, no change in status, stable for surgery.  I have reviewed the patient's chart and labs.  Questions were answered to the patient's satisfaction.     [name]

## 2017-10-18 NOTE — Interval H&P Note (Signed)
History and Physical Interval Note:  10/18/2017 9:46 AM  Garrett Welch  has presented today for surgery, with the diagnosis of atrial flutter  The various methods of treatment have been discussed with the patient and family. After consideration of risks, benefits and other options for treatment, the patient has consented to  Procedure(s): A-FLUTTER ABLATION (N/A) as a surgical intervention .  The patient's history has been reviewed, patient examined, no change in status, stable for surgery.  I have reviewed the patient's chart and labs.  Questions were answered to the patient's satisfaction.     [name]

## 2017-10-18 NOTE — Discharge Instructions (Signed)
No driving for 4 days. No lifting over 5 lbs for 1 week. No sexual activity for 1 week. You may return to work in 1 week. Keep procedure site clean & dry. If you notice increased pain, swelling, bleeding or pus, call/return!  You may shower, but no soaking baths/hot tubs/pools for 1 week.   Cardiac Ablation, Care After This sheet gives you information about how to care for yourself after your procedure. Your health care provider may also give you more specific instructions. If you have problems or questions, contact your health care provider. What can I expect after the procedure? After the procedure, it is common to have:  Bruising around your puncture site.  Tenderness around your puncture site.  Skipped heartbeats.  Tiredness (fatigue).  Follow these instructions at home: Puncture site care  Follow instructions from your health care provider about how to take care of your puncture site. Make sure you: ? Wash your hands with soap and water before you change your bandage (dressing). If soap and water are not available, use hand sanitizer. ? Change your dressing as told by your health care provider. ? Leave stitches (sutures), skin glue, or adhesive strips in place. These skin closures may need to stay in place for up to 2 weeks. If adhesive strip edges start to loosen and curl up, you may trim the loose edges. Do not remove adhesive strips completely unless your health care provider tells you to do that.  Check your puncture site every day for signs of infection. Check for: ? Redness, swelling, or pain. ? Fluid or blood. If your puncture site starts to bleed, lie down on your back, apply firm pressure to the area, and contact your health care provider. ? Warmth. ? Pus or a bad smell. Driving  Ask your health care provider when it is safe for you to drive again after the procedure.  Do not drive or use heavy machinery while taking prescription pain medicine.  Do not drive for 24  hours if you were given a medicine to help you relax (sedative) during your procedure. Activity  Avoid activities that take a lot of effort for at least 3 days after your procedure.  Do not lift anything that is heavier than 10 lb (4.5 kg), or the limit that you are told, until your health care provider says that it is safe.  Return to your normal activities as told by your health care provider. Ask your health care provider what activities are safe for you. General instructions  Take over-the-counter and prescription medicines only as told by your health care provider.  Do not use any products that contain nicotine or tobacco, such as cigarettes and e-cigarettes. If you need help quitting, ask your health care provider.  Do not take baths, swim, or use a hot tub until your health care provider approves.  Do not drink alcohol for 24 hours after your procedure.  Keep all follow-up visits as told by your health care provider. This is important. Contact a health care provider if:  You have redness, mild swelling, or pain around your puncture site.  You have fluid or blood coming from your puncture site that stops after applying firm pressure to the area.  Your puncture site feels warm to the touch.  You have pus or a bad smell coming from your puncture site.  You have a fever.  You have chest pain or discomfort that spreads to your neck, jaw, or arm.  You are sweating  a lot.  You feel nauseous.  You have a fast or irregular heartbeat.  You have shortness of breath.  You are dizzy or light-headed and feel the need to lie down.  You have pain or numbness in the arm or leg closest to your puncture site. Get help right away if:  Your puncture site suddenly swells.  Your puncture site is bleeding and the bleeding does not stop after applying firm pressure to the area. These symptoms may represent a serious problem that is an emergency. Do not wait to see if the symptoms will  go away. Get medical help right away. Call your local emergency services (911 in the [place] Do not drive yourself to the hospital. Summary  After the procedure, it is normal to have bruising and tenderness at the puncture site in your groin, neck, or forearm.  Check your puncture site every day for signs of infection.  Get help right away if your puncture site is bleeding and the bleeding does not stop after applying firm pressure to the area. This is a medical emergency. This information is not intended to replace advice given to you by your health care provider. Make sure you discuss any questions you have with your health care provider. Document Released: 07/22/2016 Document Revised: 07/22/2016 Document Reviewed: 07/22/2016 Elsevier Interactive Patient Education  2018 [employer].

## 2017-10-18 NOTE — Progress Notes (Signed)
Site area: RFV x 3 Site Prior to Removal:  Level 0 Pressure Applied For: 15 minutes Manual:   yes Patient Status During Pull:  stable Post Pull Site:  Level 0 Post Pull Instructions Given: yes  Post Pull Pulses Present: palpable Dressing Applied:  tegaderm and gauze Bedrest begins @ 1130 Comments: pressure held by Dr Lovena Le

## 2017-10-19 ENCOUNTER — Encounter (HOSPITAL_COMMUNITY): Payer: Self-pay | Admitting: Internal Medicine

## 2017-10-19 NOTE — Addendum Note (Signed)
Encounter addended by: Sherran Needs, NP on: 10/19/2017 8:57 AM  Actions taken: LOS modified

## 2017-10-20 ENCOUNTER — Encounter: Payer: Self-pay | Admitting: Internal Medicine

## 2017-10-20 ENCOUNTER — Telehealth (HOSPITAL_COMMUNITY): Payer: Self-pay | Admitting: *Deleted

## 2017-10-20 MED ORDER — METOPROLOL TARTRATE 50 MG PO TABS: 25.0000 mg | ORAL_TABLET | Freq: Two times a day (BID) | ORAL | 6 refills | Status: DC

## 2017-10-20 NOTE — Telephone Encounter (Signed)
Pt called in with HR in the 40s consistently since aflutter ablation earlier this week. Pt is asymptomatic. Discussed with Roderic Palau NP will decrease metoprolol to 25mg  bid and follow HRs at home. Pt in agreement with call if further issues.

## 2017-11-23 ENCOUNTER — Encounter: Payer: Self-pay | Admitting: Internal Medicine

## 2017-11-23 ENCOUNTER — Other Ambulatory Visit: Payer: Self-pay | Admitting: Cardiovascular Disease

## 2017-11-23 ENCOUNTER — Ambulatory Visit (INDEPENDENT_AMBULATORY_CARE_PROVIDER_SITE_OTHER): Payer: BLUE CROSS/BLUE SHIELD | Admitting: Internal Medicine

## 2017-11-23 VITALS — BP 138/70 | HR 65 | Ht 75.0 in | Wt 331.4 lb

## 2017-11-23 DIAGNOSIS — I483 Typical atrial flutter: Secondary | ICD-10-CM

## 2017-11-23 NOTE — Patient Instructions (Addendum)
Medication Instructions:  Your physician recommends that you continue on your current medications as directed. Please refer to the Current Medication list given to you today.  Labwork: None ordered.  Testing/Procedures: None ordered.  Follow-Up: Your physician wants you to follow-up in: as needed with Dr. Taylor.      Any Other Special Instructions Will Be Listed Below (If Applicable).  If you need a refill on your cardiac medications before your next appointment, please call your pharmacy.   

## 2017-11-23 NOTE — Progress Notes (Signed)
HPI [name] is referred by Roderic Palau for evaluation of atrial flutter. He is a pleasant 67 yo man with a h/o HTN, obesity and CAD with an occluded LAD with collaterals. He does not have angina. He was found to be in atrial flutter 3 weeks ago. He was started on Eliquis. He has not had syncope and denies chest pain. He has undergone a 2D echo with an EF of 30%. He has global HK. The patient denies chest pain or sob. No syncope. Since his last visit, he has undergone EP study and catheter ablation of atrial flutter. He has done well in the interim. He has more energy. No palpitations.  Allergies  Allergen Reactions  . Tape Rash    If left on skin for long periods of time     Current Outpatient Medications  Medication Sig Dispense Refill  . acetaminophen (TYLENOL) 500 MG tablet Take 1,000 mg by mouth every 8 (eight) hours as needed (pain).    Marland Kitchen albuterol (PROVENTIL HFA;VENTOLIN HFA) 108 (90 Base) MCG/ACT inhaler Inhale 2 puffs into the lungs every 6 (six) hours as needed for wheezing or shortness of breath.    Marland Kitchen apixaban (ELIQUIS) 5 MG TABS tablet Take 1 tablet (5 mg total) by mouth 2 (two) times daily. 60 tablet 11  . ferrous sulfate 325 (65 FE) MG EC tablet Take 325 mg by mouth 2 (two) times daily.     . fluticasone (FLONASE) 50 MCG/ACT nasal spray Place 1 spray into both nostrils daily as needed for allergies.     . hydrochlorothiazide (HYDRODIURIL) 25 MG tablet TAKE 1 TABLET (25 MG TOTAL) BY MOUTH DAILY. PLEASE KEEP UPCOMING APPT FOR FUTURE REFILLS. THANKS 90 tablet 2  . isosorbide mononitrate (IMDUR) 30 MG 24 hr tablet TAKE ONE TABLET (30 MG TOTAL) BY MOUTH DAILY. 30 tablet 10  . metFORMIN (GLUCOPHAGE) 500 MG tablet Take 500-1,000 mg by mouth See admin instructions. Take 1000 mg in the morning and 500 mg in the evening  9  . metoprolol tartrate (LOPRESSOR) 50 MG tablet Take 0.5 tablets (25 mg total) by mouth 2 (two) times daily. 60 tablet 6  . Multiple Vitamins-Minerals  (MULTIVITAMIN WITH MINERALS) tablet Take 1 tablet by mouth daily.    . naproxen sodium (ANAPROX) 220 MG tablet Take 440 mg by mouth daily as needed (pain).     . Omega-3 Fatty Acids (FISH OIL) 1000 MG CAPS Take 1,000 mg by mouth daily.    . pramipexole (MIRAPEX) 0.125 MG tablet Take 0.125 mg by mouth at bedtime.  2  . rosuvastatin (CRESTOR) 10 MG tablet Take 1 tablet (10 mg total) by mouth daily. (Patient taking differently: Take 10 mg by mouth every evening. ) 90 tablet 3  . sodium fluoride (PREVIDENT 5000 PLUS) 1.1 % CREA dental cream Take 1 application by mouth 2 (two) times daily.     . tamsulosin (FLOMAX) 0.4 MG CAPS capsule Take 0.4 mg by mouth at bedtime.   3   No current facility-administered medications for this visit.      Past Medical History:  Diagnosis Date  . Atrial flutter with rapid ventricular response ([place]) 09/16/2017  . Cancer ([place])   . Diabetes mellitus without complication ([place])   . HTN (hypertension)   . Hyperlipidemia LDL goal <70   . OA (osteoarthritis)   . PAC (premature atrial contraction)   . Prostate cancer ([place])   . Sleep apnea     ROS:   All  systems reviewed and negative except as noted in the HPI.   Past Surgical History:  Procedure Laterality Date  . A-FLUTTER ABLATION N/A 10/18/2017   Procedure: A-FLUTTER ABLATION;  Surgeon: Evans Lance, MD;  Location: [facility];  Service: Cardiovascular;  Laterality: N/A;  . 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 GASTRIC BYPASS       Family History  Problem Relation Age of Onset  . Heart failure Mother   . Heart disease Mother   . Heart attack Mother   . Hypertension Mother   . Stroke Father   . Kidney failure Father   . Cancer Father   . Hypertension Maternal Grandmother      Social History   Socioeconomic History  . Marital status: Married    Spouse name: Not on file  . Number of children: Not on file  . Years of education:  Not on file  . Highest education level: Not on file  Occupational History  . Occupation: Theme park manager  Social Needs  . Financial resource strain: Not on file  . Food insecurity:    Worry: Not on file    Inability: Not on file  . Transportation needs:    Medical: Not on file    Non-medical: Not on file  Tobacco Use  . Smoking status: Never Smoker  . Smokeless tobacco: Never Used  Substance and Sexual Activity  . Alcohol use: Yes    Comment: occ  . Drug use: Not on file  . Sexual activity: Not on file  Lifestyle  . Physical activity:    Days per week: Not on file    Minutes per session: Not on file  . Stress: Not on file  Relationships  . Social connections:    Talks on phone: Not on file    Gets together: Not on file    Attends religious service: Not on file    Active member of club or organization: Not on file    Attends meetings of clubs or organizations: Not on file    Relationship status: Not on file  . Intimate partner violence:    Fear of current or ex partner: Not on file    Emotionally abused: Not on file    Physically abused: Not on file    Forced sexual activity: Not on file  Other Topics Concern  . 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      BP 138/70   Pulse 65   Ht 6\' 3"  (1.905 m)   Wt (!) 331 lb 6.4 oz (150.3 kg)   SpO2 98%   BMI 41.42 kg/m   Physical Exam:  Well appearing NAD HEENT: Unremarkable Neck:  No JVD, no thyromegally Lymphatics:  No adenopathy Back:  No CVA tenderness Lungs:  Clear HEART:  Regular rate rhythm, no murmurs, no rubs, no clicks Abd:  soft, positive bowel sounds, no organomegally, no rebound, no guarding Ext:  2 plus pulses, no edema, no cyanosis, no clubbing Skin:  No rashes no nodules Neuro:  CN II through XII intact, motor grossly intact  EKG - NSR  DEVICE  Normal device function.  See PaceArt for details.   Assess/Plan: 1. Atrial flutter - he is maintaining NSR after  catheter ablation. He will stop his systemic anti-coagulation. 2. Obesity - we discussed the importance of weight loss. He will try and do better.  3. HTN - his blood pressure is reasonably well controlled. I think weight loss will help him considerably.  Mikle Bosworth.D.

## 2017-11-30 ENCOUNTER — Telehealth: Payer: Self-pay | Admitting: Internal Medicine

## 2017-11-30 MED ORDER — IRBESARTAN 75 MG PO TABS: 75.0000 mg | ORAL_TABLET | Freq: Every day | ORAL | 3 refills | Status: DC

## 2017-11-30 NOTE — Telephone Encounter (Signed)
This medication was removed from Pt's med list in error at 10/05/2017 office visit with Dr. Lovena Le.  I have corrected Pt med list to reflect Pt currently taking irbesartan 75 mg one tablet by mouth daily.  Pt was reduced to 75 mg daily during hospitalization from 09/16/2017.  May refill at corrected dose.

## 2017-11-30 NOTE — Telephone Encounter (Signed)
Pt's pharmacy is requesting a refill on Irbesartan 150 mg tablet. This medication was D/C, I do not see where the provider D/C this medication. Please address

## 2017-12-08 ENCOUNTER — Telehealth: Payer: Self-pay

## 2017-12-08 DIAGNOSIS — I2510 Atherosclerotic heart disease of native coronary artery without angina pectoris: Secondary | ICD-10-CM

## 2017-12-08 DIAGNOSIS — I483 Typical atrial flutter: Secondary | ICD-10-CM

## 2017-12-08 NOTE — Telephone Encounter (Signed)
See 8/12 MyChart message. Per Dr. Burt Knack, echo and nuclear stress test ordered for scheduling. Letter released to MyChart with patient instructions.  He was grateful for call and agrees with treatment plan.

## 2017-12-08 NOTE — Telephone Encounter (Signed)
Left message to call back  

## 2017-12-12 NOTE — Telephone Encounter (Signed)
Echo has been scheduled 8/23. Myoview has been scheduled 9/16.

## 2017-12-16 ENCOUNTER — Other Ambulatory Visit: Payer: Self-pay

## 2017-12-16 ENCOUNTER — Ambulatory Visit (HOSPITAL_COMMUNITY): Payer: BLUE CROSS/BLUE SHIELD | Attending: Cardiovascular Disease

## 2017-12-16 DIAGNOSIS — I110 Hypertensive heart disease with heart failure: Secondary | ICD-10-CM | POA: Insufficient documentation

## 2017-12-16 DIAGNOSIS — I5030 Unspecified diastolic (congestive) heart failure: Secondary | ICD-10-CM | POA: Diagnosis not present

## 2017-12-16 DIAGNOSIS — I071 Rheumatic tricuspid insufficiency: Secondary | ICD-10-CM | POA: Diagnosis not present

## 2017-12-16 DIAGNOSIS — I2510 Atherosclerotic heart disease of native coronary artery without angina pectoris: Secondary | ICD-10-CM

## 2017-12-16 DIAGNOSIS — I483 Typical atrial flutter: Secondary | ICD-10-CM | POA: Diagnosis present

## 2017-12-16 DIAGNOSIS — E785 Hyperlipidemia, unspecified: Secondary | ICD-10-CM | POA: Insufficient documentation

## 2017-12-16 DIAGNOSIS — E119 Type 2 diabetes mellitus without complications: Secondary | ICD-10-CM | POA: Diagnosis not present

## 2017-12-16 MED ORDER — PERFLUTREN LIPID MICROSPHERE
1.0000 mL | INTRAVENOUS | Status: AC | PRN
  Administered 2017-12-16: 3 mL via INTRAVENOUS

## 2018-01-03 ENCOUNTER — Telehealth (HOSPITAL_COMMUNITY): Payer: Self-pay | Admitting: *Deleted

## 2018-01-03 NOTE — Telephone Encounter (Signed)
Left message on voicemail per DPR in reference to upcoming appointment scheduled on 01/09/18 at 1230 with detailed instructions given per Myocardial Perfusion Study Information Sheet for the test. LM to arrive 15 minutes early, and that it is imperative to arrive on time for appointment to keep from having the test rescheduled. If you need to cancel or reschedule your appointment, please call the office within 24 hours of your appointment. Failure to do so may result in a cancellation of your appointment, and a $50 no show fee. Phone number given for call back for any questions. Nyla Creason, Ranae Palms

## 2018-01-09 ENCOUNTER — Ambulatory Visit (HOSPITAL_COMMUNITY): Payer: BLUE CROSS/BLUE SHIELD | Attending: Cardiology

## 2018-01-09 DIAGNOSIS — I4892 Unspecified atrial flutter: Secondary | ICD-10-CM | POA: Diagnosis not present

## 2018-01-09 DIAGNOSIS — E785 Hyperlipidemia, unspecified: Secondary | ICD-10-CM | POA: Insufficient documentation

## 2018-01-09 DIAGNOSIS — I519 Heart disease, unspecified: Secondary | ICD-10-CM | POA: Insufficient documentation

## 2018-01-09 DIAGNOSIS — I2510 Atherosclerotic heart disease of native coronary artery without angina pectoris: Secondary | ICD-10-CM | POA: Diagnosis present

## 2018-01-09 DIAGNOSIS — I471 Supraventricular tachycardia: Secondary | ICD-10-CM | POA: Insufficient documentation

## 2018-01-09 DIAGNOSIS — I10 Essential (primary) hypertension: Secondary | ICD-10-CM | POA: Diagnosis not present

## 2018-01-09 DIAGNOSIS — I493 Ventricular premature depolarization: Secondary | ICD-10-CM | POA: Diagnosis not present

## 2018-01-09 DIAGNOSIS — E119 Type 2 diabetes mellitus without complications: Secondary | ICD-10-CM | POA: Insufficient documentation

## 2018-01-09 DIAGNOSIS — Z9889 Other specified postprocedural states: Secondary | ICD-10-CM | POA: Diagnosis not present

## 2018-01-09 MED ORDER — TECHNETIUM TC 99M TETROFOSMIN IV KIT
32.4000 | PACK | Freq: Once | INTRAVENOUS | Status: AC | PRN
  Administered 2018-01-09: 32.4 via INTRAVENOUS
  Filled 2018-01-09: qty 33

## 2018-01-10 ENCOUNTER — Ambulatory Visit (HOSPITAL_COMMUNITY): Payer: BLUE CROSS/BLUE SHIELD | Attending: Cardiology

## 2018-01-10 LAB — MYOCARDIAL PERFUSION IMAGING
CHL RATE OF PERCEIVED EXERTION: 18
CSEPED: 6 min
CSEPEW: 7 METS
CSEPHR: 96 %
LV dias vol: 116 mL (ref 62–150)
LV sys vol: 69 mL
MPHR: 155 {beats}/min
NUC STRESS TID: 1.03
Peak HR: 150 {beats}/min
Rest HR: 75 {beats}/min
SDS: 4
SRS: 0
SSS: 4

## 2018-01-10 MED ORDER — TECHNETIUM TC 99M TETROFOSMIN IV KIT
30.5000 | PACK | Freq: Once | INTRAVENOUS | Status: AC | PRN
  Administered 2018-01-10: 30.5 via INTRAVENOUS
  Filled 2018-01-10: qty 31

## 2018-02-10 ENCOUNTER — Ambulatory Visit (INDEPENDENT_AMBULATORY_CARE_PROVIDER_SITE_OTHER): Payer: BLUE CROSS/BLUE SHIELD | Admitting: Cardiovascular Disease

## 2018-02-10 ENCOUNTER — Encounter: Payer: Self-pay | Admitting: Cardiovascular Disease

## 2018-02-10 VITALS — BP 134/64 | HR 57 | Ht 75.0 in | Wt 331.0 lb

## 2018-02-10 DIAGNOSIS — E118 Type 2 diabetes mellitus with unspecified complications: Secondary | ICD-10-CM

## 2018-02-10 DIAGNOSIS — I2510 Atherosclerotic heart disease of native coronary artery without angina pectoris: Secondary | ICD-10-CM | POA: Diagnosis not present

## 2018-02-10 DIAGNOSIS — I483 Typical atrial flutter: Secondary | ICD-10-CM

## 2018-02-10 DIAGNOSIS — R9439 Abnormal result of other cardiovascular function study: Secondary | ICD-10-CM | POA: Diagnosis not present

## 2018-02-10 NOTE — H&P (View-Only) (Signed)
Cardiology Office Note:    Date:  02/10/2018   ID:  Garrett Welch, DOB 09/22/1950, MRN [id number]  PCP:  Tempie Hoist, MD  Cardiologist:  No primary care provider on file.  Electrophysiologist:  None   Referring MD: Tempie Hoist, MD   Chief Complaint  Patient presents with  . Shortness of Breath   History of Present Illness:    Garrett Welch is a 67 y.o. male with a hx of coronary artery disease and atrial flutter.  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  In the past these had a patent right coronary artery without significant stenosis.  LV function was normal until he developed atrial flutter with RVR and was shown to have an LVEF around 30%.  He ultimately underwent flutter ablation and with restoration of sinus rhythm his LVEF increased to range of 50 to 55%.  The patient also has a history of gastric bypass surgery.  He is here with his wife today.  He denies chest pain.  He has mild exertional dyspnea.  No orthopnea, PND, or heart palpitations.  He has discontinued anticoagulation and is now back to taking aspirin.  He complains of chronic leg swelling without change.  Past Medical History:  Diagnosis Date  . Atrial flutter with rapid ventricular response ([place]) 09/16/2017  . Cancer ([place])   . Diabetes mellitus without complication ([place])   . HTN (hypertension)   . Hyperlipidemia LDL goal <70   . OA (osteoarthritis)   . PAC (premature atrial contraction)   . Prostate cancer ([place])   . Sleep apnea     Past Surgical History:  Procedure Laterality Date  . A-FLUTTER ABLATION N/A 10/18/2017   Procedure: A-FLUTTER ABLATION;  Surgeon: Evans Lance, MD;  Location: [facility];  Service: Cardiovascular;  Laterality: N/A;  . CARDIAC CATHETERIZATION  09/2014   [facility] in [place], LAD 100%, anomalous CFX 40-50%, RCA patent  . GASTRIC BYPASS      Current  Medications: Current Meds  Medication Sig  . acetaminophen (TYLENOL) 500 MG tablet Take 1,000 mg by mouth every 8 (eight) hours as needed (pain).  Marland Kitchen albuterol (PROVENTIL HFA;VENTOLIN HFA) 108 (90 Base) MCG/ACT inhaler Inhale 2 puffs into the lungs every 6 (six) hours as needed for wheezing or shortness of breath.  Marland Kitchen aspirin EC 81 MG tablet Take 81 mg by mouth daily.  . ferrous sulfate 325 (65 FE) MG EC tablet Take 325 mg by mouth 2 (two) times daily.   . fluticasone (FLONASE) 50 MCG/ACT nasal spray Place 1 spray into both nostrils daily as needed for allergies.   . hydrochlorothiazide (HYDRODIURIL) 25 MG tablet TAKE 1 TABLET (25 MG TOTAL) BY MOUTH DAILY. PLEASE KEEP UPCOMING APPT FOR FUTURE REFILLS. THANKS  . irbesartan (AVAPRO) 75 MG tablet Take 1 tablet (75 mg total) by mouth daily.  . isosorbide mononitrate (IMDUR) 30 MG 24 hr tablet TAKE ONE TABLET (30 MG TOTAL) BY MOUTH DAILY.  . metFORMIN (GLUCOPHAGE) 500 MG tablet Take 500-1,000 mg by mouth See admin instructions. Take 1000 mg in the morning and 500 mg in the evening  . metoprolol tartrate (LOPRESSOR) 50 MG tablet Take 0.5 tablets (25 mg total) by mouth 2 (two) times daily.  . Multiple Vitamins-Minerals (MULTIVITAMIN WITH MINERALS) tablet Take 1 tablet by mouth daily.  . naproxen sodium (ANAPROX) 220 MG tablet Take 440 mg by mouth daily as needed (pain).   Marland Kitchen  Omega-3 Fatty Acids (FISH OIL) 1000 MG CAPS Take 1,000 mg by mouth daily.  . pramipexole (MIRAPEX) 0.125 MG tablet Take 0.125 mg by mouth at bedtime.  . rosuvastatin (CRESTOR) 10 MG tablet Take 10 mg by mouth every evening.  . sodium fluoride (PREVIDENT 5000 PLUS) 1.1 % CREA dental cream Take 1 application by mouth 2 (two) times daily.   . tamsulosin (FLOMAX) 0.4 MG CAPS capsule Take 0.4 mg by mouth at bedtime.   . [DISCONTINUED] rosuvastatin (CRESTOR) 10 MG tablet Take 1 tablet (10 mg total) by mouth daily. (Patient taking differently: Take 10 mg by mouth every evening. )      Allergies:   Tape   Social History   Socioeconomic History  . Marital status: Married    Spouse name: Not on file  . Number of children: Not on file  . Years of education: Not on file  . Highest education level: Not on file  Occupational History  . Occupation: Theme park manager  Social Needs  . Financial resource strain: Not on file  . Food insecurity:    Worry: Not on file    Inability: Not on file  . Transportation needs:    Medical: Not on file    Non-medical: Not on file  Tobacco Use  . Smoking status: Never Smoker  . Smokeless tobacco: Never Used  Substance and Sexual Activity  . Alcohol use: Yes    Comment: occ  . Drug use: Not on file  . Sexual activity: Not on file  Lifestyle  . Physical activity:    Days per week: Not on file    Minutes per session: Not on file  . Stress: Not on file  Relationships  . Social connections:    Talks on phone: Not on file    Gets together: Not on file    Attends religious service: Not on file    Active member of club or organization: Not on file    Attends meetings of clubs or organizations: Not on file    Relationship status: Not on file  Other Topics Concern  . 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      Family History: The patient's family history includes Cancer in his father; Heart attack in his mother; Heart disease in his mother; Heart failure in his mother; Hypertension in his maternal grandmother and mother; Kidney failure in his father; Stroke in his father.  ROS:   Please see the history of present illness.    All other systems reviewed and are negative.  EKGs/Labs/Other Studies Reviewed:    The following studies were reviewed today: Echo 12-16-2017: Study Conclusions  - Left ventricle: The cavity size was normal. There was moderate   concentric hypertrophy. Systolic function was normal. The   estimated ejection fraction was in the range of 50% to 55%.   Doppler  parameters are consistent with abnormal left ventricular   relaxation (grade 1 diastolic dysfunction). Doppler parameters   are consistent with elevated ventricular end-diastolic filling   pressure. - Aortic valve: There was no regurgitation. - Ascending aorta: The ascending aorta was mildly dilated measuring   40 mm. - Mitral valve: Calcified annulus. Mildly thickened leaflets . - Right ventricle: The cavity size was normal. Wall thickness was   normal. Systolic pressure was within the normal range. - Tricuspid valve: There was trivial regurgitation. - Pulmonary arteries: Systolic pressure was within the normal   range. - Inferior vena  cava: The vessel was normal in size. The   respirophasic diameter changes were in the normal range (>= 50%),   consistent with normal central venous pressure.  Impressions:  - Since the last study on 09/28/2017 LVEF has improved from 30-35% to   50-55%.  Myocardial Perfusion Study 01-10-2018: Study Highlights     Nuclear stress EF: 41%.  Blood pressure demonstrated a normal response to exercise.  There was no ST segment deviation noted during stress.  Defect 1: There is a medium defect of moderate severity present in the apical anterior, apical septal, apical inferior, apical lateral and apex location.  This is an intermediate risk study.  Findings consistent with ischemia.  The left ventricular ejection fraction is moderately decreased (30-44%).   Abnormal, intermediate risk stress nuclear study with apical thinning and superimposed mild to moderate ischemia in the apex.  The gated ejection fraction is 41% though visually appears better.  Hypokinesis of the distal septum and apex.  Mild left ventricular enlargement.    EKG:  EKG is ordered today.  The ekg ordered today demonstrates sinus bradycardia 57 bpm with PACs, left anterior fascicular block.  Recent Labs: 09/16/2017: TSH 1.622 10/05/2017: BUN 21; Creatinine, Ser 0.96; Hemoglobin  14.3; Platelets 278; Potassium 4.2; Sodium 140  Recent Lipid Panel    Component Value Date/Time   CHOL 141 06/05/2015 0855   TRIG 65 06/05/2015 0855   HDL 47 06/05/2015 0855   CHOLHDL 3.0 06/05/2015 0855   VLDL 13 06/05/2015 0855   LDLCALC 81 06/05/2015 0855    Physical Exam:    VS:  BP 134/64   Pulse (!) 57   Ht 6\' 3"  (1.905 m)   Wt (!) 331 lb (150.1 kg)   BMI 41.37 kg/m     Wt Readings from Last 3 Encounters:  02/10/18 (!) 331 lb (150.1 kg)  01/09/18 (!) 331 lb (150.1 kg)  11/23/17 (!) 331 lb 6.4 oz (150.3 kg)     GEN: Pleasant morbidly obese male in no acute distress HEENT: Normal NECK: No JVD; No carotid bruits LYMPHATICS: No lymphadenopathy CARDIAC: RRR, no murmurs, rubs, gallops RESPIRATORY:  Clear to auscultation without rales, wheezing or rhonchi  ABDOMEN: Soft, non-tender, non-distended MUSCULOSKELETAL: Trace bilateral pretibial edema; No deformity  SKIN: Mild stasis changes in the lower legs bilaterally NEUROLOGIC:  Alert and oriented x 3 PSYCHIATRIC:  Normal affect   ASSESSMENT:    1. Abnormal stress test   2. Coronary artery disease involving native coronary artery of native heart without angina pectoris   3. Morbid obesity ([place])   4. Type 2 diabetes mellitus with complication, without long-term current use of insulin ([place])   5. Typical atrial flutter ([facility])    PLAN:    In order of problems listed above:  1. The patient is a 67 year old diabetic morbidly obese gentleman with known chronic occlusion of the proximal LAD.  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  LV function is now improved.  However, nuclear scan demonstrates anteroapical ischemia consistent with the patient's LAD occlusion.  I recommended that we proceed with cardiac catheterization to evaluate for progressive coronary artery disease and further assess revascularization options.  It might be reasonable to consider CTO  intervention versus CABG depending on cardiac catheterization findings.  The patient is counseled extensively and agrees to proceed. I have reviewed the risks, indications, and alternatives to cardiac catheterization, possible angioplasty, and stenting with the patient. Risks include but are not  limited to bleeding, infection, vascular injury, stroke, myocardial infection, arrhythmia, kidney injury, radiation-related injury in the case of prolonged fluoroscopy use, emergency cardiac surgery, and death. The patient understands the risks of serious complication is 1-2 in 9509 with diagnostic cardiac cath and 1-2% or less with angioplasty/stenting.  2. As above, plans for cardiac catheterization.  Medications will be continued without change. 3. Lifestyle modification as discussed.  He understands the need to lose weight.  We talked about initiation of a modest exercise program. 4. Diabetes is managed by his primary physician.  He will work on weight loss and exercise. 5. Patient is in sinus rhythm.  He is off of anticoagulation after undergoing atrial flutter ablation.   Medication Adjustments/Labs and Tests Ordered: Current medicines are reviewed at length with the patient today.  Concerns regarding medicines are outlined above.  Orders Placed This Encounter  Procedures  . Basic metabolic panel  . CBC with Differential/Platelet  . EKG 12-Lead   No orders of the defined types were placed in this encounter.   Patient Instructions  Medication Instructions:  Your provider recommends that you continue on your current medications as directed. Please refer to the Current Medication list given to you today.    Labwork: TODAY: BMET, CBC  Testing/Procedures: Your physician has requested that you have a cardiac catheterization. Cardiac catheterization is used to diagnose and/or treat various heart conditions. Doctors may recommend this procedure for a number of different reasons. The most common reason  is to evaluate chest pain. Chest pain can be a symptom of coronary artery disease (CAD), and cardiac catheterization can show whether plaque is narrowing or blocking your heart's arteries. This procedure is also used to evaluate the valves, as well as measure the blood flow and oxygen levels in different parts of your heart. For further information please visit [email or web address].[email or web address]. Please follow instruction sheet, as given.  Follow-Up: Your provider recommends that you schedule a follow-up appointment AS NEEDED pending study results.  Any Other Special Instructions Will Be Listed Below (If Applicable).    [facility] OFFICE [place], [place] [address] Dept: [place]: [phone]  Aneesh Faller Welch  02/10/2018  You are scheduled for a Cardiac Catheterization on Wednesday, October 30 with Dr. Sherren Mocha.  1. Please arrive at the [facility] (Main Entrance A) at [facility]: [address] [place], [place] [address] at 6:30 AM (This time is two hours before your procedure to ensure your preparation). Free valet parking service is available.   Special note: Every effort is made to have your procedure done on time. Please understand that emergencies sometimes delay scheduled procedures.  2. Diet: Do not eat solid foods after midnight.  The patient may have clear liquids until 5am upon the day of the procedure.  3. Labs: Today!  4. Medication instructions in preparation for your procedure:  1) HOLD METFORMIN the morning before and morning of your cath. You may resume 48 hours after your procedure.  2) HOLD HCTZ the morning of your cath.  3) TAKE ASPIRIN 81 MG THE MORNING OF YOUR CATH.  5. Plan for one night stay--bring personal belongings. 6. Bring a current list of your medications and current insurance cards. 7. You MUST have a responsible person to drive you  home. 8. Someone MUST be with you the first 24 hours after you arrive home or your discharge will be delayed. 9. Please wear  clothes that are easy to get on and off and wear slip-on shoes.  Thank you for allowing [place] to care for you!   -- [facility] services     Signed, Sherren Mocha, MD  02/10/2018 5:38 PM    [place]

## 2018-02-10 NOTE — Patient Instructions (Addendum)
Medication Instructions:  Your provider recommends that you continue on your current medications as directed. Please refer to the Current Medication list given to you today.    Labwork: TODAY: BMET, CBC  Testing/Procedures: Your physician has requested that you have a cardiac catheterization. Cardiac catheterization is used to diagnose and/or treat various heart conditions. Doctors may recommend this procedure for a number of different reasons. The most common reason is to evaluate chest pain. Chest pain can be a symptom of coronary artery disease (CAD), and cardiac catheterization can show whether plaque is narrowing or blocking your heart's arteries. This procedure is also used to evaluate the valves, as well as measure the blood flow and oxygen levels in different parts of your heart. For further information please visit [email or web address].[email or web address]. Please follow instruction sheet, as given.  Follow-Up: Your provider recommends that you schedule a follow-up appointment AS NEEDED pending study results.  Any Other Special Instructions Will Be Listed Below (If Applicable).    [place] OFFICE [place], [place] [address] Dept: [place]: [phone]  Damian Buckles III  02/10/2018  You are scheduled for a Cardiac Catheterization on Wednesday, October 30 with Dr. Sherren Mocha.  1. Please arrive at the [facility] (Main Entrance A) at [facility]: [address] [place], [place] [address] at 6:30 AM (This time is two hours before your procedure to ensure your preparation). Free valet parking service is available.   Special note: Every effort is made to have your procedure done on time. Please understand that emergencies sometimes delay scheduled procedures.  2. Diet: Do not eat solid foods after midnight.  The patient may have clear liquids until 5am upon the day of the  procedure.  3. Labs: Today!  4. Medication instructions in preparation for your procedure:  1) HOLD METFORMIN the morning before and morning of your cath. You may resume 48 hours after your procedure.  2) HOLD HCTZ the morning of your cath.  3) TAKE ASPIRIN 81 MG THE MORNING OF YOUR CATH.  5. Plan for one night stay--bring personal belongings. 6. Bring a current list of your medications and current insurance cards. 7. You MUST have a responsible person to drive you home. 8. Someone MUST be with you the first 24 hours after you arrive home or your discharge will be delayed. 9. Please wear clothes that are easy to get on and off and wear slip-on shoes.  Thank you for allowing [place] to care for you!   -- [place] [facility]

## 2018-02-10 NOTE — Progress Notes (Signed)
Cardiology Office Note:    Date:  02/10/2018   ID:  Garrett Welch, DOB 02/14/1951, MRN [id number]  PCP:  Tempie Hoist, MD  Cardiologist:  No primary care provider on file.  Electrophysiologist:  None   Referring MD: Tempie Hoist, MD   Chief Complaint  Patient presents with  . Shortness of Breath   History of Present Illness:    Garrett Welch is a 67 y.o. male with a hx of coronary artery disease and atrial flutter.  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  In the past these had a patent right coronary artery without significant stenosis.  LV function was normal until he developed atrial flutter with RVR and was shown to have an LVEF around 30%.  He ultimately underwent flutter ablation and with restoration of sinus rhythm his LVEF increased to range of 50 to 55%.  The patient also has a history of gastric bypass surgery.  He is here with his wife today.  He denies chest pain.  He has mild exertional dyspnea.  No orthopnea, PND, or heart palpitations.  He has discontinued anticoagulation and is now back to taking aspirin.  He complains of chronic leg swelling without change.  Past Medical History:  Diagnosis Date  . Atrial flutter with rapid ventricular response ([place]) 09/16/2017  . Cancer ([place])   . Diabetes mellitus without complication ([place])   . HTN (hypertension)   . Hyperlipidemia LDL goal <70   . OA (osteoarthritis)   . PAC (premature atrial contraction)   . Prostate cancer ([place])   . Sleep apnea     Past Surgical History:  Procedure Laterality Date  . A-FLUTTER ABLATION N/A 10/18/2017   Procedure: A-FLUTTER ABLATION;  Surgeon: Evans Lance, MD;  Location: [facility];  Service: Cardiovascular;  Laterality: N/A;  . CARDIAC CATHETERIZATION  09/2014   [facility] in [place], LAD 100%, anomalous CFX 40-50%, RCA patent  . GASTRIC BYPASS      Current  Medications: Current Meds  Medication Sig  . acetaminophen (TYLENOL) 500 MG tablet Take 1,000 mg by mouth every 8 (eight) hours as needed (pain).  Marland Kitchen albuterol (PROVENTIL HFA;VENTOLIN HFA) 108 (90 Base) MCG/ACT inhaler Inhale 2 puffs into the lungs every 6 (six) hours as needed for wheezing or shortness of breath.  Marland Kitchen aspirin EC 81 MG tablet Take 81 mg by mouth daily.  . ferrous sulfate 325 (65 FE) MG EC tablet Take 325 mg by mouth 2 (two) times daily.   . fluticasone (FLONASE) 50 MCG/ACT nasal spray Place 1 spray into both nostrils daily as needed for allergies.   . hydrochlorothiazide (HYDRODIURIL) 25 MG tablet TAKE 1 TABLET (25 MG TOTAL) BY MOUTH DAILY. PLEASE KEEP UPCOMING APPT FOR FUTURE REFILLS. THANKS  . irbesartan (AVAPRO) 75 MG tablet Take 1 tablet (75 mg total) by mouth daily.  . isosorbide mononitrate (IMDUR) 30 MG 24 hr tablet TAKE ONE TABLET (30 MG TOTAL) BY MOUTH DAILY.  . metFORMIN (GLUCOPHAGE) 500 MG tablet Take 500-1,000 mg by mouth See admin instructions. Take 1000 mg in the morning and 500 mg in the evening  . metoprolol tartrate (LOPRESSOR) 50 MG tablet Take 0.5 tablets (25 mg total) by mouth 2 (two) times daily.  . Multiple Vitamins-Minerals (MULTIVITAMIN WITH MINERALS) tablet Take 1 tablet by mouth daily.  . naproxen sodium (ANAPROX) 220 MG tablet Take 440 mg by mouth daily as needed (pain).   Marland Kitchen  Omega-3 Fatty Acids (FISH OIL) 1000 MG CAPS Take 1,000 mg by mouth daily.  . pramipexole (MIRAPEX) 0.125 MG tablet Take 0.125 mg by mouth at bedtime.  . rosuvastatin (CRESTOR) 10 MG tablet Take 10 mg by mouth every evening.  . sodium fluoride (PREVIDENT 5000 PLUS) 1.1 % CREA dental cream Take 1 application by mouth 2 (two) times daily.   . tamsulosin (FLOMAX) 0.4 MG CAPS capsule Take 0.4 mg by mouth at bedtime.   . [DISCONTINUED] rosuvastatin (CRESTOR) 10 MG tablet Take 1 tablet (10 mg total) by mouth daily. (Patient taking differently: Take 10 mg by mouth every evening. )      Allergies:   Tape   Social History   Socioeconomic History  . Marital status: Married    Spouse name: Not on file  . Number of children: Not on file  . Years of education: Not on file  . Highest education level: Not on file  Occupational History  . Occupation: Theme park manager  Social Needs  . Financial resource strain: Not on file  . Food insecurity:    Worry: Not on file    Inability: Not on file  . Transportation needs:    Medical: Not on file    Non-medical: Not on file  Tobacco Use  . Smoking status: Never Smoker  . Smokeless tobacco: Never Used  Substance and Sexual Activity  . Alcohol use: Yes    Comment: occ  . Drug use: Not on file  . Sexual activity: Not on file  Lifestyle  . Physical activity:    Days per week: Not on file    Minutes per session: Not on file  . Stress: Not on file  Relationships  . Social connections:    Talks on phone: Not on file    Gets together: Not on file    Attends religious service: Not on file    Active member of club or organization: Not on file    Attends meetings of clubs or organizations: Not on file    Relationship status: Not on file  Other Topics Concern  . 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      Family History: The patient's family history includes Cancer in his father; Heart attack in his mother; Heart disease in his mother; Heart failure in his mother; Hypertension in his maternal grandmother and mother; Kidney failure in his father; Stroke in his father.  ROS:   Please see the history of present illness.    All other systems reviewed and are negative.  EKGs/Labs/Other Studies Reviewed:    The following studies were reviewed today: Echo 12-16-2017: Study Conclusions  - Left ventricle: The cavity size was normal. There was moderate   concentric hypertrophy. Systolic function was normal. The   estimated ejection fraction was in the range of 50% to 55%.   Doppler  parameters are consistent with abnormal left ventricular   relaxation (grade 1 diastolic dysfunction). Doppler parameters   are consistent with elevated ventricular end-diastolic filling   pressure. - Aortic valve: There was no regurgitation. - Ascending aorta: The ascending aorta was mildly dilated measuring   40 mm. - Mitral valve: Calcified annulus. Mildly thickened leaflets . - Right ventricle: The cavity size was normal. Wall thickness was   normal. Systolic pressure was within the normal range. - Tricuspid valve: There was trivial regurgitation. - Pulmonary arteries: Systolic pressure was within the normal   range. - Inferior vena  cava: The vessel was normal in size. The   respirophasic diameter changes were in the normal range (>= 50%),   consistent with normal central venous pressure.  Impressions:  - Since the last study on 09/28/2017 LVEF has improved from 30-35% to   50-55%.  Myocardial Perfusion Study 01-10-2018: Study Highlights     Nuclear stress EF: 41%.  Blood pressure demonstrated a normal response to exercise.  There was no ST segment deviation noted during stress.  Defect 1: There is a medium defect of moderate severity present in the apical anterior, apical septal, apical inferior, apical lateral and apex location.  This is an intermediate risk study.  Findings consistent with ischemia.  The left ventricular ejection fraction is moderately decreased (30-44%).   Abnormal, intermediate risk stress nuclear study with apical thinning and superimposed mild to moderate ischemia in the apex.  The gated ejection fraction is 41% though visually appears better.  Hypokinesis of the distal septum and apex.  Mild left ventricular enlargement.    EKG:  EKG is ordered today.  The ekg ordered today demonstrates sinus bradycardia 57 bpm with PACs, left anterior fascicular block.  Recent Labs: 09/16/2017: TSH 1.622 10/05/2017: BUN 21; Creatinine, Ser 0.96; Hemoglobin  14.3; Platelets 278; Potassium 4.2; Sodium 140  Recent Lipid Panel    Component Value Date/Time   CHOL 141 06/05/2015 0855   TRIG 65 06/05/2015 0855   HDL 47 06/05/2015 0855   CHOLHDL 3.0 06/05/2015 0855   VLDL 13 06/05/2015 0855   LDLCALC 81 06/05/2015 0855    Physical Exam:    VS:  BP 134/64   Pulse (!) 57   Ht 6\' 3"  (1.905 m)   Wt (!) 331 lb (150.1 kg)   BMI 41.37 kg/m     Wt Readings from Last 3 Encounters:  02/10/18 (!) 331 lb (150.1 kg)  01/09/18 (!) 331 lb (150.1 kg)  11/23/17 (!) 331 lb 6.4 oz (150.3 kg)     GEN: Pleasant morbidly obese male in no acute distress HEENT: Normal NECK: No JVD; No carotid bruits LYMPHATICS: No lymphadenopathy CARDIAC: RRR, no murmurs, rubs, gallops RESPIRATORY:  Clear to auscultation without rales, wheezing or rhonchi  ABDOMEN: Soft, non-tender, non-distended MUSCULOSKELETAL: Trace bilateral pretibial edema; No deformity  SKIN: Mild stasis changes in the lower legs bilaterally NEUROLOGIC:  Alert and oriented x 3 PSYCHIATRIC:  Normal affect   ASSESSMENT:    1. Abnormal stress test   2. Coronary artery disease involving native coronary artery of native heart without angina pectoris   3. Morbid obesity ([place])   4. Type 2 diabetes mellitus with complication, without long-term current use of insulin ([place])   5. Typical atrial flutter ([facility])    PLAN:    In order of problems listed above:  1. The patient is a 67 year old diabetic morbidly obese gentleman with known chronic occlusion of the proximal LAD.  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  LV function is now improved.  However, nuclear scan demonstrates anteroapical ischemia consistent with the patient's LAD occlusion.  I recommended that we proceed with cardiac catheterization to evaluate for progressive coronary artery disease and further assess revascularization options.  It might be reasonable to consider CTO  intervention versus CABG depending on cardiac catheterization findings.  The patient is counseled extensively and agrees to proceed. I have reviewed the risks, indications, and alternatives to cardiac catheterization, possible angioplasty, and stenting with the patient. Risks include but are not  limited to bleeding, infection, vascular injury, stroke, myocardial infection, arrhythmia, kidney injury, radiation-related injury in the case of prolonged fluoroscopy use, emergency cardiac surgery, and death. The patient understands the risks of serious complication is 1-2 in 2409 with diagnostic cardiac cath and 1-2% or less with angioplasty/stenting.  2. As above, plans for cardiac catheterization.  Medications will be continued without change. 3. Lifestyle modification as discussed.  He understands the need to lose weight.  We talked about initiation of a modest exercise program. 4. Diabetes is managed by his primary physician.  He will work on weight loss and exercise. 5. Patient is in sinus rhythm.  He is off of anticoagulation after undergoing atrial flutter ablation.   Medication Adjustments/Labs and Tests Ordered: Current medicines are reviewed at length with the patient today.  Concerns regarding medicines are outlined above.  Orders Placed This Encounter  Procedures  . Basic metabolic panel  . CBC with Differential/Platelet  . EKG 12-Lead   No orders of the defined types were placed in this encounter.   Patient Instructions  Medication Instructions:  Your provider recommends that you continue on your current medications as directed. Please refer to the Current Medication list given to you today.    Labwork: TODAY: BMET, CBC  Testing/Procedures: Your physician has requested that you have a cardiac catheterization. Cardiac catheterization is used to diagnose and/or treat various heart conditions. Doctors may recommend this procedure for a number of different reasons. The most common reason  is to evaluate chest pain. Chest pain can be a symptom of coronary artery disease (CAD), and cardiac catheterization can show whether plaque is narrowing or blocking your heart's arteries. This procedure is also used to evaluate the valves, as well as measure the blood flow and oxygen levels in different parts of your heart. For further information please visit [email or web address]. Please follow instruction sheet, as given.  Follow-Up: Your provider recommends that you schedule a follow-up appointment AS NEEDED pending study results.  Any Other Special Instructions Will Be Listed Below (If Applicable).    [place] [facility] OFFICE [place], [place] [address] Dept: [place]: [phone]  Garrett Welch  02/10/2018  You are scheduled for a Cardiac Catheterization on Wednesday, October 30 with Dr. Sherren Mocha.  1. Please arrive at the [facility] (Main Entrance A) at [facility]: [address] [place], [place] [address] at 6:30 AM (This time is two hours before your procedure to ensure your preparation). Free valet parking service is available.   Special note: Every effort is made to have your procedure done on time. Please understand that emergencies sometimes delay scheduled procedures.  2. Diet: Do not eat solid foods after midnight.  The patient may have clear liquids until 5am upon the day of the procedure.  3. Labs: Today!  4. Medication instructions in preparation for your procedure:  1) HOLD METFORMIN the morning before and morning of your cath. You may resume 48 hours after your procedure.  2) HOLD HCTZ the morning of your cath.  3) TAKE ASPIRIN 81 MG THE MORNING OF YOUR CATH.  5. Plan for one night stay--bring personal belongings. 6. Bring a current list of your medications and current insurance cards. 7. You MUST have a responsible person to drive you  home. 8. Someone MUST be with you the first 24 hours after you arrive home or your discharge will be delayed. 9. Please wear  clothes that are easy to get on and off and wear slip-on shoes.  Thank you for allowing [place] to care for you!   -- [facility] Invasive Cardiovascular services     Signed, Sherren Mocha, MD  02/10/2018 5:38 PM    [place]

## 2018-02-11 LAB — BASIC METABOLIC PANEL
BUN / CREAT RATIO: 26 — AB (ref 10–24)
BUN: 21 mg/dL (ref 8–27)
CO2: 22 mmol/L (ref 20–29)
CREATININE: 0.82 mg/dL (ref 0.76–1.27)
Calcium: 9.6 mg/dL (ref 8.6–10.2)
Chloride: 101 mmol/L (ref 96–106)
GFR calc Af Amer: 107 mL/min/{1.73_m2} (ref >59)
GFR calc non Af Amer: 93 mL/min/{1.73_m2} (ref >59)
GLUCOSE: 92 mg/dL (ref 65–99)
Potassium: 4.7 mmol/L (ref 3.5–5.2)
SODIUM: 143 mmol/L (ref 134–144)

## 2018-02-11 LAB — CBC WITH DIFFERENTIAL/PLATELET
BASOS: 1 %
Basophils Absolute: 0.1 10*3/uL (ref 0.0–0.2)
EOS (ABSOLUTE): 0.7 10*3/uL — AB (ref 0.0–0.4)
EOS: 6 %
HEMATOCRIT: 43.3 % (ref 37.5–51.0)
Hemoglobin: 14.9 g/dL (ref 13.0–17.7)
Immature Grans (Abs): 0 10*3/uL (ref 0.0–0.1)
Immature Granulocytes: 0 %
LYMPHS ABS: 2.6 10*3/uL (ref 0.7–3.1)
Lymphs: 22 %
MCH: 30.3 pg (ref 26.6–33.0)
MCHC: 34.4 g/dL (ref 31.5–35.7)
MCV: 88 fL (ref 79–97)
MONOS ABS: 1 10*3/uL — AB (ref 0.1–0.9)
Monocytes: 8 %
NEUTROS PCT: 63 %
Neutrophils Absolute: 7.4 10*3/uL — ABNORMAL HIGH (ref 1.4–7.0)
PLATELETS: 291 10*3/uL (ref 150–450)
RBC: 4.92 x10E6/uL (ref 4.14–5.80)
RDW: 12.7 % (ref 12.3–15.4)
WBC: 11.7 10*3/uL — AB (ref 3.4–10.8)

## 2018-02-20 ENCOUNTER — Telehealth: Payer: Self-pay | Admitting: *Deleted

## 2018-02-20 NOTE — Telephone Encounter (Addendum)
Pt contacted pre-catheterization scheduled at [facility] for: Wednesday February 22, 2018 8:30 AM Verified arrival time and place: [place] Entrance A at: 6:30 AM  No solid food after midnight prior to cath, clear liquids until 5 AM day of procedure. Contrast allergy: no  Hold: Metformin-day of procedure and 48 hours post procedure. HCTZ-AM of procedure.   Except hold medications AM meds can be  taken pre-cath with sip of water including: ASA 81 mg  Confirm patient has responsible person to drive home post procedure and for 24 hours after you arrive home: yes

## 2018-02-20 NOTE — Telephone Encounter (Signed)
Per Dr Apolonio Schneiders hydration rate-300 ml/hr for 1 hour followed by 100 ml/hr continuous.

## 2018-02-20 NOTE — Telephone Encounter (Signed)
LMTCB to discuss instructions 

## 2018-02-22 ENCOUNTER — Other Ambulatory Visit: Payer: Self-pay

## 2018-02-22 ENCOUNTER — Encounter (HOSPITAL_COMMUNITY): Payer: Self-pay | Admitting: Cardiovascular Disease

## 2018-02-22 ENCOUNTER — Encounter (HOSPITAL_COMMUNITY)
Admission: RE | Disposition: A | Discharge status: Home / Self Care | Payer: Self-pay | Source: Ambulatory Visit | Attending: Cardiovascular Disease

## 2018-02-22 ENCOUNTER — Ambulatory Visit (HOSPITAL_COMMUNITY)
Admission: RE | Admit: 2018-02-22 | Discharge: 2018-02-22 | Disposition: A | Discharge status: Home / Self Care | Payer: BLUE CROSS/BLUE SHIELD | Source: Ambulatory Visit | Attending: Cardiovascular Disease | Admitting: Cardiovascular Disease

## 2018-02-22 DIAGNOSIS — Z79899 Other long term (current) drug therapy: Secondary | ICD-10-CM | POA: Diagnosis not present

## 2018-02-22 DIAGNOSIS — Z7982 Long term (current) use of aspirin: Secondary | ICD-10-CM | POA: Diagnosis not present

## 2018-02-22 DIAGNOSIS — Z823 Family history of stroke: Secondary | ICD-10-CM | POA: Diagnosis not present

## 2018-02-22 DIAGNOSIS — M1990 Unspecified osteoarthritis, unspecified site: Secondary | ICD-10-CM | POA: Diagnosis not present

## 2018-02-22 DIAGNOSIS — R931 Abnormal findings on diagnostic imaging of heart and coronary circulation: Secondary | ICD-10-CM | POA: Diagnosis present

## 2018-02-22 DIAGNOSIS — Z809 Family history of malignant neoplasm, unspecified: Secondary | ICD-10-CM | POA: Diagnosis not present

## 2018-02-22 DIAGNOSIS — I491 Atrial premature depolarization: Secondary | ICD-10-CM | POA: Diagnosis not present

## 2018-02-22 DIAGNOSIS — G4730 Sleep apnea, unspecified: Secondary | ICD-10-CM | POA: Insufficient documentation

## 2018-02-22 DIAGNOSIS — Z9884 Bariatric surgery status: Secondary | ICD-10-CM | POA: Insufficient documentation

## 2018-02-22 DIAGNOSIS — E785 Hyperlipidemia, unspecified: Secondary | ICD-10-CM | POA: Diagnosis not present

## 2018-02-22 DIAGNOSIS — I2510 Atherosclerotic heart disease of native coronary artery without angina pectoris: Secondary | ICD-10-CM

## 2018-02-22 DIAGNOSIS — R0602 Shortness of breath: Secondary | ICD-10-CM | POA: Diagnosis not present

## 2018-02-22 DIAGNOSIS — I483 Typical atrial flutter: Secondary | ICD-10-CM | POA: Insufficient documentation

## 2018-02-22 DIAGNOSIS — I10 Essential (primary) hypertension: Secondary | ICD-10-CM | POA: Insufficient documentation

## 2018-02-22 DIAGNOSIS — Z6841 Body Mass Index (BMI) 40.0 and over, adult: Secondary | ICD-10-CM | POA: Insufficient documentation

## 2018-02-22 DIAGNOSIS — E119 Type 2 diabetes mellitus without complications: Secondary | ICD-10-CM | POA: Insufficient documentation

## 2018-02-22 DIAGNOSIS — Z7984 Long term (current) use of oral hypoglycemic drugs: Secondary | ICD-10-CM | POA: Insufficient documentation

## 2018-02-22 DIAGNOSIS — I2582 Chronic total occlusion of coronary artery: Secondary | ICD-10-CM | POA: Diagnosis not present

## 2018-02-22 DIAGNOSIS — Z888 Allergy status to other drugs, medicaments and biological substances status: Secondary | ICD-10-CM | POA: Diagnosis not present

## 2018-02-22 DIAGNOSIS — Z8249 Family history of ischemic heart disease and other diseases of the circulatory system: Secondary | ICD-10-CM | POA: Diagnosis not present

## 2018-02-22 DIAGNOSIS — Z8546 Personal history of malignant neoplasm of prostate: Secondary | ICD-10-CM | POA: Insufficient documentation

## 2018-02-22 DIAGNOSIS — Z9889 Other specified postprocedural states: Secondary | ICD-10-CM | POA: Diagnosis not present

## 2018-02-22 DIAGNOSIS — I429 Cardiomyopathy, unspecified: Secondary | ICD-10-CM | POA: Insufficient documentation

## 2018-02-22 HISTORY — PX: LEFT HEART CATH AND CORONARY ANGIOGRAPHY: CATH118249

## 2018-02-22 LAB — GLUCOSE, CAPILLARY: Glucose-Capillary: 166 mg/dL — ABNORMAL HIGH (ref 70–99)

## 2018-02-22 SURGERY — LEFT HEART CATH AND CORONARY ANGIOGRAPHY
Anesthesia: LOCAL

## 2018-02-22 MED ORDER — IOHEXOL 350 MG/ML SOLN
INTRAVENOUS | Status: DC | PRN
  Administered 2018-02-22: 75 mL via INTRA_ARTERIAL

## 2018-02-22 MED ORDER — LIDOCAINE HCL (PF) 1 % IJ SOLN
INTRAMUSCULAR | Status: AC
  Filled 2018-02-22: qty 30

## 2018-02-22 MED ORDER — SODIUM CHLORIDE 0.9% FLUSH: 3.0000 mL | Freq: Two times a day (BID) | INTRAVENOUS | Status: DC

## 2018-02-22 MED ORDER — SODIUM CHLORIDE 0.9 % IV SOLN: INTRAVENOUS | Status: DC

## 2018-02-22 MED ORDER — HEPARIN (PORCINE) IN NACL 1000-0.9 UT/500ML-% IV SOLN
INTRAVENOUS | Status: AC
  Filled 2018-02-22: qty 1000

## 2018-02-22 MED ORDER — LIDOCAINE HCL (PF) 1 % IJ SOLN
INTRAMUSCULAR | Status: DC | PRN
  Administered 2018-02-22: 2 mL via INTRADERMAL

## 2018-02-22 MED ORDER — SODIUM CHLORIDE 0.9% FLUSH: 3.0000 mL | INTRAVENOUS | Status: DC | PRN

## 2018-02-22 MED ORDER — FENTANYL CITRATE (PF) 100 MCG/2ML IJ SOLN
INTRAMUSCULAR | Status: DC | PRN
  Administered 2018-02-22: 25 ug via INTRAVENOUS

## 2018-02-22 MED ORDER — MIDAZOLAM HCL 2 MG/2ML IJ SOLN
INTRAMUSCULAR | Status: AC
  Filled 2018-02-22: qty 2

## 2018-02-22 MED ORDER — SODIUM CHLORIDE 0.9 % IV SOLN: 250.0000 mL | INTRAVENOUS | Status: DC | PRN

## 2018-02-22 MED ORDER — HEPARIN (PORCINE) IN NACL 1000-0.9 UT/500ML-% IV SOLN
INTRAVENOUS | Status: DC | PRN
  Administered 2018-02-22 (×2): 500 mL

## 2018-02-22 MED ORDER — VERAPAMIL HCL 2.5 MG/ML IV SOLN
INTRAVENOUS | Status: AC
  Filled 2018-02-22: qty 2

## 2018-02-22 MED ORDER — HEPARIN SODIUM (PORCINE) 1000 UNIT/ML IJ SOLN
INTRAMUSCULAR | Status: DC | PRN
  Administered 2018-02-22: 7000 [IU] via INTRAVENOUS

## 2018-02-22 MED ORDER — VERAPAMIL HCL 2.5 MG/ML IV SOLN
INTRAVENOUS | Status: DC | PRN
  Administered 2018-02-22: 10 mL via INTRA_ARTERIAL

## 2018-02-22 MED ORDER — ACETAMINOPHEN 325 MG PO TABS: 650.0000 mg | ORAL_TABLET | ORAL | Status: DC | PRN

## 2018-02-22 MED ORDER — FENTANYL CITRATE (PF) 100 MCG/2ML IJ SOLN
INTRAMUSCULAR | Status: AC
  Filled 2018-02-22: qty 2

## 2018-02-22 MED ORDER — SODIUM CHLORIDE 0.9 % IV SOLN
INTRAVENOUS | Status: AC
  Administered 2018-02-22: 08:00:00 via INTRAVENOUS

## 2018-02-22 MED ORDER — ASPIRIN 81 MG PO CHEW: 81.0000 mg | CHEWABLE_TABLET | ORAL | Status: DC

## 2018-02-22 MED ORDER — ONDANSETRON HCL 4 MG/2ML IJ SOLN: 4.0000 mg | Freq: Four times a day (QID) | INTRAMUSCULAR | Status: DC | PRN

## 2018-02-22 MED ORDER — MIDAZOLAM HCL 2 MG/2ML IJ SOLN
INTRAMUSCULAR | Status: DC | PRN
  Administered 2018-02-22: 2 mg via INTRAVENOUS

## 2018-02-22 SURGICAL SUPPLY — 11 items
CATH 5FR JL3.5 JR4 ANG PIG MP (CATHETERS) ×2 IMPLANT
DEVICE RAD COMP TR BAND LRG (VASCULAR PRODUCTS) ×2 IMPLANT
GLIDESHEATH SLEND SS 6F .021 (SHEATH) ×2 IMPLANT
GUIDEWIRE INQWIRE 1.5J.035X260 (WIRE) ×1 IMPLANT
HOVERMATT SINGLE USE (MISCELLANEOUS) ×2 IMPLANT
INQWIRE 1.5J .035X260CM (WIRE) ×2
KIT HEART LEFT (KITS) ×2 IMPLANT
PACK CARDIAC CATHETERIZATION (CUSTOM PROCEDURE TRAY) ×2 IMPLANT
SYR MEDRAD MARK V 150ML (SYRINGE) ×2 IMPLANT
TRANSDUCER W/STOPCOCK (MISCELLANEOUS) ×2 IMPLANT
TUBING CIL FLEX 10 FLL-RA (TUBING) ×2 IMPLANT

## 2018-02-22 NOTE — Interval H&P Note (Signed)
Cath Lab Visit (complete for each Cath Lab visit)  Clinical Evaluation Leading to the Procedure:   ACS: No.  Non-ACS:    Anginal Classification: CCS II  Anti-ischemic medical therapy: Minimal Therapy (1 class of medications)  Non-Invasive Test Results: Intermediate-risk stress test findings: cardiac mortality 1-3%/year  Prior CABG: No previous CABG      History and Physical Interval Note:  02/22/2018 8:23 AM  Garrett Welch  has presented today for surgery, with the diagnosis of as  The various methods of treatment have been discussed with the patient and family. After consideration of risks, benefits and other options for treatment, the patient has consented to  Procedure(s): LEFT HEART CATH AND CORONARY ANGIOGRAPHY (N/A) as a surgical intervention .  The patient's history has been reviewed, patient examined, no change in status, stable for surgery.  I have reviewed the patient's chart and labs.  Questions were answered to the patient's satisfaction.     Sherren Mocha

## 2018-02-22 NOTE — Discharge Instructions (Signed)
NO METFORMIN/GLUCOPHAGE FOR 2 DAYS ° ° ° °Radial Site Care °Refer to this sheet in the next few weeks. These instructions provide you with information about caring for yourself after your procedure. Your health care provider may also give you more specific instructions. Your treatment has been planned according to current medical practices, but problems sometimes occur. Call your health care provider if you have any problems or questions after your procedure. °What can I expect after the procedure? °After your procedure, it is typical to have the following: °· Bruising at the radial site that usually fades within 1-2 weeks. °· Blood collecting in the tissue (hematoma) that may be painful to the touch. It should usually decrease in size and tenderness within 1-2 weeks. ° °Follow these instructions at home: °· Take medicines only as directed by your health care provider. °· You may shower 24-48 hours after the procedure or as directed by your health care provider. Remove the bandage (dressing) and gently wash the site with plain soap and water. Pat the area dry with a clean towel. Do not rub the site, because this may cause bleeding. °· Do not take baths, swim, or use a hot tub until your health care provider approves. °· Check your insertion site every day for redness, swelling, or drainage. °· Do not apply powder or lotion to the site. °· Do not flex or bend the affected arm for 24 hours or as directed by your health care provider. °· Do not push or pull heavy objects with the affected arm for 24 hours or as directed by your health care provider. °· Do not lift over 10 lb (4.5 kg) for 5 days after your procedure or as directed by your health care provider. °· Ask your health care provider when it is okay to: °? Return to work or school. °? Resume usual physical activities or sports. °? Resume sexual activity. °· Do not drive home if you are discharged the same day as the procedure. Have someone else drive  you. °· You may drive 24 hours after the procedure unless otherwise instructed by your health care provider. °· Do not operate machinery or power tools for 24 hours after the procedure. °· If your procedure was done as an outpatient procedure, which means that you went home the same day as your procedure, a responsible adult should be with you for the first 24 hours after you arrive home. °· Keep all follow-up visits as directed by your health care provider. This is important. °Contact a health care provider if: °· You have a fever. °· You have chills. °· You have increased bleeding from the radial site. Hold pressure on the site. °Get help right away if: °· You have unusual pain at the radial site. °· You have redness, warmth, or swelling at the radial site. °· You have drainage (other than a small amount of blood on the dressing) from the radial site. °· The radial site is bleeding, and the bleeding does not stop after 30 minutes of holding steady pressure on the site. °· Your arm or hand becomes pale, cool, tingly, or numb. °This information is not intended to replace advice given to you by your health care provider. Make sure you discuss any questions you have with your health care provider. °Document Released: 05/15/2010 Document Revised: 09/18/2015 Document Reviewed: 10/29/2013 °Elsevier Interactive Patient Education © 2018 [employer] °Moderate Conscious Sedation, Adult, Care After °These instructions provide you with information about caring for yourself after your   procedure. Your health care provider may also give you more specific instructions. Your treatment has been planned according to current medical practices, but problems sometimes occur. Call your health care provider if you have any problems or questions after your procedure. °What can I expect after the procedure? °After your procedure, it is common: °· To feel sleepy for several hours. °· To feel clumsy and have poor balance for several  hours. °· To have poor judgment for several hours. °· To vomit if you eat too soon. ° °Follow these instructions at home: °For at least 24 hours after the procedure: ° °· Do not: °? Participate in activities where you could fall or become injured. °? Drive. °? Use heavy machinery. °? Drink alcohol. °? Take sleeping pills or medicines that cause drowsiness. °? Make important decisions or sign legal documents. °? Take care of children on your own. °· Rest. °Eating and drinking °· Follow the diet recommended by your health care provider. °· If you vomit: °? Drink water, juice, or soup when you can drink without vomiting. °? Make sure you have little or no nausea before eating solid foods. °General instructions °· Have a responsible adult stay with you until you are awake and alert. °· Take over-the-counter and prescription medicines only as told by your health care provider. °· If you smoke, do not smoke without supervision. °· Keep all follow-up visits as told by your health care provider. This is important. °Contact a health care provider if: °· You keep feeling nauseous or you keep vomiting. °· You feel light-headed. °· You develop a rash. °· You have a fever. °Get help right away if: °· You have trouble breathing. °This information is not intended to replace advice given to you by your health care provider. Make sure you discuss any questions you have with your health care provider. °Document Released: 01/31/2013 Document Revised: 09/15/2015 Document Reviewed: 08/02/2015 °Elsevier Interactive Patient Education © 2018 [employer] ° °

## 2018-03-09 ENCOUNTER — Institutional Professional Consult (permissible substitution) (INDEPENDENT_AMBULATORY_CARE_PROVIDER_SITE_OTHER): Payer: BLUE CROSS/BLUE SHIELD | Admitting: Surgery

## 2018-03-09 ENCOUNTER — Encounter: Payer: Self-pay | Admitting: Surgery

## 2018-03-09 VITALS — BP 133/70 | HR 48 | Resp 20 | Ht 75.0 in | Wt 330.0 lb

## 2018-03-09 DIAGNOSIS — I2510 Atherosclerotic heart disease of native coronary artery without angina pectoris: Secondary | ICD-10-CM | POA: Diagnosis not present

## 2018-03-09 NOTE — Progress Notes (Signed)
Cardiothoracic Surgery Consultation   PCP is Tempie Hoist, MD Referring Provider is Sherren Mocha, MD  Chief Complaint  Patient presents with  . 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  His left ventricular function was normal and so he developed atrial flutter with rapid ventricular response and was shown to have an ejection fraction of 30%.  He underwent atrial flutter ablation with restoration of sinus rhythm and his ejection fraction increased to 50 to 55% by echocardiogram on 12/16/2017.  He underwent an exercise stress test on 01/09/2018 which showed an ejection fraction of 41%.  There were no ST segment changes during stress.  There is a medium size defect of moderate severity in the apical anterior, apical septal, apical inferior, apical lateral, and apical location findings consistent with ischemia.  He was seen in follow-up by Dr. Burt Knack on 02/10/2018.  He underwent cardiac catheterization on 02/22/2018 which showed 40 to 50% distal left main stenosis.  There is chronic total occlusion of the proximal LAD with right to left collaterals supplying the mid and distal LAD.  There is subtotal occlusion of the first diagonal branch.  There is moderate stenosis in the large ramus branch of 60%.  The right coronary was a large dominant vessel with mild nonobstructive disease.  Left ventricular ejection fraction was 50 to 55%.  He is here with his wife today. He works as a  Transport planner. The patient denies any chest pain or pressure.  He has had no pain in his neck or jaw or arms.  He has had exertional fatigue and tiredness.  He denies any shortness of breath with exertion.  He has not been very active since his ablation.  He has a history of lower extremity venous stasis ulcers and is undergone bilateral saphenous vein ablation with laser in the past in [place].   Past Medical History:  Diagnosis Date  . Atrial flutter with rapid ventricular response ([place]) 09/16/2017  . Cancer ([place])   . Diabetes mellitus without complication ([place])   . HTN (hypertension)   . Hyperlipidemia LDL goal <70   . OA (osteoarthritis)   . PAC (premature atrial contraction)   . Prostate cancer ([place])   . Sleep apnea     Past Surgical History:  Procedure Laterality Date  . A-FLUTTER ABLATION N/A 10/18/2017   Procedure: A-FLUTTER ABLATION;  Surgeon: Evans Lance, MD;  Location: [facility];  Service: Cardiovascular;  Laterality: N/A;  . CARDIAC CATHETERIZATION  09/2014   [facility] in [place], LAD 100%, anomalous CFX 40-50%, RCA patent  . GASTRIC BYPASS    . LEFT HEART CATH AND CORONARY ANGIOGRAPHY N/A 02/22/2018   Procedure: LEFT HEART CATH AND CORONARY ANGIOGRAPHY;  Surgeon: Sherren Mocha, MD;  Location: [facility];  Service: Cardiovascular;  Laterality: N/A;    Family History  Problem Relation Age of Onset  . Heart failure Mother   . Heart disease Mother   . Heart attack Mother   . Hypertension Mother   . Stroke Father   .  Kidney failure Father   . Cancer Father   . Hypertension Maternal Grandmother     Social History Social History   Tobacco Use  . Smoking status: Never Smoker  . Smokeless tobacco: Never Used  Substance Use Topics  . Alcohol use: Yes    Comment: occ  . Drug use: Not on file    Current Outpatient Medications  Medication Sig Dispense Refill  . acetaminophen (TYLENOL) 500 MG tablet Take 1,000 mg by mouth every 8 (eight) hours as  needed (pain).    Marland Kitchen albuterol (PROVENTIL HFA;VENTOLIN HFA) 108 (90 Base) MCG/ACT inhaler Inhale 2 puffs into the lungs every 6 (six) hours as needed for wheezing or shortness of breath.    Marland Kitchen aspirin EC 81 MG tablet Take 81 mg by mouth at bedtime.     . ferrous sulfate 325 (65 FE) MG EC tablet Take 325-650 mg by mouth 2 (two) times daily. Take 2 tablets (650 mg) by mouth in the morning & 1 tablet (325 mg) by mouth at night.    . fluticasone (FLONASE) 50 MCG/ACT nasal spray Place 2 sprays into both nostrils daily as needed for allergies.     . hydrochlorothiazide (HYDRODIURIL) 25 MG tablet TAKE 1 TABLET (25 MG TOTAL) BY MOUTH DAILY. PLEASE KEEP UPCOMING APPT FOR FUTURE REFILLS. THANKS 90 tablet 2  . irbesartan (AVAPRO) 75 MG tablet Take 1 tablet (75 mg total) by mouth daily. 90 tablet 3  . isosorbide mononitrate (IMDUR) 30 MG 24 hr tablet TAKE ONE TABLET (30 MG TOTAL) BY MOUTH DAILY. (Patient taking differently: Take 30 mg by mouth daily. ) 30 tablet 10  . metFORMIN (GLUCOPHAGE) 500 MG tablet Take 1,000 mg by mouth 2 (two) times daily. Take 1000 mg in the morning and 500 mg in the evening  9  . metoprolol tartrate (LOPRESSOR) 50 MG tablet Take 0.5 tablets (25 mg total) by mouth 2 (two) times daily. 60 tablet 6  . Multiple Vitamins-Minerals (MULTIVITAMIN WITH MINERALS) tablet Take 1 tablet by mouth daily.    . naproxen sodium (ANAPROX) 220 MG tablet Take 440 mg by mouth daily as needed (pain).     . Omega-3 Fatty Acids (FISH OIL) 1000 MG CAPS Take 1,000 mg by mouth daily.    Vladimir Faster Glycol-Propyl Glycol (LUBRICANT EYE DROPS) 0.4-0.3 % SOLN Place 1 drop into both eyes 3 (three) times daily as needed (for dry/irritated eyes.).    Marland Kitchen pramipexole (MIRAPEX) 0.125 MG tablet Take 0.125 mg by mouth at bedtime.  2  . rosuvastatin (CRESTOR) 10 MG tablet Take 10 mg by mouth every evening.    . sodium fluoride (PREVIDENT 5000 PLUS) 1.1 % CREA dental cream Take 1 application by mouth 2 (two) times daily.     .  tamsulosin (FLOMAX) 0.4 MG CAPS capsule Take 0.4 mg by mouth at bedtime.   3   No current facility-administered medications for this visit.     Allergies  Allergen Reactions  . Tape Rash    With prolonged use--blisters    Review of Systems  Constitutional: Positive for activity change and fatigue.  HENT: Positive for hearing loss.   Eyes: Negative.   Respiratory: Negative for chest tightness and shortness of breath.        Sleep apnea and uses CPAP at night.  Cardiovascular: Positive for leg swelling. Negative for chest pain and palpitations.  Gastrointestinal: Negative.   Genitourinary:       Had prostate radioactive seed implant 12 yrs ago.  Musculoskeletal:  Positive for arthralgias.  Skin: Negative.   Allergic/Immunologic: Negative.   Neurological: Negative.   Hematological: Negative.   Psychiatric/Behavioral: Negative.     BP 133/70   Pulse (!) 48   Resp 20   Ht 6\' 3"  (1.905 m)   Wt (!) 330 lb (149.7 kg)   SpO2 98% Comment: RA  BMI 41.25 kg/m  Physical Exam  Constitutional: He is oriented to person, place, and time. No distress.  Morbidly obese gentleman with BMI 41.3.  HENT:  Head: Normocephalic and atraumatic.  Mouth/Throat: Oropharynx is clear and moist.  Eyes: Pupils are equal, round, and reactive to light. EOM are normal.  Neck: Normal range of motion. Neck supple. No JVD present.  Cardiovascular: Normal rate, regular rhythm and normal heart sounds.  No murmur heard. Pulmonary/Chest: Effort normal and breath sounds normal. No respiratory distress.  Abdominal: He exhibits no distension. There is no tenderness.  Musculoskeletal: He exhibits edema.  Neurological: He is alert and oriented to person, place, and time.  Skin: Skin is warm and dry.  Psychiatric: He has a normal mood and affect.     Diagnostic Tests:  [facility]                        [address], [place] [address]                             [phone]  ------------------------------------------------------------------- Echocardiography  Patient:    Quincy, Boy MR #:       [id number] Study Date: 12/16/2017 Gender:     M Age:        69 Height:     190.5 cm Weight:     150.3 kg BSA:        2.88 m^2 Pt. Status: Room:   SONOGRAPHER  Victorio Palm, RDCS  ATTENDING    Sherren Mocha, MD  ORDERING     Sherren Mocha, MD  REFERRING    Sherren Mocha, MD  PERFORMING   [facility], Outpatient  cc:  ------------------------------------------------------------------- LV EF: 50% -   55%  ------------------------------------------------------------------- Indications:      (I25.10).  (I48.3).  ------------------------------------------------------------------- History:   PMH:  Acquired from the patient and from the patient&'s chart.  Atrial flutter.  Coronary artery disease.  Risk factors: Hypertension. Diabetes mellitus. Morbidly obese. Dyslipidemia.  ------------------------------------------------------------------- Study Conclusions  - Left ventricle: The cavity size was normal. There was moderate   concentric hypertrophy. Systolic function was normal. The   estimated ejection fraction was in the range of 50% to 55%.   Doppler parameters are consistent with abnormal left ventricular   relaxation (grade 1 diastolic dysfunction). Doppler parameters   are consistent with elevated ventricular end-diastolic filling   pressure. - Aortic valve: There was no regurgitation. - Ascending aorta: The ascending aorta was mildly dilated measuring   40 mm. - Mitral valve: Calcified annulus. Mildly thickened leaflets . - Right ventricle: The cavity size was normal. Wall thickness was   normal. Systolic pressure was within the normal range. - Tricuspid valve: There was trivial regurgitation. - Pulmonary arteries: Systolic pressure was within the normal   range. - Inferior vena cava: The vessel was normal in size.  The   respirophasic diameter changes were in the normal range (>=  50%),   consistent with normal central venous pressure.  Impressions:  - Since the last study on 09/28/2017 LVEF has improved from 30-35% to   50-55%.  ------------------------------------------------------------------- Labs, prior tests, procedures, and surgery: Echocardiography (June 2019).     EF was 30%.  Catheterization (2016).    The study demonstrated coronary artery disease. There was an occlusion in the left anterior descending coronary artery with collaterals. Electrophysiology study with ablation.     Atrial flutter radiofrequency ablation.  ------------------------------------------------------------------- Study data:  Comparison was made to the study of June 2019.  Study status:  Routine.  Procedure:  The patient reported no pain pre or post test. Transthoracic echocardiography for left ventricular function evaluation, for right ventricular function evaluation, and for assessment of valvular function. Image quality was adequate. The study was technically difficult, as a result of poor sound wave transmission. Intravenous contrast (Definity) was administered to opacify the LV.  Study completion:  There were no complications.       Echocardiography.  M-mode, complete 2D, spectral Doppler, and color Doppler.  Birthdate:  Patient birthdate: 1950/05/21.  Age: Patient is 67 yr old.  Sex:  Gender: male.    BMI: 41.4 kg/m^2. Blood pressure:     138/70  Patient status:  Outpatient.  Study date:  Study date: 12/16/2017. Study time: 09:35 AM.  Location: [facility]  -------------------------------------------------------------------  ------------------------------------------------------------------- Left ventricle:  The cavity size was normal. There was moderate concentric hypertrophy. Systolic function was normal. The estimated ejection fraction was in the range of 50% to 55%. Doppler parameters  are consistent with abnormal left ventricular relaxation (grade 1 diastolic dysfunction). Doppler parameters are consistent with elevated ventricular end-diastolic filling pressure.  ------------------------------------------------------------------- Aortic valve:   Trileaflet; moderately thickened, moderately calcified leaflets. Sclerosis without stenosis.  Doppler:  There was no regurgitation.  ------------------------------------------------------------------- Aorta:  Ascending aorta: The ascending aorta was mildly dilated measuring 40 mm.  ------------------------------------------------------------------- Mitral valve:   Calcified annulus. Mildly thickened leaflets . Doppler:     Peak gradient (D): 4 mm Hg.  ------------------------------------------------------------------- Right ventricle:  The cavity size was normal. Wall thickness was normal. Systolic function was normal. Systolic pressure was within the normal range.  ------------------------------------------------------------------- Pulmonic valve:    Structurally normal valve.   Cusp separation was normal.  Doppler:  Transvalvular velocity was within the normal range. There was no regurgitation.  ------------------------------------------------------------------- Tricuspid valve:   Structurally normal valve.   Leaflet separation was normal.  Doppler:  Transvalvular velocity was within the normal range. There was trivial regurgitation.  ------------------------------------------------------------------- Pulmonary artery:   Systolic pressure was within the normal range.   ------------------------------------------------------------------- Systemic veins: Inferior vena cava: The vessel was normal in size. The respirophasic diameter changes were in the normal range (>= 50%), consistent with normal central venous pressure.  ------------------------------------------------------------------- Measurements    Left ventricle                          Value        Reference  LV ID, ED, PLAX chordal                 52    mm     43 - 52  LV ID, ES, PLAX chordal                 35    mm     23 - 38  LV fx shortening, PLAX chordal  33    %      >=29  LV PW thickness, ED                     15    mm     ---------  IVS/LV PW ratio, ED                     0.87         <=1.3  Stroke volume, 2D                       170   ml     ---------  Stroke volume/bsa, 2D                   59    ml/m^2 ---------  LV e&', lateral                          9.87  cm/s   ---------  LV E/e&', lateral                        9.48         ---------  LV e&', medial                           5.18  cm/s   ---------  LV E/e&', medial                         18.07        ---------  LV e&', average                          7.53  cm/s   ---------  LV E/e&', average                        12.44        ---------    Ventricular septum                      Value        Reference  IVS thickness, ED                       13    mm     ---------    LVOT                                    Value        Reference  LVOT ID, S                              31    mm     ---------  LVOT area                               7.55  cm^2   ---------  LVOT ID                                 31  mm     ---------  LVOT peak velocity, S                   90.4  cm/s   ---------  LVOT mean velocity, S                   65.2  cm/s   ---------  LVOT VTI, S                             22.5  cm     ---------  Stroke volume (SV), LVOT DP             169.8 ml     ---------  Stroke index (SV/bsa), LVOT DP          58.9  ml/m^2 ---------    Aorta                                   Value        Reference  Aortic root ID, ED                      40    mm     ---------  Ascending aorta ID, A-P, S              40    mm     ---------    Left atrium                             Value        Reference  LA ID, A-P, ES                          44    mm      ---------  LA ID/bsa, A-P                          1.53  cm/m^2 <=2.2  LA volume, S                            48.1  ml     ---------  LA volume/bsa, S                        16.7  ml/m^2 ---------  LA volume, ES, 1-p A4C                  41.8  ml     ---------  LA volume/bsa, ES, 1-p A4C              14.5  ml/m^2 ---------  LA volume, ES, 1-p A2C                  54.3  ml     ---------  LA volume/bsa, ES, 1-p A2C              18.8  ml/m^2 ---------    Mitral valve                            Value        Reference  Mitral E-wave peak velocity  93.6  cm/s   ---------  Mitral A-wave peak velocity             105   cm/s   ---------  Mitral deceleration time        (H)     401   ms     150 - 230  Mitral peak gradient, D                 4     mm Hg  ---------  Mitral E/A ratio, peak                  0.8          ---------    Right atrium                            Value        Reference  RA ID, S-I, ES                          52    mm     ---------  RA ID, M-L, ES, A4C             (H)     49    mm     30 - 46    Right ventricle                         Value        Reference  RV ID, minor axis, ED, A4C base         49    mm     ---------  TAPSE                                   27.2  mm     ---------  RV s&', lateral, S                       11    cm/s   ---------  Legend: (L)  and  (H)  mark values outside specified reference range.  ------------------------------------------------------------------- Prepared and Electronically Authenticated by  Ena Dawley, M.D. 2019-08-23T10:38:02  Physicians   Panel Physicians Referring Physician Case Authorizing Physician  Sherren Mocha, MD (Primary)    Procedures   LEFT HEART CATH AND CORONARY ANGIOGRAPHY  Conclusion     Prox RCA to Mid RCA lesion is 25% stenosed.  Post Atrio lesion is 50% stenosed.  Mid LM to Dist LM lesion is 50% stenosed.  Ost Ramus to Ramus lesion is 60% stenosed.  Ost Cx to Prox Cx  lesion is 99% stenosed.  Ost LAD to Prox LAD lesion is 95% stenosed.  Prox LAD lesion is 100% stenosed.  Ost 1st Diag lesion is 99% stenosed.  The left ventricular ejection fraction is 50-55% by visual estimate.   1.  Mild to moderate distal left main stenosis estimated at 40 to 50% 2.  Chronic total occlusion of the proximal LAD with right to left collaterals supplying the mid and distal LAD territory 3.  Subtotal occlusion of the first diagonal branch in the left circumflex 4.  Moderate stenosis of the large ramus intermedius branch estimated at 60% 5.  Patent RCA with mild nonobstructive stenosis (large dominant vessel) 6.  Mild segmental contraction  abnormality the left ventricle with preserved overall LV systolic function  Recommend: The patient does not have typical symptoms of angina.  However he has moderate ischemia on stress testing and based on assessment of his coronary anatomy as at large burden of potential ischemia involving the LAD, diagonal branch, circumflex, and intermediate branches.  I do not think he has any feasible PCI options.  I am going to refer him for an outpatient cardiac surgical evaluation for consideration of CABG versus ongoing medical therapy.   Indications   Abnormal nuclear cardiac imaging test [R93.1 (ICD-10-CM)]  Procedural Details/Technique   Technical Details INDICATION: 67 year old gentleman with diabetes and morbid obesity who has known occlusion of the proximal LAD. Earlier this year he developed atrial flutter with RVR and marked deterioration in LV systolic function. He underwent flutter ablation and his LVEF has improved, now estimated at 50 to 55% by echo assessment. The patient has never had symptoms of typical angina. An exercise Myoview stress test has been performed and it demonstrates anteroapical ischemia, LVEF 41%, and is interpreted as an intermediate risk study. He is referred for cardiac catheterization to assess revascularization  options.  PROCEDURAL DETAILS: The right wrist was prepped, draped, and anesthetized with 1% lidocaine. Using the modified Seldinger technique, a 5/6 French Slender sheath was introduced into the right radial artery. 3 mg of verapamil was administered through the sheath, weight-based unfractionated heparin was administered intravenously. Standard Judkins catheters were used for selective coronary angiography and left ventriculography. Catheter exchanges were performed over an exchange length guidewire. There were no immediate procedural complications. A TR band was used for radial hemostasis at the completion of the procedure. The patient was transferred to the post catheterization recovery area for further monitoring.     Estimated blood loss <50 mL.  During this procedure the patient was administered the following to achieve and maintain moderate conscious sedation: Versed 2 mg, Fentanyl 25 mcg, while the patient's heart rate, blood pressure, and oxygen saturation were continuously monitored. The period of conscious sedation was 20 minutes, of which I was present face-to-face 100% of this time.  Coronary Findings   Diagnostic  Dominance: Right  Left Main  Mid LM to Dist LM lesion 50% stenosed  Mid LM to Dist LM lesion is 50% stenosed.  Left Anterior Descending  Ost LAD to Prox LAD lesion 95% stenosed  Ost LAD to Prox LAD lesion is 95% stenosed.  Prox LAD lesion 100% stenosed  Prox LAD lesion is 100% stenosed. The LAD is occluded proximally at the origin of the first diagonal. The vessel fills via a well-formed collateral from the distal RCA.  First Diagonal Branch  Ost 1st Diag lesion 99% stenosed  Ost 1st Diag lesion is 99% stenosed. the first diagonal is large in caliber with subtotal proximal occlusion. Fills late.  Second Septal Branch  Collaterals  2nd Sept filled by collaterals from 1st RPLB.    Third Septal Branch  Collaterals  3rd Sept filled by collaterals from [place].      Ramus Intermedius  Ost Ramus to Ramus lesion 60% stenosed  Ost Ramus to Ramus lesion is 60% stenosed. Diffuse 60% stenosis of the ramus intermedius. This branch is large and supplies multiple sub-branches.  Left Circumflex  Ost Cx to Prox Cx lesion 99% stenosed  Ost Cx to Prox Cx lesion is 99% stenosed. small circumflex, subtotal proximal occlusion, fills late  Right Coronary Artery  Prox RCA to Mid RCA lesion 25% stenosed  Prox RCA to Mid  RCA lesion is 25% stenosed.  Right Posterior Atrioventricular Branch  Post Atrio lesion 50% stenosed  Post Atrio lesion is 50% stenosed.  Intervention   No interventions have been documented.  Wall Motion   Resting       The mid anterior, basilar anterior, basilar inferior and apical anterior segments are normal. The mid inferior and apical inferior segments are hypokinetic.           Left Heart   Left Ventricle The left ventricular ejection fraction is 50-55% by visual estimate. There are LV function abnormalities due to segmental dysfunction.  Coronary Diagrams   Diagnostic Diagram       Implants    No implant documentation for this case.  MERGE Images   Show images for CARDIAC CATHETERIZATION   Link to Procedure Log   Procedure Log    Hemo Data    Most Recent Value  AO Systolic Pressure 845 mmHg  AO Diastolic Pressure 64 mmHg  AO Mean 83 mmHg  LV Systolic Pressure 364 mmHg  LV Diastolic Pressure 3 mmHg  LV EDP 12 mmHg  AOp Systolic Pressure 680 mmHg  AOp Diastolic Pressure 59 mmHg  AOp Mean Pressure 84 mmHg  LVp Systolic Pressure 321 mmHg  LVp Diastolic Pressure 1 mmHg  LVp EDP Pressure 12 mmHg     Impression:  This 67 year old gentleman has severe two-vessel coronary disease with a chronically occluded LAD supplied by collaterals from the right coronary artery and a moderate size ramus branch with 60% proximal stenosis.  His nuclear stress test shows a large ischemic burden in the anterior wall and septum that  could be significantly improved with coronary bypass graft surgery.  He has had exertional fatigue.  I think coronary bypass graft surgery is the best treatment to preserve his viable myocardium in the anterior wall and septum and to try to improve his exertional fatigue.  I discussed the option of coronary bypass graft surgery as well as the option of continued medical treatment.  I reviewed the cardiac catheterization films with the patient and his wife and answered their questions.  They would like to proceed with surgical treatment.  He will probably not have any suitable saphenous vein since he had bilateral saphenous vein ablation with laser.  I will plan on using either both left and right internal mammary arteries and possibly a radial artery depending on his upper extremity arterial Dopplers. I discussed the operative procedure with the patient and his wife including alternatives, benefits and risks; including but not limited to bleeding, blood transfusion, infection, stroke, myocardial infarction, graft failure, heart block requiring a permanent pacemaker, organ dysfunction, and death.  Lanora Manis III understands and agrees to proceed.     Plan:  We will schedule coronary bypass graft surgery for Monday, 03/20/2018. He will need upper extremity arterial dopplers preop to decide about the radial arteries. He will stop his fish oil now and will hold his Metformin for 48 hrs preop.   I spent 45 minutes performing this consultation and > 50% of this time was spent face to face counseling and coordinating the care of this patient's severe 2-vessel coronary artery disease.   [name], MD [facility] [phone]

## 2018-03-10 ENCOUNTER — Other Ambulatory Visit: Payer: Self-pay | Admitting: Physician Assistant

## 2018-03-13 ENCOUNTER — Other Ambulatory Visit: Payer: Self-pay | Admitting: *Deleted

## 2018-03-13 DIAGNOSIS — I2510 Atherosclerotic heart disease of native coronary artery without angina pectoris: Secondary | ICD-10-CM

## 2018-03-15 ENCOUNTER — Encounter: Payer: Self-pay | Admitting: *Deleted

## 2018-03-15 NOTE — Pre-Procedure Instructions (Signed)
Lanora Manis III  03/15/2018      [id number] - [place], [place] - [address] [place] [address] Phone: [phone] Fax: [phone]    Your procedure is scheduled on Monday November 25th.  Report to [facility] Admitting at [place].M.  Call this number if you have problems the morning of surgery:  [phone]   Remember:  Do not eat or drink after midnight.    Take these medicines the morning of surgery with A SIP OF WATER   acetaminophen (TYLENOL) if needed  albuterol (PROVENTIL HFA;VENTOLIN HFA) If needed. Bring with you to surgery  fluticasone (FLONASE) if needed  isosorbide mononitrate (IMDUR)  metoprolol tartrate (LOPRESSOR)    7 days prior to surgery STOP taking any Aspirin(unless otherwise instructed by your surgeon), Aleve, Naproxen, Ibuprofen, Motrin, Advil, Goody's, BC's, all herbal medications, fish oil, and all vitamins   WHAT DO I DO ABOUT MY DIABETES MEDICATION?   Marland Kitchen Do not take oral diabetes medicines (pills) the morning of surgery: metFORMIN (GLUCOPHAGE).  . The day of surgery, do not take other diabetes injectables, including Byetta (exenatide), Bydureon (exenatide ER), Victoza (liraglutide), or Trulicity (dulaglutide).  . If your CBG is greater than 220 mg/dL, you may take  of your sliding scale (correction) dose of insulin.   How to Manage Your Diabetes Before and After Surgery  Why is it important to control my blood sugar before and after surgery? . Improving blood sugar levels before and after surgery helps healing and can limit problems. . A way of improving blood sugar control is eating a healthy diet by: o  Eating less sugar and carbohydrates o  Increasing activity/exercise o  Talking with your doctor about reaching your blood sugar goals . High blood sugars (greater than 180 mg/dL) can raise your risk of infections and slow your recovery, so you will need to focus on  controlling your diabetes during the weeks before surgery. . Make sure that the doctor who takes care of your diabetes knows about your planned surgery including the date and location.  How do I manage my blood sugar before surgery? . Check your blood sugar at least 4 times a day, starting 2 days before surgery, to make sure that the level is not too high or low. o Check your blood sugar the morning of your surgery when you wake up and every 2 hours until you get to the Short Stay unit. . If your blood sugar is less than 70 mg/dL, you will need to treat for low blood sugar: o Do not take insulin. o Treat a low blood sugar (less than 70 mg/dL) with  cup of clear juice (cranberry or apple), 4 glucose tablets, OR glucose gel. o Recheck blood sugar in 15 minutes after treatment (to make sure it is greater than 70 mg/dL). If your blood sugar is not greater than 70 mg/dL on recheck, call [phone] for further instructions. . Report your blood sugar to the short stay nurse when you get to Short Stay.  . If you are admitted to the hospital after surgery: o Your blood sugar will be checked by the staff and you will probably be given insulin after surgery (instead of oral diabetes medicines) to make sure you have good blood sugar levels. o The goal for blood sugar control after surgery is 80-180 mg/dL.     Do not wear jewelry.  Do not wear lotions, powders, or colognes, or  deodorant.  Men may shave face and neck.  Do not bring valuables to the hospital.  [facility] is not responsible for any belongings or valuables.  Contacts, dentures or bridgework may not be worn into surgery.  Leave your suitcase in the car.  After surgery it may be brought to your room.  For patients admitted to the hospital, discharge time will be determined by your treatment team.  Patients discharged the day of surgery will not be allowed to drive home.    [facility]- Preparing For Surgery  Before surgery, you can  play an important role. Because skin is not sterile, your skin needs to be as free of germs as possible. You can reduce the number of germs on your skin by washing with CHG (chlorahexidine gluconate) Soap before surgery.  CHG is an antiseptic cleaner which kills germs and bonds with the skin to continue killing germs even after washing.    Oral Hygiene is also important to reduce your risk of infection.  Remember - BRUSH YOUR TEETH THE MORNING OF SURGERY WITH YOUR REGULAR TOOTHPASTE  Please do not use if you have an allergy to CHG or antibacterial soaps. If your skin becomes reddened/irritated stop using the CHG.  Do not shave (including legs and underarms) for at least 48 hours prior to first CHG shower. It is OK to shave your face.  Please follow these instructions carefully.   1. Shower the NIGHT BEFORE SURGERY and the MORNING OF SURGERY with CHG.   2. If you chose to wash your hair, wash your hair first as usual with your normal shampoo.  3. After you shampoo, rinse your hair and body thoroughly to remove the shampoo.  4. Use CHG as you would any other liquid soap. You can apply CHG directly to the skin and wash gently with a scrungie or a clean washcloth.   5. Apply the CHG Soap to your body ONLY FROM THE NECK DOWN.  Do not use on open wounds or open sores. Avoid contact with your eyes, ears, mouth and genitals (private parts). Wash Face and genitals (private parts)  with your normal soap.  6. Wash thoroughly, paying special attention to the area where your surgery will be performed.  7. Thoroughly rinse your body with warm water from the neck down.  8. DO NOT shower/wash with your normal soap after using and rinsing off the CHG Soap.  9. Pat yourself dry with a CLEAN TOWEL.  10. Wear CLEAN PAJAMAS to bed the night before surgery, wear comfortable clothes the morning of surgery  11. Place CLEAN SHEETS on your bed the night of your first shower and DO NOT SLEEP WITH PETS.    Day  of Surgery:  Do not apply any deodorants/lotions.  Please wear clean clothes to the hospital/surgery center.   Remember to brush your teeth WITH YOUR REGULAR TOOTHPASTE.    Please read over the following fact sheets that you were given.

## 2018-03-15 NOTE — Pre-Procedure Instructions (Signed)
Garrett Welch  03/15/2018      [id number] - [place], [place] - [address] [place] [address] Phone: [phone] Fax: [phone]    Your procedure is scheduled on Monday November 25th.  Report to [facility] Admitting at [place].M.  Call this number if you have problems the morning of surgery:  [phone]   Remember:  Do not eat or drink after midnight.    Take these medicines the morning of surgery with A SIP OF WATER   acetaminophen (TYLENOL) if needed  albuterol (PROVENTIL HFA;VENTOLIN HFA) If needed. Bring with you to surgery  fluticasone (FLONASE) if needed  isosorbide mononitrate (IMDUR)  metoprolol tartrate (LOPRESSOR)    7 days prior to surgery STOP taking  Aleve, Naproxen, Ibuprofen, Motrin, Advil, Goody's, BC's, all herbal medications, fish oil, and all vitamins   WHAT DO I DO ABOUT MY DIABETES MEDICATION?   Marland Kitchen Do not take oral diabetes medicines (pills) the morning of surgery: metFORMIN (GLUCOPHAGE).     How to Manage Your Diabetes Before and After Surgery  Why is it important to control my blood sugar before and after surgery? . Improving blood sugar levels before and after surgery helps healing and can limit problems. . A way of improving blood sugar control is eating a healthy diet by: o  Eating less sugar and carbohydrates o  Increasing activity/exercise o  Talking with your doctor about reaching your blood sugar goals . High blood sugars (greater than 180 mg/dL) can raise your risk of infections and slow your recovery, so you will need to focus on controlling your diabetes during the weeks before surgery. . Make sure that the doctor who takes care of your diabetes knows about your planned surgery including the date and location.  How do I manage my blood sugar before surgery? . Check your blood sugar at least 4 times a day, starting 2 days before surgery, to make sure that the  level is not too high or low. o Check your blood sugar the morning of your surgery when you wake up and every 2 hours until you get to the Short Stay unit. . If your blood sugar is less than 70 mg/dL, you will need to treat for low blood sugar: o Do not take insulin. o Treat a low blood sugar (less than 70 mg/dL) with  cup of clear juice (cranberry or apple), 4 glucose tablets, OR glucose gel. o Recheck blood sugar in 15 minutes after treatment (to make sure it is greater than 70 mg/dL). If your blood sugar is not greater than 70 mg/dL on recheck, call [phone] for further instructions. . Report your blood sugar to the short stay nurse when you get to Short Stay.  . If you are admitted to the hospital after surgery: o Your blood sugar will be checked by the staff and you will probably be given insulin after surgery (instead of oral diabetes medicines) to make sure you have good blood sugar levels. o The goal for blood sugar control after surgery is 80-180 mg/dL.     Do not wear jewelry.  Do not wear lotions, powders, or colognes, or deodorant.  Men may shave face and neck.  Do not bring valuables to the hospital.  [facility] is not responsible for any belongings or valuables.  Contacts, dentures or bridgework may not be worn into surgery.  Leave your suitcase in the car.  After surgery it  may be brought to your room.  For patients admitted to the hospital, discharge time will be determined by your treatment team.  Patients discharged the day of surgery will not be allowed to drive home.    [facility]- Preparing For Surgery  Before surgery, you can play an important role. Because skin is not sterile, your skin needs to be as free of germs as possible. You can reduce the number of germs on your skin by washing with CHG (chlorahexidine gluconate) Soap before surgery.  CHG is an antiseptic cleaner which kills germs and bonds with the skin to continue killing germs even after washing.     Oral Hygiene is also important to reduce your risk of infection.  Remember - BRUSH YOUR TEETH THE MORNING OF SURGERY WITH YOUR REGULAR TOOTHPASTE  Please do not use if you have an allergy to CHG or antibacterial soaps. If your skin becomes reddened/irritated stop using the CHG.  Do not shave (including legs and underarms) for at least 48 hours prior to first CHG shower. It is OK to shave your face.  Please follow these instructions carefully.   1. Shower the NIGHT BEFORE SURGERY and the MORNING OF SURGERY with CHG.   2. If you chose to wash your hair, wash your hair first as usual with your normal shampoo.  3. After you shampoo, rinse your hair and body thoroughly to remove the shampoo.  4. Use CHG as you would any other liquid soap. You can apply CHG directly to the skin and wash gently with a scrungie or a clean washcloth.   5. Apply the CHG Soap to your body ONLY FROM THE NECK DOWN.  Do not use on open wounds or open sores. Avoid contact with your eyes, ears, mouth and genitals (private parts). Wash Face and genitals (private parts)  with your normal soap.  6. Wash thoroughly, paying special attention to the area where your surgery will be performed.  7. Thoroughly rinse your body with warm water from the neck down.  8. DO NOT shower/wash with your normal soap after using and rinsing off the CHG Soap.  9. Pat yourself dry with a CLEAN TOWEL.  10. Wear CLEAN PAJAMAS to bed the night before surgery, wear comfortable clothes the morning of surgery  11. Place CLEAN SHEETS on your bed the night of your first shower and DO NOT SLEEP WITH PETS.    Day of Surgery:  Do not apply any deodorants/lotions.  Please wear clean clothes to the hospital/surgery center.   Remember to brush your teeth WITH YOUR REGULAR TOOTHPASTE.    Please read over the following fact sheets that you were given.

## 2018-03-16 ENCOUNTER — Ambulatory Visit (HOSPITAL_COMMUNITY)
Admission: RE | Admit: 2018-03-16 | Discharge: 2018-03-16 | Disposition: A | Discharge status: Home / Self Care | Payer: BLUE CROSS/BLUE SHIELD | Source: Ambulatory Visit | Attending: Surgery | Admitting: Surgery

## 2018-03-16 ENCOUNTER — Encounter (HOSPITAL_COMMUNITY)
Admission: RE | Admit: 2018-03-16 | Discharge: 2018-03-16 | Disposition: A | Discharge status: Home / Self Care | Payer: BLUE CROSS/BLUE SHIELD | Source: Ambulatory Visit | Attending: Surgery | Admitting: Surgery

## 2018-03-16 ENCOUNTER — Other Ambulatory Visit: Payer: Self-pay

## 2018-03-16 ENCOUNTER — Encounter (HOSPITAL_COMMUNITY): Payer: Self-pay

## 2018-03-16 DIAGNOSIS — I2510 Atherosclerotic heart disease of native coronary artery without angina pectoris: Secondary | ICD-10-CM

## 2018-03-16 HISTORY — DX: Pneumonia, unspecified organism: J18.9

## 2018-03-16 LAB — PULMONARY FUNCTION TEST
DL/VA % pred: 83 %
DL/VA: 4.06 ml/min/mmHg/L
DLCO UNC: 32.61 ml/min/mmHg
DLCO unc % pred: 83 %
FEF 25-75 POST: 2.87 L/s
FEF 25-75 Pre: 2.62 L/sec
FEF2575-%CHANGE-POST: 9 %
FEF2575-%PRED-POST: 89 %
FEF2575-%PRED-PRE: 81 %
FEV1-%CHANGE-POST: 0 %
FEV1-%PRED-POST: 91 %
FEV1-%PRED-PRE: 91 %
FEV1-POST: 3.73 L
FEV1-Pre: 3.76 L
FEV1FVC-%CHANGE-POST: 1 %
FEV1FVC-%PRED-PRE: 101 %
FEV6-%Change-Post: 0 %
FEV6-%Pred-Post: 92 %
FEV6-%Pred-Pre: 91 %
FEV6-Post: 4.81 L
FEV6-Pre: 4.8 L
FEV6FVC-%Change-Post: 2 %
FEV6FVC-%Pred-Post: 103 %
FEV6FVC-%Pred-Pre: 101 %
FVC-%Change-Post: -2 %
FVC-%PRED-PRE: 90 %
FVC-%Pred-Post: 88 %
FVC-POST: 4.86 L
FVC-PRE: 4.96 L
PRE FEV1/FVC RATIO: 76 %
PRE FEV6/FVC RATIO: 97 %
Post FEV1/FVC ratio: 77 %
Post FEV6/FVC ratio: 99 %

## 2018-03-16 LAB — COMPREHENSIVE METABOLIC PANEL
ALT: 20 U/L (ref 0–44)
AST: 20 U/L (ref 15–41)
Albumin: 3.8 g/dL (ref 3.5–5.0)
Alkaline Phosphatase: 42 U/L (ref 38–126)
Anion gap: 11 (ref 5–15)
BILIRUBIN TOTAL: 0.8 mg/dL (ref 0.3–1.2)
BUN: 14 mg/dL (ref 8–23)
CALCIUM: 9.2 mg/dL (ref 8.9–10.3)
CHLORIDE: 108 mmol/L (ref 98–111)
CO2: 18 mmol/L — ABNORMAL LOW (ref 22–32)
CREATININE: 1.08 mg/dL (ref 0.61–1.24)
GFR calc non Af Amer: >60 mL/min (ref >60)
Glucose, Bld: 125 mg/dL — ABNORMAL HIGH (ref 70–99)
Potassium: 4.4 mmol/L (ref 3.5–5.1)
Sodium: 137 mmol/L (ref 135–145)
Total Protein: 6.2 g/dL — ABNORMAL LOW (ref 6.5–8.1)

## 2018-03-16 LAB — SURGICAL PCR SCREEN
MRSA, PCR: NEGATIVE
STAPHYLOCOCCUS AUREUS: POSITIVE — AB

## 2018-03-16 LAB — URINALYSIS, ROUTINE W REFLEX MICROSCOPIC
Bilirubin Urine: NEGATIVE
Glucose, UA: NEGATIVE mg/dL
HGB URINE DIPSTICK: NEGATIVE
Ketones, ur: NEGATIVE mg/dL
Leukocytes, UA: NEGATIVE
NITRITE: NEGATIVE
PROTEIN: NEGATIVE mg/dL
Specific Gravity, Urine: 1.021 (ref 1.005–1.030)
pH: 5 (ref 5.0–8.0)

## 2018-03-16 LAB — CBC
HEMATOCRIT: 46.5 % (ref 39.0–52.0)
Hemoglobin: 14.1 g/dL (ref 13.0–17.0)
MCH: 30 pg (ref 26.0–34.0)
MCHC: 30.3 g/dL (ref 30.0–36.0)
MCV: 98.9 fL (ref 80.0–100.0)
NRBC: 0 % (ref 0.0–0.2)
PLATELETS: 208 10*3/uL (ref 150–400)
RBC: 4.7 MIL/uL (ref 4.22–5.81)
RDW: 12.4 % (ref 11.5–15.5)
WBC: 8.7 10*3/uL (ref 4.0–10.5)

## 2018-03-16 LAB — GLUCOSE, CAPILLARY: Glucose-Capillary: 157 mg/dL — ABNORMAL HIGH (ref 70–99)

## 2018-03-16 LAB — ABO/RH: ABO/RH(D): O POS

## 2018-03-16 LAB — PROTIME-INR
INR: 1.07
Prothrombin Time: 13.8 seconds (ref 11.4–15.2)

## 2018-03-16 LAB — HEMOGLOBIN A1C
HEMOGLOBIN A1C: 6.4 % — AB (ref 4.8–5.6)
MEAN PLASMA GLUCOSE: 136.98 mg/dL

## 2018-03-16 LAB — APTT: aPTT: 30 seconds (ref 24–36)

## 2018-03-16 MED ORDER — ALBUTEROL SULFATE (2.5 MG/3ML) 0.083% IN NEBU
2.5000 mg | INHALATION_SOLUTION | Freq: Once | RESPIRATORY_TRACT | Status: AC
  Administered 2018-03-16: 2.5 mg via RESPIRATORY_TRACT

## 2018-03-16 NOTE — Progress Notes (Signed)
Carotid artery duplex has been completed. 1-39% ICA stenosis bilaterally.  Palmar waveforms Right Palmar waveforms are obliterated with radial and ulnar compression. Left Palmar waveforms are obliterated with radial and ulnar compression.  ABI's  Right 1.27 Left 1.27  03/16/18 11:25 AM Garrett Welch RVT

## 2018-03-16 NOTE — Progress Notes (Addendum)
PCP: Dr. Tempie Hoist @  [facility] in [place], [place]  Cardiologist: Dr. Burt Knack  Last sleep study > 5 yrs.  Fasting blood sugars 100. Pt. Stated is was instructed to stop metformin Saturday, 11/23 per Dr. Cyndia Bent.  [facility] [place].

## 2018-03-17 LAB — BLOOD GAS, ARTERIAL
Acid-base deficit: 0.6 mmol/L (ref 0.0–2.0)
Bicarbonate: 22.8 mmol/L (ref 20.0–28.0)
DRAWN BY: 470591
FIO2: 0.21
O2 SAT: 98.1 %
PCO2 ART: 33.2 mmHg (ref 32.0–48.0)
PH ART: 7.452 — AB (ref 7.350–7.450)
Patient temperature: 98.6
pO2, Arterial: 108 mmHg (ref 83.0–108.0)

## 2018-03-18 MED ORDER — TRANEXAMIC ACID (OHS) BOLUS VIA INFUSION
15.0000 mg/kg | INTRAVENOUS | Status: AC
  Administered 2018-03-20: 2287.5 mg via INTRAVENOUS
  Filled 2018-03-18: qty 2288

## 2018-03-18 MED ORDER — SODIUM CHLORIDE 0.9 % IV SOLN
1.5000 g | INTRAVENOUS | Status: AC
  Administered 2018-03-20: 1.5 g via INTRAVENOUS
  Filled 2018-03-18 (×2): qty 1.5

## 2018-03-18 MED ORDER — SODIUM CHLORIDE 0.9 % IV SOLN
INTRAVENOUS | Status: DC
  Filled 2018-03-18 (×2): qty 30

## 2018-03-18 MED ORDER — MILRINONE LACTATE IN DEXTROSE 20-5 MG/100ML-% IV SOLN
0.3000 ug/kg/min | INTRAVENOUS | Status: DC
  Filled 2018-03-18 (×2): qty 100

## 2018-03-18 MED ORDER — NITROGLYCERIN IN D5W 200-5 MCG/ML-% IV SOLN
2.0000 ug/min | INTRAVENOUS | Status: AC
  Administered 2018-03-20: 10 ug/min via INTRAVENOUS
  Filled 2018-03-18 (×2): qty 250

## 2018-03-18 MED ORDER — POTASSIUM CHLORIDE 2 MEQ/ML IV SOLN
80.0000 meq | INTRAVENOUS | Status: DC
  Filled 2018-03-18 (×2): qty 40

## 2018-03-18 MED ORDER — INSULIN REGULAR(HUMAN) IN NACL 100-0.9 UT/100ML-% IV SOLN
INTRAVENOUS | Status: AC
  Administered 2018-03-20: 1.9 [IU]/h via INTRAVENOUS
  Filled 2018-03-18 (×2): qty 100

## 2018-03-18 MED ORDER — DEXMEDETOMIDINE HCL IN NACL 400 MCG/100ML IV SOLN
0.1000 ug/kg/h | INTRAVENOUS | Status: AC
  Administered 2018-03-20: .5 ug/kg/h via INTRAVENOUS
  Filled 2018-03-18 (×2): qty 100

## 2018-03-18 MED ORDER — MAGNESIUM SULFATE 50 % IJ SOLN
40.0000 meq | INTRAMUSCULAR | Status: DC
  Filled 2018-03-18 (×2): qty 9.85

## 2018-03-18 MED ORDER — TRANEXAMIC ACID (OHS) PUMP PRIME SOLUTION
2.0000 mg/kg | INTRAVENOUS | Status: DC
  Filled 2018-03-18 (×2): qty 3.05

## 2018-03-18 MED ORDER — DOPAMINE-DEXTROSE 3.2-5 MG/ML-% IV SOLN
0.0000 ug/kg/min | INTRAVENOUS | Status: DC
  Filled 2018-03-18: qty 250

## 2018-03-18 MED ORDER — VANCOMYCIN HCL 10 G IV SOLR
1500.0000 mg | INTRAVENOUS | Status: AC
  Administered 2018-03-20: 1500 mg via INTRAVENOUS
  Filled 2018-03-18 (×2): qty 1500

## 2018-03-18 MED ORDER — PLASMA-LYTE 148 IV SOLN
INTRAVENOUS | Status: DC
  Filled 2018-03-18 (×2): qty 2.5

## 2018-03-18 MED ORDER — SODIUM CHLORIDE 0.9 % IV SOLN
750.0000 mg | INTRAVENOUS | Status: AC
  Administered 2018-03-20: 750 mg via INTRAVENOUS
  Filled 2018-03-18 (×2): qty 750

## 2018-03-18 MED ORDER — TRANEXAMIC ACID 1000 MG/10ML IV SOLN
1.5000 mg/kg/h | INTRAVENOUS | Status: AC
  Administered 2018-03-20: 09:00:00 via INTRAVENOUS
  Administered 2018-03-20: 1.5 mg/kg/h via INTRAVENOUS
  Filled 2018-03-18 (×2): qty 25

## 2018-03-18 MED ORDER — PHENYLEPHRINE HCL-NACL 20-0.9 MG/250ML-% IV SOLN
30.0000 ug/min | INTRAVENOUS | Status: DC
  Filled 2018-03-18 (×2): qty 250

## 2018-03-18 MED ORDER — EPINEPHRINE PF 1 MG/ML IJ SOLN
0.0000 ug/min | INTRAVENOUS | Status: DC
  Filled 2018-03-18 (×2): qty 4

## 2018-03-18 NOTE — H&P (Signed)
[address]       [place] [address]             [phone]      Cardiothoracic Surgery Admission History and Physical   PCP is Tempie Hoist, MD  Referring Provider is Sherren Mocha, MD      Chief Complaint  Patient presents with  . 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 His left ventricular function was normal and so he developed atrial flutter with rapid ventricular response and was shown to have an ejection fraction of 30%. He underwent atrial flutter ablation with restoration of sinus rhythm and his ejection fraction increased to 50 to 55% by echocardiogram on 12/16/2017. He underwent an exercise stress test on 01/09/2018 which showed an ejection fraction of 41%. There were no ST segment changes during stress. There is a medium size defect of moderate severity in the apical anterior, apical septal, apical inferior, apical lateral, and apical location findings consistent with ischemia. He was seen in follow-up by Dr. Burt Knack on 02/10/2018. He underwent cardiac catheterization on 02/22/2018 which showed 40 to 50% distal left main stenosis. There is chronic total occlusion of the proximal LAD with right to left collaterals supplying the mid and distal LAD. There is subtotal occlusion of the first diagonal branch. There is moderate stenosis in the large ramus branch of 60%. The right coronary was a large dominant vessel with mild nonobstructive disease. Left ventricular ejection fraction was 50 to  55%.  He works as a Transport planner. The patient denies any chest pain or pressure. He has had no pain in his neck or jaw or arms. He has had exertional fatigue and tiredness. He denies any shortness of breath with exertion. He has not been very active since his ablation. He has a history of lower extremity venous stasis ulcers and is undergone bilateral saphenous vein ablation with laser in the past in [place].      Past Medical History:  Diagnosis Date  . Atrial flutter with rapid ventricular response ([place]) 09/16/2017  . Cancer ([place])   . Diabetes mellitus without complication ([place])   . HTN (hypertension)   . Hyperlipidemia LDL goal <70   . OA (osteoarthritis)   . PAC (premature atrial contraction)   . Prostate cancer ([place])   . Sleep apnea         Past Surgical History:  Procedure Laterality Date  . A-FLUTTER ABLATION N/A 10/18/2017   Procedure: A-FLUTTER ABLATION; Surgeon: Evans Lance, MD; Location: [place] [facility]; Service: Cardiovascular; Laterality: N/A;  . CARDIAC CATHETERIZATION  09/2014   [facility] in [place], LAD 100%, anomalous CFX 40-50%, RCA patent  . GASTRIC BYPASS    . LEFT HEART CATH AND CORONARY ANGIOGRAPHY N/A 02/22/2018   Procedure: LEFT HEART CATH AND CORONARY ANGIOGRAPHY; Surgeon: Sherren Mocha, MD; Location: [place]; Service: Cardiovascular; Laterality: N/A;        Family History  Problem Relation Age of Onset  . Heart failure Mother   . Heart disease Mother   . Heart attack  Mother   . Hypertension Mother   . Stroke Father   . Kidney failure Father   . Cancer Father   . Hypertension Maternal Grandmother    Social History  Social History        Tobacco Use  . Smoking status: Never Smoker  . Smokeless tobacco: Never Used  Substance Use Topics  . Alcohol use: Yes    Comment: occ  . Drug use: Not on file         Current Outpatient Medications  Medication Sig Dispense Refill  . acetaminophen (TYLENOL) 500 MG tablet Take 1,000 mg by mouth  every 8 (eight) hours as needed (pain).    Marland Kitchen albuterol (PROVENTIL HFA;VENTOLIN HFA) 108 (90 Base) MCG/ACT inhaler Inhale 2 puffs into the lungs every 6 (six) hours as needed for wheezing or shortness of breath.    Marland Kitchen aspirin EC 81 MG tablet Take 81 mg by mouth at bedtime.     . ferrous sulfate 325 (65 FE) MG EC tablet Take 325-650 mg by mouth 2 (two) times daily. Take 2 tablets (650 mg) by mouth in the morning & 1 tablet (325 mg) by mouth at night.    . fluticasone (FLONASE) 50 MCG/ACT nasal spray Place 2 sprays into both nostrils daily as needed for allergies.     . hydrochlorothiazide (HYDRODIURIL) 25 MG tablet TAKE 1 TABLET (25 MG TOTAL) BY MOUTH DAILY. PLEASE KEEP UPCOMING APPT FOR FUTURE REFILLS. THANKS 90 tablet 2  . irbesartan (AVAPRO) 75 MG tablet Take 1 tablet (75 mg total) by mouth daily. 90 tablet 3  . isosorbide mononitrate (IMDUR) 30 MG 24 hr tablet TAKE ONE TABLET (30 MG TOTAL) BY MOUTH DAILY. (Patient taking differently: Take 30 mg by mouth daily. ) 30 tablet 10  . metFORMIN (GLUCOPHAGE) 500 MG tablet Take 1,000 mg by mouth 2 (two) times daily. Take 1000 mg in the morning and 500 mg in the evening  9  . metoprolol tartrate (LOPRESSOR) 50 MG tablet Take 0.5 tablets (25 mg total) by mouth 2 (two) times daily. 60 tablet 6  . Multiple Vitamins-Minerals (MULTIVITAMIN WITH MINERALS) tablet Take 1 tablet by mouth daily.    . naproxen sodium (ANAPROX) 220 MG tablet Take 440 mg by mouth daily as needed (pain).     . Omega-3 Fatty Acids (FISH OIL) 1000 MG CAPS Take 1,000 mg by mouth daily.    Vladimir Faster Glycol-Propyl Glycol (LUBRICANT EYE DROPS) 0.4-0.3 % SOLN Place 1 drop into both eyes 3 (three) times daily as needed (for dry/irritated eyes.).    Marland Kitchen pramipexole (MIRAPEX) 0.125 MG tablet Take 0.125 mg by mouth at bedtime.  2  . rosuvastatin (CRESTOR) 10 MG tablet Take 10 mg by mouth every evening.    . sodium fluoride (PREVIDENT 5000 PLUS) 1.1 % CREA dental cream Take 1 application by mouth 2  (two) times daily.     . tamsulosin (FLOMAX) 0.4 MG CAPS capsule Take 0.4 mg by mouth at bedtime.   3   No current facility-administered medications for this visit.         Allergies  Allergen Reactions  . Tape Rash    With prolonged use--blisters   Review of Systems   Constitutional: Positive for activity change and fatigue.  HENT: Positive for hearing loss.  Eyes: Negative.  Respiratory: Negative for chest tightness and shortness of breath.  Sleep apnea and uses CPAP at night.  Cardiovascular: Positive for leg swelling. Negative for chest pain and palpitations.  Gastrointestinal:  Negative.  Genitourinary:  Had prostate radioactive seed implant 12 yrs ago.  Musculoskeletal: Positive for arthralgias.  Skin: Negative.  Allergic/Immunologic: Negative.  Neurological: Negative.  Hematological: Negative.  Psychiatric/Behavioral: Negative.   BP 133/70  Pulse (!) 48  Resp 20  Ht 6\' 3"  (1.905 m)  Wt (!) 330 lb (149.7 kg)  SpO2 98% Comment: RA  BMI 41.25 kg/m   Physical Exam   Constitutional: He is oriented to person, place, and time. No distress.  Morbidly obese gentleman with BMI 41.3.  HENT:  Head: Normocephalic and atraumatic.  Mouth/Throat: Oropharynx is clear and moist.  Eyes: Pupils are equal, round, and reactive to light. EOM are normal.  Neck: Normal range of motion. Neck supple. No JVD present.  Cardiovascular: Normal rate, regular rhythm and normal heart sounds.  No murmur heard.  Pulmonary/Chest: Effort normal and breath sounds normal. No respiratory distress.  Abdominal: He exhibits no distension. There is no tenderness.  Musculoskeletal: He exhibits edema.  Neurological: He is alert and oriented to person, place, and time.  Skin: Skin is warm and dry.  Psychiatric: He has a normal mood and affect.   Diagnostic Tests:  [facility]  [address], [place] [address]  [phone]   -------------------------------------------------------------------  Echocardiography  Patient: Ozie, Lupe  MR #: [id number]  Study Date: 12/16/2017  Gender: M  Age: 60  Height: 190.5 cm  Weight: 150.3 kg  BSA: 2.88 m^2  Pt. Status:  Room:  SONOGRAPHER Victorio Palm, RDCS  ATTENDING Sherren Mocha, MD  ORDERING Sherren Mocha, MD  REFERRING Sherren Mocha, MD  PERFORMING [facility], Outpatient  cc:  -------------------------------------------------------------------  LV EF: 50% - 55%  -------------------------------------------------------------------  Indications: (I25.10). (I48.3).  -------------------------------------------------------------------  History: PMH: Acquired from the patient and from the patient&'s  chart. Atrial flutter. Coronary artery disease. Risk factors:  Hypertension. Diabetes mellitus. Morbidly obese. Dyslipidemia.  -------------------------------------------------------------------  Study Conclusions  - Left ventricle: The cavity size was normal. There was moderate  concentric hypertrophy. Systolic function was normal. The  estimated ejection fraction was in the range of 50% to 55%.  Doppler parameters are consistent with abnormal left ventricular  relaxation (grade 1 diastolic dysfunction). Doppler parameters  are consistent with elevated ventricular end-diastolic filling  pressure.  - Aortic valve: There was no regurgitation.  - Ascending aorta: The ascending aorta was mildly dilated measuring  40 mm.  - Mitral valve: Calcified annulus. Mildly thickened leaflets .  - Right ventricle: The cavity size was normal. Wall thickness was  normal. Systolic pressure was within the normal range.  - Tricuspid valve: There was trivial regurgitation.  - Pulmonary arteries: Systolic pressure was within the normal  range.  - Inferior vena cava: The vessel was normal in size. The  respirophasic diameter changes were in the normal range (>= 50%),   consistent with normal central venous pressure.  Impressions:  - Since the last study on 09/28/2017 LVEF has improved from 30-35% to  50-55%.  -------------------------------------------------------------------  Labs, prior tests, procedures, and surgery:  Echocardiography (June 2019). EF was 30%.  Catheterization (2016). The study demonstrated coronary artery  disease. There was an occlusion in the left anterior descending  coronary artery with collaterals.  Electrophysiology study with ablation. Atrial flutter  radiofrequency ablation.  -------------------------------------------------------------------  Study data: Comparison was made to the study of June 2019. Study  status: Routine. Procedure: The patient reported no pain pre or  post test. Transthoracic echocardiography for left ventricular  function evaluation, for right  ventricular function evaluation, and  for assessment of valvular function. Image quality was adequate.  The study was technically difficult, as a result of poor sound wave  transmission. Intravenous contrast (Definity) was administered to  opacify the LV. Study completion: There were no complications.  Echocardiography. M-mode, complete 2D, spectral Doppler, and  color Doppler. Birthdate: Patient birthdate: [birth year]. Age:  Patient is 67 yr old. Sex: Gender: male. BMI: 41.4 kg/m^2.  Blood pressure: 138/70 Patient status: Outpatient. Study  date: Study date: 12/16/2017. Study time: 09:35 AM. Location:  [facility]  -------------------------------------------------------------------  -------------------------------------------------------------------  Left ventricle: The cavity size was normal. There was moderate  concentric hypertrophy. Systolic function was normal. The estimated  ejection fraction was in the range of 50% to 55%. Doppler  parameters are consistent with abnormal left ventricular relaxation  (grade 1 diastolic dysfunction). Doppler  parameters are consistent  with elevated ventricular end-diastolic filling pressure.  -------------------------------------------------------------------  Aortic valve: Trileaflet; moderately thickened, moderately  calcified leaflets. Sclerosis without stenosis. Doppler: There  was no regurgitation.  -------------------------------------------------------------------  Aorta: Ascending aorta: The ascending aorta was mildly dilated  measuring 40 mm.  -------------------------------------------------------------------  Mitral valve: Calcified annulus. Mildly thickened leaflets .  Doppler: Peak gradient (D): 4 mm Hg.  -------------------------------------------------------------------  Right ventricle: The cavity size was normal. Wall thickness was  normal. Systolic function was normal. Systolic pressure was within  the normal range.  -------------------------------------------------------------------  Pulmonic valve: Structurally normal valve. Cusp separation was  normal. Doppler: Transvalvular velocity was within the normal  range. There was no regurgitation.  -------------------------------------------------------------------  Tricuspid valve: Structurally normal valve. Leaflet separation  was normal. Doppler: Transvalvular velocity was within the normal  range. There was trivial regurgitation.  -------------------------------------------------------------------  Pulmonary artery: Systolic pressure was within the normal range.  -------------------------------------------------------------------  Systemic veins:  Inferior vena cava: The vessel was normal in size. The  respirophasic diameter changes were in the normal range (>= 50%),  consistent with normal central venous pressure.  -------------------------------------------------------------------  Measurements  Left ventricle Value Reference  LV ID, ED, PLAX chordal 52 mm 43 - 52  LV ID, ES, PLAX chordal 35 mm 23 - 38  LV fx  shortening, PLAX chordal 33 % >=29  LV PW thickness, ED 15 mm ---------  IVS/LV PW ratio, ED 0.87 <=1.3  Stroke volume, 2D 170 ml ---------  Stroke volume/bsa, 2D 59 ml/m^2 ---------  LV e&', lateral 9.87 cm/s ---------  LV E/e&', lateral 9.48 ---------  LV e&', medial 5.18 cm/s ---------  LV E/e&', medial 18.07 ---------  LV e&', average 7.53 cm/s ---------  LV E/e&', average 12.44 ---------  Ventricular septum Value Reference  IVS thickness, ED 13 mm ---------  LVOT Value Reference  LVOT ID, S 31 mm ---------  LVOT area 7.55 cm^2 ---------  LVOT ID 31 mm ---------  LVOT peak velocity, S 90.4 cm/s ---------  LVOT mean velocity, S 65.2 cm/s ---------  LVOT VTI, S 22.5 cm ---------  Stroke volume (SV), LVOT DP 169.8 ml ---------  Stroke index (SV/bsa), LVOT DP 58.9 ml/m^2 ---------  Aorta Value Reference  Aortic root ID, ED 40 mm ---------  Ascending aorta ID, A-P, S 40 mm ---------  Left atrium Value Reference  LA ID, A-P, ES 44 mm ---------  LA ID/bsa, A-P 1.53 cm/m^2 <=2.2  LA volume, S 48.1 ml ---------  LA volume/bsa, S 16.7 ml/m^2 ---------  LA volume, ES, 1-p A4C 41.8 ml ---------  LA volume/bsa, ES, 1-p A4C 14.5 ml/m^2 ---------  LA volume,  ES, 1-p A2C 54.3 ml ---------  LA volume/bsa, ES, 1-p A2C 18.8 ml/m^2 ---------  Mitral valve Value Reference  Mitral E-wave peak velocity 93.6 cm/s ---------  Mitral A-wave peak velocity 105 cm/s ---------  Mitral deceleration time (H) 401 ms 150 - 230  Mitral peak gradient, D 4 mm Hg ---------  Mitral E/A ratio, peak 0.8 ---------  Right atrium Value Reference  RA ID, S-I, ES 52 mm ---------  RA ID, M-L, ES, A4C (H) 49 mm 30 - 46  Right ventricle Value Reference  RV ID, minor axis, ED, A4C base 49 mm ---------  TAPSE 27.2 mm ---------  RV s&', lateral, S 11 cm/s ---------  Legend:  (L) and (H) mark values outside specified reference range.  -------------------------------------------------------------------  Prepared and  Electronically Authenticated by  Ena Dawley, M.D.  2019-08-23T10:38:02  Physicians  Panel Physicians Referring Physician Case Authorizing Physician  Sherren Mocha, MD (Primary)    Procedures  LEFT HEART CATH AND CORONARY ANGIOGRAPHY  Conclusion  Prox RCA to Mid RCA lesion is 25% stenosed.  Post Atrio lesion is 50% stenosed.  Mid LM to Dist LM lesion is 50% stenosed.  Ost Ramus to Ramus lesion is 60% stenosed.  Ost Cx to Prox Cx lesion is 99% stenosed.  Ost LAD to Prox LAD lesion is 95% stenosed.  Prox LAD lesion is 100% stenosed.  Ost 1st Diag lesion is 99% stenosed.  The left ventricular ejection fraction is 50-55% by visual estimate. 1. Mild to moderate distal left main stenosis estimated at 40 to 50%  2. Chronic total occlusion of the proximal LAD with right to left collaterals supplying the mid and distal LAD territory  3. Subtotal occlusion of the first diagonal branch in the left circumflex  4. Moderate stenosis of the large ramus intermedius branch estimated at 60%  5. Patent RCA with mild nonobstructive stenosis (large dominant vessel)  6. Mild segmental contraction abnormality the left ventricle with preserved overall LV systolic function  Recommend: The patient does not have typical symptoms of angina. However he has moderate ischemia on stress testing and based on assessment of his coronary anatomy as at large burden of potential ischemia involving the LAD, diagonal branch, circumflex, and intermediate branches. I do not think he has any feasible PCI options. I am going to refer him for an outpatient cardiac surgical evaluation for consideration of CABG versus ongoing medical therapy.   Indications  Abnormal nuclear cardiac imaging test [R93.1 (ICD-10-CM)]  Procedural Details/Technique  Technical Details INDICATION: 67 year old gentleman with diabetes and morbid obesity who has known occlusion of the proximal LAD. Earlier this year he developed atrial flutter with RVR  and marked deterioration in LV systolic function. He underwent flutter ablation and his LVEF has improved, now estimated at 50 to 55% by echo assessment. The patient has never had symptoms of typical angina. An exercise Myoview stress test has been performed and it demonstrates anteroapical ischemia, LVEF 41%, and is interpreted as an intermediate risk study. He is referred for cardiac catheterization to assess revascularization options.  PROCEDURAL DETAILS: The right wrist was prepped, draped, and anesthetized with 1% lidocaine. Using the modified Seldinger technique, a 5/6 French Slender sheath was introduced into the right radial artery. 3 mg of verapamil was administered through the sheath, weight-based unfractionated heparin was administered intravenously. Standard Judkins catheters were used for selective coronary angiography and left ventriculography. Catheter exchanges were performed over an exchange length guidewire. There were no immediate procedural complications. A TR band was used  for radial hemostasis at the completion of the procedure. The patient was transferred to the post catheterization recovery area for further monitoring.     Estimated blood loss <50 mL.  During this procedure the patient was administered the following to achieve and maintain moderate conscious sedation: Versed 2 mg, Fentanyl 25 mcg, while the patient's heart rate, blood pressure, and oxygen saturation were continuously monitored. The period of conscious sedation was 20 minutes, of which I was present face-to-face 100% of this time.  Coronary Findings  Diagnostic  Dominance: Right  Left Main  Mid LM to Dist LM lesion 50% stenosed  Mid LM to Dist LM lesion is 50% stenosed.  Left Anterior Descending  Ost LAD to Prox LAD lesion 95% stenosed  Ost LAD to Prox LAD lesion is 95% stenosed.  Prox LAD lesion 100% stenosed  Prox LAD lesion is 100% stenosed. The LAD is occluded proximally at the origin of the first  diagonal. The vessel fills via a well-formed collateral from the distal RCA.  First Diagonal Branch  Ost 1st Diag lesion 99% stenosed  Ost 1st Diag lesion is 99% stenosed. the first diagonal is large in caliber with subtotal proximal occlusion. Fills late.  Second Septal Branch  Collaterals  2nd Sept filled by collaterals from 1st RPLB.    Third Septal Branch  Collaterals  3rd Sept filled by collaterals from [place].    Ramus Intermedius  Ost Ramus to Ramus lesion 60% stenosed  Ost Ramus to Ramus lesion is 60% stenosed. Diffuse 60% stenosis of the ramus intermedius. This branch is large and supplies multiple sub-branches.  Left Circumflex  Ost Cx to Prox Cx lesion 99% stenosed  Ost Cx to Prox Cx lesion is 99% stenosed. small circumflex, subtotal proximal occlusion, fills late  Right Coronary Artery  Prox RCA to Mid RCA lesion 25% stenosed  Prox RCA to Mid RCA lesion is 25% stenosed.  Right Posterior Atrioventricular Branch  Post Atrio lesion 50% stenosed  Post Atrio lesion is 50% stenosed.  Intervention  No interventions have been documented.  Wall Motion       Resting       The mid anterior, basilar anterior, basilar inferior and apical anterior segments are normal. The mid inferior and apical inferior segments are hypokinetic.       Left Heart  Left Ventricle The left ventricular ejection fraction is 50-55% by visual estimate. There are LV function abnormalities due to segmental dysfunction.  Coronary Diagrams  Diagnostic Diagram     Implants     No implant documentation for this case.  MERGE Images  Link to Procedure Log   Show images for CARDIAC CATHETERIZATION Procedure Log  Hemo Data   Most Recent Value  AO Systolic Pressure 191 mmHg  AO Diastolic Pressure 64 mmHg  AO Mean 83 mmHg  LV Systolic Pressure 478 mmHg  LV Diastolic Pressure 3 mmHg  LV EDP 12 mmHg  AOp Systolic Pressure 295 mmHg  AOp Diastolic Pressure 59 mmHg  AOp Mean Pressure 84 mmHg  LVp Systolic  Pressure 621 mmHg  LVp Diastolic Pressure 1 mmHg  LVp EDP Pressure 12 mmHg    Impression:   This 67 year old gentleman has severe two-vessel coronary disease with a chronically occluded LAD supplied by collaterals from the right coronary artery and a moderate size ramus branch with 60% proximal stenosis. His nuclear stress test shows a large ischemic burden in the anterior wall and septum that could be significantly improved with coronary bypass graft surgery.  He has had exertional fatigue. I think coronary bypass graft surgery is the best treatment to preserve his viable myocardium in the anterior wall and septum and to try to improve his exertional fatigue. I discussed the option of coronary bypass graft surgery as well as the option of continued medical treatment. I reviewed the cardiac catheterization films with the patient and his wife and answered their questions. They would like to proceed with surgical treatment. He will probably not have any suitable saphenous vein since he had bilateral saphenous vein ablation with laser. His upper extremity arterial dopplers show that the palmar arch signal is obliterated with ulnar and radial compression so we can not use either radial artery. I will plan on using both left and right internal mammary arteries. I discussed the operative procedure with the patient and his wife including alternatives, benefits and risks; including but not limited to bleeding, blood transfusion, infection, stroke, myocardial infarction, graft failure, heart block requiring a permanent pacemaker, organ dysfunction, and death. Lanora Manis III understands and agrees to proceed.   Plan:   Coronary bypass graft surgery using bilateral IMA's.  [name], MD  [facility]  [phone]

## 2018-03-20 ENCOUNTER — Inpatient Hospital Stay (HOSPITAL_COMMUNITY): Payer: BLUE CROSS/BLUE SHIELD

## 2018-03-20 ENCOUNTER — Inpatient Hospital Stay (HOSPITAL_COMMUNITY): Payer: BLUE CROSS/BLUE SHIELD | Admitting: Certified Registered Nurse Anesthetist

## 2018-03-20 ENCOUNTER — Inpatient Hospital Stay (HOSPITAL_COMMUNITY): Payer: BLUE CROSS/BLUE SHIELD | Admitting: Physician Assistant

## 2018-03-20 ENCOUNTER — Encounter (HOSPITAL_COMMUNITY)
Admission: RE | Disposition: A | Discharge status: Home / Self Care | Payer: Self-pay | Source: Ambulatory Visit | Attending: Surgery

## 2018-03-20 ENCOUNTER — Inpatient Hospital Stay (HOSPITAL_COMMUNITY)
Admission: RE | Admit: 2018-03-20 | Discharge: 2018-03-24 | DRG: 236 | Disposition: A | Discharge status: Home / Self Care | Payer: BLUE CROSS/BLUE SHIELD | Source: Ambulatory Visit | Attending: Surgery | Admitting: Surgery

## 2018-03-20 DIAGNOSIS — Z79899 Other long term (current) drug therapy: Secondary | ICD-10-CM

## 2018-03-20 DIAGNOSIS — I2510 Atherosclerotic heart disease of native coronary artery without angina pectoris: Principal | ICD-10-CM | POA: Diagnosis present

## 2018-03-20 DIAGNOSIS — S2220XA Unspecified fracture of sternum, initial encounter for closed fracture: Secondary | ICD-10-CM | POA: Diagnosis present

## 2018-03-20 DIAGNOSIS — Z9884 Bariatric surgery status: Secondary | ICD-10-CM

## 2018-03-20 DIAGNOSIS — E119 Type 2 diabetes mellitus without complications: Secondary | ICD-10-CM | POA: Diagnosis present

## 2018-03-20 DIAGNOSIS — Z8546 Personal history of malignant neoplasm of prostate: Secondary | ICD-10-CM | POA: Diagnosis not present

## 2018-03-20 DIAGNOSIS — G4733 Obstructive sleep apnea (adult) (pediatric): Secondary | ICD-10-CM | POA: Diagnosis present

## 2018-03-20 DIAGNOSIS — D62 Acute posthemorrhagic anemia: Secondary | ICD-10-CM | POA: Diagnosis not present

## 2018-03-20 DIAGNOSIS — I10 Essential (primary) hypertension: Secondary | ICD-10-CM | POA: Diagnosis present

## 2018-03-20 DIAGNOSIS — Z6841 Body Mass Index (BMI) 40.0 and over, adult: Secondary | ICD-10-CM | POA: Diagnosis not present

## 2018-03-20 DIAGNOSIS — E785 Hyperlipidemia, unspecified: Secondary | ICD-10-CM | POA: Diagnosis present

## 2018-03-20 DIAGNOSIS — Z7982 Long term (current) use of aspirin: Secondary | ICD-10-CM

## 2018-03-20 DIAGNOSIS — S2222XA Fracture of body of sternum, initial encounter for closed fracture: Secondary | ICD-10-CM

## 2018-03-20 DIAGNOSIS — I4892 Unspecified atrial flutter: Secondary | ICD-10-CM | POA: Diagnosis present

## 2018-03-20 DIAGNOSIS — I2582 Chronic total occlusion of coronary artery: Secondary | ICD-10-CM | POA: Diagnosis present

## 2018-03-20 DIAGNOSIS — X58XXXA Exposure to other specified factors, initial encounter: Secondary | ICD-10-CM | POA: Diagnosis present

## 2018-03-20 DIAGNOSIS — Z7984 Long term (current) use of oral hypoglycemic drugs: Secondary | ICD-10-CM

## 2018-03-20 DIAGNOSIS — Z951 Presence of aortocoronary bypass graft: Secondary | ICD-10-CM

## 2018-03-20 HISTORY — PX: TEE WITHOUT CARDIOVERSION: SHX5443

## 2018-03-20 HISTORY — PX: CORONARY ARTERY BYPASS GRAFT: SHX141

## 2018-03-20 HISTORY — PX: RADIAL ARTERY HARVEST: SHX5067

## 2018-03-20 LAB — POCT I-STAT, CHEM 8
BUN: 16 mg/dL (ref 8–23)
BUN: 12 mg/dL (ref 8–23)
BUN: 14 mg/dL (ref 8–23)
BUN: 13 mg/dL (ref 8–23)
BUN: 12 mg/dL (ref 8–23)
BUN: 11 mg/dL (ref 8–23)
CALCIUM ION: 1.17 mmol/L (ref 1.15–1.40)
CHLORIDE: 102 mmol/L (ref 98–111)
CHLORIDE: 102 mmol/L (ref 98–111)
CREATININE: 0.6 mg/dL — AB (ref 0.61–1.24)
CREATININE: 0.5 mg/dL — AB (ref 0.61–1.24)
CREATININE: 0.6 mg/dL — AB (ref 0.61–1.24)
Calcium, Ion: 1.24 mmol/L (ref 1.15–1.40)
Calcium, Ion: 1.13 mmol/L — ABNORMAL LOW (ref 1.15–1.40)
Calcium, Ion: 1.23 mmol/L (ref 1.15–1.40)
Calcium, Ion: 1.14 mmol/L — ABNORMAL LOW (ref 1.15–1.40)
Calcium, Ion: 1.19 mmol/L (ref 1.15–1.40)
Chloride: 102 mmol/L (ref 98–111)
Chloride: 100 mmol/L (ref 98–111)
Chloride: 102 mmol/L (ref 98–111)
Chloride: 104 mmol/L (ref 98–111)
Creatinine, Ser: 0.7 mg/dL (ref 0.61–1.24)
Creatinine, Ser: 0.6 mg/dL — ABNORMAL LOW (ref 0.61–1.24)
Creatinine, Ser: 0.5 mg/dL — ABNORMAL LOW (ref 0.61–1.24)
GLUCOSE: 157 mg/dL — AB (ref 70–99)
GLUCOSE: 143 mg/dL — AB (ref 70–99)
GLUCOSE: 138 mg/dL — AB (ref 70–99)
GLUCOSE: 159 mg/dL — AB (ref 70–99)
Glucose, Bld: 137 mg/dL — ABNORMAL HIGH (ref 70–99)
Glucose, Bld: 158 mg/dL — ABNORMAL HIGH (ref 70–99)
HCT: 36 % — ABNORMAL LOW (ref 39.0–52.0)
HCT: 31 % — ABNORMAL LOW (ref 39.0–52.0)
HCT: 34 % — ABNORMAL LOW (ref 39.0–52.0)
HCT: 28 % — ABNORMAL LOW (ref 39.0–52.0)
HCT: 31 % — ABNORMAL LOW (ref 39.0–52.0)
HCT: 33 % — ABNORMAL LOW (ref 39.0–52.0)
HEMOGLOBIN: 12.2 g/dL — AB (ref 13.0–17.0)
HEMOGLOBIN: 10.5 g/dL — AB (ref 13.0–17.0)
HEMOGLOBIN: 11.6 g/dL — AB (ref 13.0–17.0)
HEMOGLOBIN: 11.2 g/dL — AB (ref 13.0–17.0)
Hemoglobin: 9.5 g/dL — ABNORMAL LOW (ref 13.0–17.0)
Hemoglobin: 10.5 g/dL — ABNORMAL LOW (ref 13.0–17.0)
POTASSIUM: 4.2 mmol/L (ref 3.5–5.1)
POTASSIUM: 3.8 mmol/L (ref 3.5–5.1)
POTASSIUM: 4.1 mmol/L (ref 3.5–5.1)
POTASSIUM: 3.8 mmol/L (ref 3.5–5.1)
Potassium: 4.4 mmol/L (ref 3.5–5.1)
Potassium: 4.6 mmol/L (ref 3.5–5.1)
Sodium: 139 mmol/L (ref 135–145)
Sodium: 139 mmol/L (ref 135–145)
Sodium: 140 mmol/L (ref 135–145)
Sodium: 137 mmol/L (ref 135–145)
Sodium: 140 mmol/L (ref 135–145)
Sodium: 137 mmol/L (ref 135–145)
TCO2: 29 mmol/L (ref 22–32)
TCO2: 27 mmol/L (ref 22–32)
TCO2: 30 mmol/L (ref 22–32)
TCO2: 29 mmol/L (ref 22–32)
TCO2: 26 mmol/L (ref 22–32)
TCO2: 26 mmol/L (ref 22–32)

## 2018-03-20 LAB — POCT I-STAT 3, ART BLOOD GAS (G3+)
ACID-BASE DEFICIT: 2 mmol/L (ref 0.0–2.0)
Acid-Base Excess: 4 mmol/L — ABNORMAL HIGH (ref 0.0–2.0)
Acid-base deficit: 1 mmol/L (ref 0.0–2.0)
Acid-base deficit: 3 mmol/L — ABNORMAL HIGH (ref 0.0–2.0)
BICARBONATE: 26.6 mmol/L (ref 20.0–28.0)
BICARBONATE: 30.3 mmol/L — AB (ref 20.0–28.0)
BICARBONATE: 25 mmol/L (ref 20.0–28.0)
BICARBONATE: 21.9 mmol/L (ref 20.0–28.0)
BICARBONATE: 23.2 mmol/L (ref 20.0–28.0)
Bicarbonate: 26.9 mmol/L (ref 20.0–28.0)
O2 SAT: 99 %
O2 SAT: 97 %
O2 Saturation: 100 %
O2 Saturation: 99 %
O2 Saturation: 99 %
O2 Saturation: 94 %
PCO2 ART: 51.6 mmHg — AB (ref 32.0–48.0)
PCO2 ART: 44.4 mmHg (ref 32.0–48.0)
PCO2 ART: 37.7 mmHg (ref 32.0–48.0)
PH ART: 7.325 — AB (ref 7.350–7.450)
PH ART: 7.352 (ref 7.350–7.450)
PH ART: 7.372 (ref 7.350–7.450)
PO2 ART: 69 mmHg — AB (ref 83.0–108.0)
PO2 ART: 119 mmHg — AB (ref 83.0–108.0)
Patient temperature: 35.8
Patient temperature: 37
TCO2: 28 mmol/L (ref 22–32)
TCO2: 32 mmol/L (ref 22–32)
TCO2: 28 mmol/L (ref 22–32)
TCO2: 26 mmol/L (ref 22–32)
TCO2: 23 mmol/L (ref 22–32)
TCO2: 24 mmol/L (ref 22–32)
pCO2 arterial: 52.6 mmHg — ABNORMAL HIGH (ref 32.0–48.0)
pCO2 arterial: 53.7 mmHg — ABNORMAL HIGH (ref 32.0–48.0)
pCO2 arterial: 39.5 mmHg (ref 32.0–48.0)
pH, Arterial: 7.302 — ABNORMAL LOW (ref 7.350–7.450)
pH, Arterial: 7.369 (ref 7.350–7.450)
pH, Arterial: 7.379 (ref 7.350–7.450)
pO2, Arterial: 136 mmHg — ABNORMAL HIGH (ref 83.0–108.0)
pO2, Arterial: 410 mmHg — ABNORMAL HIGH (ref 83.0–108.0)
pO2, Arterial: 160 mmHg — ABNORMAL HIGH (ref 83.0–108.0)
pO2, Arterial: 97 mmHg (ref 83.0–108.0)

## 2018-03-20 LAB — CREATININE, SERUM: CREATININE: 0.67 mg/dL (ref 0.61–1.24)

## 2018-03-20 LAB — GLUCOSE, CAPILLARY
Glucose-Capillary: 154 mg/dL — ABNORMAL HIGH (ref 70–99)
Glucose-Capillary: 145 mg/dL — ABNORMAL HIGH (ref 70–99)
Glucose-Capillary: 100 mg/dL — ABNORMAL HIGH (ref 70–99)
Glucose-Capillary: 118 mg/dL — ABNORMAL HIGH (ref 70–99)
Glucose-Capillary: 120 mg/dL — ABNORMAL HIGH (ref 70–99)
Glucose-Capillary: 131 mg/dL — ABNORMAL HIGH (ref 70–99)
Glucose-Capillary: 144 mg/dL — ABNORMAL HIGH (ref 70–99)
Glucose-Capillary: 183 mg/dL — ABNORMAL HIGH (ref 70–99)
Glucose-Capillary: 154 mg/dL — ABNORMAL HIGH (ref 70–99)
Glucose-Capillary: 111 mg/dL — ABNORMAL HIGH (ref 70–99)

## 2018-03-20 LAB — CBC
HCT: 34.3 % — ABNORMAL LOW (ref 39.0–52.0)
HEMATOCRIT: 37.2 % — AB (ref 39.0–52.0)
Hemoglobin: 11.2 g/dL — ABNORMAL LOW (ref 13.0–17.0)
Hemoglobin: 12 g/dL — ABNORMAL LOW (ref 13.0–17.0)
MCH: 30.8 pg (ref 26.0–34.0)
MCH: 30.4 pg (ref 26.0–34.0)
MCHC: 32.7 g/dL (ref 30.0–36.0)
MCHC: 32.3 g/dL (ref 30.0–36.0)
MCV: 94.2 fL (ref 80.0–100.0)
MCV: 94.2 fL (ref 80.0–100.0)
PLATELETS: 139 10*3/uL — AB (ref 150–400)
PLATELETS: 170 10*3/uL (ref 150–400)
RBC: 3.64 MIL/uL — AB (ref 4.22–5.81)
RBC: 3.95 MIL/uL — ABNORMAL LOW (ref 4.22–5.81)
RDW: 12.2 % (ref 11.5–15.5)
RDW: 12.4 % (ref 11.5–15.5)
WBC: 11.8 10*3/uL — ABNORMAL HIGH (ref 4.0–10.5)
WBC: 15.5 10*3/uL — ABNORMAL HIGH (ref 4.0–10.5)
nRBC: 0 % (ref 0.0–0.2)
nRBC: 0 % (ref 0.0–0.2)

## 2018-03-20 LAB — POCT I-STAT 4, (NA,K, GLUC, HGB,HCT)
Glucose, Bld: 114 mg/dL — ABNORMAL HIGH (ref 70–99)
HCT: 30 % — ABNORMAL LOW (ref 39.0–52.0)
HEMOGLOBIN: 10.2 g/dL — AB (ref 13.0–17.0)
POTASSIUM: 3.9 mmol/L (ref 3.5–5.1)
Sodium: 142 mmol/L (ref 135–145)

## 2018-03-20 LAB — PROTIME-INR
INR: 1.51
PROTHROMBIN TIME: 18 s — AB (ref 11.4–15.2)

## 2018-03-20 LAB — HEMOGLOBIN AND HEMATOCRIT, BLOOD
HCT: 30.7 % — ABNORMAL LOW (ref 39.0–52.0)
Hemoglobin: 9.9 g/dL — ABNORMAL LOW (ref 13.0–17.0)

## 2018-03-20 LAB — APTT: aPTT: 32 seconds (ref 24–36)

## 2018-03-20 LAB — PREPARE RBC (CROSSMATCH)

## 2018-03-20 LAB — PLATELET COUNT: Platelets: 201 10*3/uL (ref 150–400)

## 2018-03-20 LAB — MAGNESIUM: Magnesium: 2.3 mg/dL (ref 1.7–2.4)

## 2018-03-20 SURGERY — CORONARY ARTERY BYPASS GRAFTING (CABG)
Anesthesia: General | Site: Chest

## 2018-03-20 MED ORDER — ROCURONIUM BROMIDE 10 MG/ML (PF) SYRINGE
PREFILLED_SYRINGE | INTRAVENOUS | Status: DC | PRN
  Administered 2018-03-20 (×3): 50 mg via INTRAVENOUS

## 2018-03-20 MED ORDER — CHLORHEXIDINE GLUCONATE 0.12 % MT SOLN
15.0000 mL | OROMUCOSAL | Status: AC
  Administered 2018-03-20: 15 mL via OROMUCOSAL

## 2018-03-20 MED ORDER — DOCUSATE SODIUM 100 MG PO CAPS
200.0000 mg | ORAL_CAPSULE | Freq: Every day | ORAL | Status: DC
  Administered 2018-03-21 – 2018-03-22 (×2): 200 mg via ORAL
  Filled 2018-03-20 (×2): qty 2

## 2018-03-20 MED ORDER — SODIUM CHLORIDE 0.9 % IV SOLN: 250.0000 mL | INTRAVENOUS | Status: DC

## 2018-03-20 MED ORDER — ROCURONIUM BROMIDE 50 MG/5ML IV SOSY
PREFILLED_SYRINGE | INTRAVENOUS | Status: AC
  Filled 2018-03-20: qty 5

## 2018-03-20 MED ORDER — CHLORHEXIDINE GLUCONATE 0.12 % MT SOLN: 15.0000 mL | Freq: Once | OROMUCOSAL | Status: DC

## 2018-03-20 MED ORDER — ASPIRIN 81 MG PO CHEW: 324.0000 mg | CHEWABLE_TABLET | Freq: Every day | ORAL | Status: DC

## 2018-03-20 MED ORDER — LACTATED RINGERS IV SOLN
INTRAVENOUS | Status: DC | PRN
  Administered 2018-03-20 (×2): via INTRAVENOUS

## 2018-03-20 MED ORDER — ONDANSETRON HCL 4 MG/2ML IJ SOLN
INTRAMUSCULAR | Status: AC
  Filled 2018-03-20: qty 2

## 2018-03-20 MED ORDER — ROSUVASTATIN CALCIUM 5 MG PO TABS
10.0000 mg | ORAL_TABLET | Freq: Every evening | ORAL | Status: DC
  Administered 2018-03-21 – 2018-03-23 (×3): 10 mg via ORAL
  Filled 2018-03-20 (×3): qty 2
  Filled 2018-03-20: qty 1

## 2018-03-20 MED ORDER — SODIUM CHLORIDE 0.9% IV SOLUTION: Freq: Once | INTRAVENOUS | Status: DC

## 2018-03-20 MED ORDER — MIDAZOLAM HCL (PF) 10 MG/2ML IJ SOLN
INTRAMUSCULAR | Status: AC
  Filled 2018-03-20: qty 2

## 2018-03-20 MED ORDER — SODIUM CHLORIDE 0.9% FLUSH: 3.0000 mL | INTRAVENOUS | Status: DC | PRN

## 2018-03-20 MED ORDER — BISACODYL 10 MG RE SUPP: 10.0000 mg | Freq: Every day | RECTAL | Status: DC

## 2018-03-20 MED ORDER — ASPIRIN EC 325 MG PO TBEC
325.0000 mg | DELAYED_RELEASE_TABLET | Freq: Every day | ORAL | Status: DC
  Administered 2018-03-21 – 2018-03-22 (×2): 325 mg via ORAL
  Filled 2018-03-20 (×2): qty 1

## 2018-03-20 MED ORDER — ARTIFICIAL TEARS OPHTHALMIC OINT
TOPICAL_OINTMENT | OPHTHALMIC | Status: DC | PRN
  Administered 2018-03-20: 1 via OPHTHALMIC

## 2018-03-20 MED ORDER — 0.9 % SODIUM CHLORIDE (POUR BTL) OPTIME
TOPICAL | Status: DC | PRN
  Administered 2018-03-20: 6000 mL

## 2018-03-20 MED ORDER — TRAMADOL HCL 50 MG PO TABS
50.0000 mg | ORAL_TABLET | ORAL | Status: DC | PRN
  Administered 2018-03-20 – 2018-03-21 (×2): 100 mg via ORAL
  Filled 2018-03-20 (×2): qty 2

## 2018-03-20 MED ORDER — FENTANYL CITRATE (PF) 250 MCG/5ML IJ SOLN
INTRAMUSCULAR | Status: DC | PRN
  Administered 2018-03-20: 400 ug via INTRAVENOUS
  Administered 2018-03-20 (×2): 250 ug via INTRAVENOUS
  Administered 2018-03-20: 150 ug via INTRAVENOUS
  Administered 2018-03-20: 100 ug via INTRAVENOUS
  Administered 2018-03-20: 250 ug via INTRAVENOUS
  Administered 2018-03-20: 75 ug via INTRAVENOUS
  Administered 2018-03-20: 25 ug via INTRAVENOUS

## 2018-03-20 MED ORDER — OXYCODONE HCL 5 MG PO TABS
5.0000 mg | ORAL_TABLET | ORAL | Status: DC | PRN
  Administered 2018-03-20 – 2018-03-21 (×4): 10 mg via ORAL
  Administered 2018-03-21: 5 mg via ORAL
  Administered 2018-03-22: 10 mg via ORAL
  Filled 2018-03-20: qty 2
  Filled 2018-03-20: qty 1
  Filled 2018-03-20 (×4): qty 2

## 2018-03-20 MED ORDER — TAMSULOSIN HCL 0.4 MG PO CAPS
0.4000 mg | ORAL_CAPSULE | Freq: Every day | ORAL | Status: DC
  Administered 2018-03-21 – 2018-03-23 (×3): 0.4 mg via ORAL
  Filled 2018-03-20 (×3): qty 1

## 2018-03-20 MED ORDER — SODIUM CHLORIDE (PF) 0.9 % IJ SOLN
INTRAMUSCULAR | Status: AC
  Filled 2018-03-20: qty 10

## 2018-03-20 MED ORDER — ACETAMINOPHEN 160 MG/5ML PO SOLN: 650.0000 mg | Freq: Once | ORAL | Status: AC

## 2018-03-20 MED ORDER — MAGNESIUM SULFATE 4 GM/100ML IV SOLN
4.0000 g | Freq: Once | INTRAVENOUS | Status: AC
  Administered 2018-03-20: 4 g via INTRAVENOUS
  Filled 2018-03-20: qty 100

## 2018-03-20 MED ORDER — THROMBIN 5000 UNITS EX SOLR
CUTANEOUS | Status: AC
  Filled 2018-03-20: qty 5000

## 2018-03-20 MED ORDER — MIDAZOLAM HCL 2 MG/2ML IJ SOLN: 2.0000 mg | INTRAMUSCULAR | Status: DC | PRN

## 2018-03-20 MED ORDER — METOPROLOL TARTRATE 12.5 MG HALF TABLET: 12.5000 mg | ORAL_TABLET | Freq: Two times a day (BID) | ORAL | Status: DC

## 2018-03-20 MED ORDER — LACTATED RINGERS IV SOLN: INTRAVENOUS | Status: DC

## 2018-03-20 MED ORDER — ONDANSETRON HCL 4 MG/2ML IJ SOLN: 4.0000 mg | Freq: Four times a day (QID) | INTRAMUSCULAR | Status: DC | PRN

## 2018-03-20 MED ORDER — LACTATED RINGERS IV SOLN
INTRAVENOUS | Status: DC | PRN
  Administered 2018-03-20 (×2): via INTRAVENOUS

## 2018-03-20 MED ORDER — THROMBIN (RECOMBINANT) 5000 UNITS EX SOLR
CUTANEOUS | Status: AC
  Filled 2018-03-20: qty 10000

## 2018-03-20 MED ORDER — ARTIFICIAL TEARS OPHTHALMIC OINT
TOPICAL_OINTMENT | OPHTHALMIC | Status: AC
  Filled 2018-03-20: qty 3.5

## 2018-03-20 MED ORDER — POTASSIUM CHLORIDE 10 MEQ/50ML IV SOLN
10.0000 meq | INTRAVENOUS | Status: AC
  Administered 2018-03-20 (×3): 10 meq via INTRAVENOUS

## 2018-03-20 MED ORDER — SODIUM CHLORIDE 0.9 % IV SOLN
INTRAVENOUS | Status: DC
  Administered 2018-03-20: 15:00:00 via INTRAVENOUS

## 2018-03-20 MED ORDER — HEPARIN SODIUM (PORCINE) 1000 UNIT/ML IJ SOLN
INTRAMUSCULAR | Status: AC
  Filled 2018-03-20: qty 1

## 2018-03-20 MED ORDER — SODIUM CHLORIDE 0.9% FLUSH
3.0000 mL | Freq: Two times a day (BID) | INTRAVENOUS | Status: DC
  Administered 2018-03-21 – 2018-03-22 (×3): 3 mL via INTRAVENOUS

## 2018-03-20 MED ORDER — PANTOPRAZOLE SODIUM 40 MG PO TBEC
40.0000 mg | DELAYED_RELEASE_TABLET | Freq: Every day | ORAL | Status: DC
  Administered 2018-03-22: 40 mg via ORAL
  Filled 2018-03-20: qty 1

## 2018-03-20 MED ORDER — ORAL CARE MOUTH RINSE
15.0000 mL | Freq: Two times a day (BID) | OROMUCOSAL | Status: DC
  Administered 2018-03-21 – 2018-03-22 (×3): 15 mL via OROMUCOSAL

## 2018-03-20 MED ORDER — MIDAZOLAM HCL 5 MG/5ML IJ SOLN
INTRAMUSCULAR | Status: DC | PRN
  Administered 2018-03-20 (×2): 2 mg via INTRAVENOUS
  Administered 2018-03-20: 3 mg via INTRAVENOUS
  Administered 2018-03-20: 1 mg via INTRAVENOUS
  Administered 2018-03-20: 2 mg via INTRAVENOUS

## 2018-03-20 MED ORDER — ACETAMINOPHEN 650 MG RE SUPP
650.0000 mg | Freq: Once | RECTAL | Status: AC
  Administered 2018-03-20: 650 mg via RECTAL

## 2018-03-20 MED ORDER — EPHEDRINE 5 MG/ML INJ
INTRAVENOUS | Status: AC
  Filled 2018-03-20: qty 10

## 2018-03-20 MED ORDER — LACTATED RINGERS IV SOLN
INTRAVENOUS | Status: DC | PRN
  Administered 2018-03-20: 08:00:00 via INTRAVENOUS

## 2018-03-20 MED ORDER — PHENYLEPHRINE 40 MCG/ML (10ML) SYRINGE FOR IV PUSH (FOR BLOOD PRESSURE SUPPORT)
PREFILLED_SYRINGE | INTRAVENOUS | Status: AC
  Filled 2018-03-20: qty 10

## 2018-03-20 MED ORDER — INSULIN REGULAR BOLUS VIA INFUSION
0.0000 [IU] | Freq: Three times a day (TID) | INTRAVENOUS | Status: DC
  Filled 2018-03-20: qty 10

## 2018-03-20 MED ORDER — METOPROLOL TARTRATE 12.5 MG HALF TABLET: 12.5000 mg | ORAL_TABLET | Freq: Once | ORAL | Status: DC

## 2018-03-20 MED ORDER — HEPARIN SODIUM (PORCINE) 1000 UNIT/ML IJ SOLN
INTRAMUSCULAR | Status: DC | PRN
  Administered 2018-03-20: 54000 [IU] via INTRAVENOUS

## 2018-03-20 MED ORDER — PLASMA-LYTE 148 IV SOLN
INTRAVENOUS | Status: DC | PRN
  Administered 2018-03-20: 500 mL via INTRAVASCULAR

## 2018-03-20 MED ORDER — NITROGLYCERIN 0.2 MG/ML ON CALL CATH LAB
INTRAVENOUS | Status: DC | PRN
  Administered 2018-03-20: 60 ug via INTRAVENOUS
  Administered 2018-03-20 (×7): 20 ug via INTRAVENOUS
  Administered 2018-03-20: 60 ug via INTRAVENOUS
  Administered 2018-03-20: 20 ug via INTRAVENOUS

## 2018-03-20 MED ORDER — BISACODYL 5 MG PO TBEC
10.0000 mg | DELAYED_RELEASE_TABLET | Freq: Every day | ORAL | Status: DC
  Administered 2018-03-21 – 2018-03-22 (×2): 10 mg via ORAL
  Filled 2018-03-20 (×2): qty 2

## 2018-03-20 MED ORDER — ALBUMIN HUMAN 5 % IV SOLN
250.0000 mL | INTRAVENOUS | Status: DC | PRN
  Administered 2018-03-20: 12.5 g via INTRAVENOUS

## 2018-03-20 MED ORDER — PROTAMINE SULFATE 10 MG/ML IV SOLN
INTRAVENOUS | Status: DC | PRN
  Administered 2018-03-20 (×4): 50 mg via INTRAVENOUS
  Administered 2018-03-20: 20 mg via INTRAVENOUS
  Administered 2018-03-20 (×4): 50 mg via INTRAVENOUS
  Administered 2018-03-20: 20 mg via INTRAVENOUS
  Administered 2018-03-20 (×2): 50 mg via INTRAVENOUS

## 2018-03-20 MED ORDER — CHLORHEXIDINE GLUCONATE 0.12 % MT SOLN
OROMUCOSAL | Status: AC
  Administered 2018-03-20: 15 mL
  Filled 2018-03-20: qty 15

## 2018-03-20 MED ORDER — VECURONIUM BROMIDE 10 MG IV SOLR
INTRAVENOUS | Status: AC
  Filled 2018-03-20: qty 10

## 2018-03-20 MED ORDER — HEPARIN SODIUM (PORCINE) 1000 UNIT/ML IJ SOLN
INTRAMUSCULAR | Status: AC
  Filled 2018-03-20: qty 2

## 2018-03-20 MED ORDER — LACTATED RINGERS IV SOLN: 500.0000 mL | Freq: Once | INTRAVENOUS | Status: DC | PRN

## 2018-03-20 MED ORDER — LIDOCAINE 2% (20 MG/ML) 5 ML SYRINGE
INTRAMUSCULAR | Status: DC | PRN
  Administered 2018-03-20: 80 mg via INTRAVENOUS

## 2018-03-20 MED ORDER — VECURONIUM BROMIDE 10 MG IV SOLR
INTRAVENOUS | Status: DC | PRN
  Administered 2018-03-20 (×6): 5 mg via INTRAVENOUS

## 2018-03-20 MED ORDER — PROPOFOL 10 MG/ML IV BOLUS
INTRAVENOUS | Status: DC | PRN
  Administered 2018-03-20: 50 mg via INTRAVENOUS
  Administered 2018-03-20: 120 mg via INTRAVENOUS

## 2018-03-20 MED ORDER — CHLORHEXIDINE GLUCONATE 4 % EX LIQD: 30.0000 mL | CUTANEOUS | Status: DC

## 2018-03-20 MED ORDER — NITROGLYCERIN IN D5W 200-5 MCG/ML-% IV SOLN: 0.0000 ug/min | INTRAVENOUS | Status: DC

## 2018-03-20 MED ORDER — PHENYLEPHRINE HCL-NACL 20-0.9 MG/250ML-% IV SOLN
0.0000 ug/min | INTRAVENOUS | Status: DC
  Administered 2018-03-20: 10 ug/min via INTRAVENOUS
  Administered 2018-03-20: 5 ug/min via INTRAVENOUS
  Filled 2018-03-20: qty 250

## 2018-03-20 MED ORDER — GLYCOPYRROLATE 0.2 MG/ML IJ SOLN
INTRAMUSCULAR | Status: DC | PRN
  Administered 2018-03-20: 0.2 mg via INTRAVENOUS

## 2018-03-20 MED ORDER — ALBUMIN HUMAN 5 % IV SOLN
INTRAVENOUS | Status: DC | PRN
  Administered 2018-03-20 (×2): via INTRAVENOUS

## 2018-03-20 MED ORDER — METOPROLOL TARTRATE 5 MG/5ML IV SOLN: 2.5000 mg | INTRAVENOUS | Status: DC | PRN

## 2018-03-20 MED ORDER — FAMOTIDINE IN NACL 20-0.9 MG/50ML-% IV SOLN
20.0000 mg | Freq: Two times a day (BID) | INTRAVENOUS | Status: AC
  Administered 2018-03-20 (×2): 20 mg via INTRAVENOUS
  Filled 2018-03-20: qty 50

## 2018-03-20 MED ORDER — MORPHINE SULFATE (PF) 2 MG/ML IV SOLN
1.0000 mg | INTRAVENOUS | Status: DC | PRN
  Administered 2018-03-20 (×3): 4 mg via INTRAVENOUS
  Administered 2018-03-21: 2 mg via INTRAVENOUS
  Filled 2018-03-20 (×2): qty 2
  Filled 2018-03-20: qty 1
  Filled 2018-03-20: qty 2

## 2018-03-20 MED ORDER — INSULIN REGULAR(HUMAN) IN NACL 100-0.9 UT/100ML-% IV SOLN
INTRAVENOUS | Status: DC
  Administered 2018-03-20: 5 [IU]/h via INTRAVENOUS
  Filled 2018-03-20 (×2): qty 100

## 2018-03-20 MED ORDER — LIDOCAINE 2% (20 MG/ML) 5 ML SYRINGE
INTRAMUSCULAR | Status: AC
  Filled 2018-03-20: qty 5

## 2018-03-20 MED ORDER — EPHEDRINE SULFATE 50 MG/ML IJ SOLN
INTRAMUSCULAR | Status: DC | PRN
  Administered 2018-03-20: 5 mg via INTRAVENOUS
  Administered 2018-03-20 (×5): 10 mg via INTRAVENOUS

## 2018-03-20 MED ORDER — THROMBIN 5000 UNITS EX SOLR
OROMUCOSAL | Status: DC | PRN
  Administered 2018-03-20 (×2): 5 mL via TOPICAL

## 2018-03-20 MED ORDER — ACETAMINOPHEN 160 MG/5ML PO SOLN: 1000.0000 mg | Freq: Four times a day (QID) | ORAL | Status: DC

## 2018-03-20 MED ORDER — PHENYLEPHRINE HCL 10 MG/ML IJ SOLN
INTRAMUSCULAR | Status: DC | PRN
  Administered 2018-03-20: 80 ug via INTRAVENOUS
  Administered 2018-03-20: 40 ug via INTRAVENOUS
  Administered 2018-03-20: 80 ug via INTRAVENOUS

## 2018-03-20 MED ORDER — SUCCINYLCHOLINE CHLORIDE 20 MG/ML IJ SOLN
INTRAMUSCULAR | Status: DC | PRN
  Administered 2018-03-20: 140 mg via INTRAVENOUS

## 2018-03-20 MED ORDER — SODIUM CHLORIDE 0.9 % IV SOLN
1.5000 g | Freq: Two times a day (BID) | INTRAVENOUS | Status: AC
  Administered 2018-03-20 – 2018-03-22 (×4): 1.5 g via INTRAVENOUS
  Filled 2018-03-20 (×4): qty 1.5

## 2018-03-20 MED ORDER — FENTANYL CITRATE (PF) 250 MCG/5ML IJ SOLN
INTRAMUSCULAR | Status: AC
  Filled 2018-03-20: qty 30

## 2018-03-20 MED ORDER — PROPOFOL 10 MG/ML IV BOLUS
INTRAVENOUS | Status: AC
  Filled 2018-03-20: qty 20

## 2018-03-20 MED ORDER — DEXMEDETOMIDINE HCL IN NACL 200 MCG/50ML IV SOLN
0.0000 ug/kg/h | INTRAVENOUS | Status: DC
  Administered 2018-03-20: 0.2 ug/kg/h via INTRAVENOUS
  Filled 2018-03-20 (×2): qty 50

## 2018-03-20 MED ORDER — VANCOMYCIN HCL IN DEXTROSE 1-5 GM/200ML-% IV SOLN
1000.0000 mg | Freq: Once | INTRAVENOUS | Status: AC
  Administered 2018-03-20: 1000 mg via INTRAVENOUS
  Filled 2018-03-20: qty 200

## 2018-03-20 MED ORDER — METOPROLOL TARTRATE 25 MG/10 ML ORAL SUSPENSION: 12.5000 mg | Freq: Two times a day (BID) | ORAL | Status: DC

## 2018-03-20 MED ORDER — SUCCINYLCHOLINE CHLORIDE 200 MG/10ML IV SOSY: PREFILLED_SYRINGE | INTRAVENOUS | Status: DC | PRN

## 2018-03-20 MED ORDER — ACETAMINOPHEN 500 MG PO TABS
1000.0000 mg | ORAL_TABLET | Freq: Four times a day (QID) | ORAL | Status: DC
  Administered 2018-03-21 – 2018-03-22 (×6): 1000 mg via ORAL
  Filled 2018-03-20 (×6): qty 2

## 2018-03-20 MED ORDER — SODIUM CHLORIDE 0.45 % IV SOLN
INTRAVENOUS | Status: DC | PRN
  Administered 2018-03-20: 15:00:00 via INTRAVENOUS

## 2018-03-20 MED ORDER — TRANEXAMIC ACID 1000 MG/10ML IV SOLN
1.5000 mg/kg/h | INTRAVENOUS | Status: DC
  Filled 2018-03-20: qty 25

## 2018-03-20 SURGICAL SUPPLY — 119 items
BAG DECANTER FOR FLEXI CONT (MISCELLANEOUS) ×4 IMPLANT
BANDAGE ACE 4X5 VEL STRL LF (GAUZE/BANDAGES/DRESSINGS) IMPLANT
BANDAGE ACE 6X5 VEL STRL LF (GAUZE/BANDAGES/DRESSINGS) IMPLANT
BASKET HEART (ORDER IN 25'S) (MISCELLANEOUS) ×1
BASKET HEART (ORDER IN 25S) (MISCELLANEOUS) ×3 IMPLANT
BATTERY MAXDRIVER (MISCELLANEOUS) ×4 IMPLANT
BLADE STERNUM SYSTEM 6 (BLADE) ×4 IMPLANT
BLADE SURG 15 STRL LF DISP TIS (BLADE) ×3 IMPLANT
BLADE SURG 15 STRL SS (BLADE) ×1
BNDG GAUZE ELAST 4 BULKY (GAUZE/BANDAGES/DRESSINGS) ×4 IMPLANT
CANISTER SUCT 3000ML PPV (MISCELLANEOUS) ×4 IMPLANT
CANN PRFSN .5XCNCT 15X34-48 (MISCELLANEOUS) ×3
CANNULA ARTERIAL NVNT 3/8 24FR (CANNULA) ×4 IMPLANT
CANNULA EZ GLIDE 8.0 24FR (CANNULA) ×4 IMPLANT
CANNULA PRFSN .5XCNCT 15X34-48 (MISCELLANEOUS) ×3 IMPLANT
CANNULA VEN 2 STAGE (MISCELLANEOUS) ×1
CATH ROBINSON RED A/P 18FR (CATHETERS) ×8 IMPLANT
CATH THORACIC 28FR (CATHETERS) ×4 IMPLANT
CATH THORACIC 36FR (CATHETERS) ×4 IMPLANT
CATH THORACIC 36FR RT ANG (CATHETERS) ×4 IMPLANT
CATH TROCAR 28FR (CATHETERS) IMPLANT
CLIP VESOCCLUDE MED 24/CT (CLIP) IMPLANT
CLIP VESOCCLUDE SM WIDE 24/CT (CLIP) ×12 IMPLANT
COVER MAYO STAND STRL (DRAPES) ×4 IMPLANT
COVER WAND RF STERILE (DRAPES) ×4 IMPLANT
CRADLE DONUT ADULT HEAD (MISCELLANEOUS) ×4 IMPLANT
DRAPE CARDIOVASCULAR INCISE (DRAPES) ×1
DRAPE HALF SHEET 40X57 (DRAPES) ×8 IMPLANT
DRAPE SLUSH/WARMER DISC (DRAPES) ×4 IMPLANT
DRAPE SRG 135X102X78XABS (DRAPES) ×3 IMPLANT
DRSG COVADERM 4X14 (GAUZE/BANDAGES/DRESSINGS) ×4 IMPLANT
ELECT BLADE 4.0 EZ CLEAN MEGAD (MISCELLANEOUS) ×4
ELECT CAUTERY BLADE 6.4 (BLADE) ×4 IMPLANT
ELECT REM PT RETURN 9FT ADLT (ELECTROSURGICAL) ×8
ELECTRODE BLDE 4.0 EZ CLN MEGD (MISCELLANEOUS) ×3 IMPLANT
ELECTRODE REM PT RTRN 9FT ADLT (ELECTROSURGICAL) ×6 IMPLANT
FELT TEFLON 1X6 (MISCELLANEOUS) ×8 IMPLANT
GAUZE SPONGE 4X4 12PLY STRL (GAUZE/BANDAGES/DRESSINGS) ×4 IMPLANT
GAUZE SPONGE 4X4 12PLY STRL LF (GAUZE/BANDAGES/DRESSINGS) ×4 IMPLANT
GEL ULTRASOUND 20GR AQUASONIC (MISCELLANEOUS) ×4 IMPLANT
GLOVE BIO SURGEON STRL SZ 6 (GLOVE) ×8 IMPLANT
GLOVE BIO SURGEON STRL SZ 6.5 (GLOVE) ×40 IMPLANT
GLOVE BIO SURGEON STRL SZ7 (GLOVE) IMPLANT
GLOVE BIO SURGEON STRL SZ7.5 (GLOVE) IMPLANT
GLOVE BIOGEL PI IND STRL 6 (GLOVE) IMPLANT
GLOVE BIOGEL PI IND STRL 6.5 (GLOVE) IMPLANT
GLOVE BIOGEL PI IND STRL 7.0 (GLOVE) IMPLANT
GLOVE BIOGEL PI INDICATOR 6 (GLOVE)
GLOVE BIOGEL PI INDICATOR 6.5 (GLOVE)
GLOVE BIOGEL PI INDICATOR 7.0 (GLOVE)
GLOVE EUDERMIC 7 POWDERFREE (GLOVE) ×8 IMPLANT
GLOVE ORTHO TXT STRL SZ7.5 (GLOVE) IMPLANT
GLOVE SURG SS PI 6.0 STRL IVOR (GLOVE) ×4 IMPLANT
GOWN STRL REUS W/ TWL LRG LVL3 (GOWN DISPOSABLE) ×21 IMPLANT
GOWN STRL REUS W/ TWL XL LVL3 (GOWN DISPOSABLE) ×3 IMPLANT
GOWN STRL REUS W/TWL LRG LVL3 (GOWN DISPOSABLE) ×7
GOWN STRL REUS W/TWL XL LVL3 (GOWN DISPOSABLE) ×1
HARMONIC SHEARS 14CM COAG (MISCELLANEOUS) IMPLANT
HEMOSTAT POWDER SURGIFOAM 1G (HEMOSTASIS) ×8 IMPLANT
HEMOSTAT SURGICEL 2X14 (HEMOSTASIS) ×4 IMPLANT
INSERT FOGARTY 61MM (MISCELLANEOUS) IMPLANT
INSERT FOGARTY XLG (MISCELLANEOUS) ×4 IMPLANT
KIT BASIN OR (CUSTOM PROCEDURE TRAY) ×4 IMPLANT
KIT CATH CPB BARTLE (MISCELLANEOUS) ×4 IMPLANT
KIT SUCTION CATH 14FR (SUCTIONS) ×4 IMPLANT
KIT TURNOVER KIT B (KITS) ×4 IMPLANT
KIT VASOVIEW HEMOPRO 2 VH 4000 (KITS) ×4 IMPLANT
NS IRRIG 1000ML POUR BTL (IV SOLUTION) ×20 IMPLANT
PACK E OPEN HEART (SUTURE) ×4 IMPLANT
PACK OPEN HEART (CUSTOM PROCEDURE TRAY) ×4 IMPLANT
PAD ARMBOARD 7.5X6 YLW CONV (MISCELLANEOUS) ×8 IMPLANT
PAD ELECT DEFIB RADIOL ZOLL (MISCELLANEOUS) ×4 IMPLANT
PENCIL BUTTON HOLSTER BLD 10FT (ELECTRODE) ×4 IMPLANT
PLATE BONE LOCK TI 1.8 ST 8H (Plate) ×4 IMPLANT
PUNCH AORTIC ROTATE 4.0MM (MISCELLANEOUS) ×4 IMPLANT
PUNCH AORTIC ROTATE 4.5MM 8IN (MISCELLANEOUS) ×4 IMPLANT
PUNCH AORTIC ROTATE 5MM 8IN (MISCELLANEOUS) IMPLANT
SCREW LOCKING TI 2.3X13MM (Screw) ×24 IMPLANT
SET CARDIOPLEGIA MPS 5001102 (MISCELLANEOUS) ×4 IMPLANT
SHEARS HARMONIC 9CM CVD (BLADE) ×4 IMPLANT
SPONGE INTESTINAL PEANUT (DISPOSABLE) IMPLANT
SPONGE LAP 18X18 X RAY DECT (DISPOSABLE) IMPLANT
SPONGE LAP 4X18 RFD (DISPOSABLE) ×8 IMPLANT
SUT BONE WAX W31G (SUTURE) ×4 IMPLANT
SUT MNCRL AB 4-0 PS2 18 (SUTURE) IMPLANT
SUT PROLENE 3 0 SH DA (SUTURE) IMPLANT
SUT PROLENE 3 0 SH1 36 (SUTURE) ×4 IMPLANT
SUT PROLENE 4 0 RB 1 (SUTURE)
SUT PROLENE 4 0 SH DA (SUTURE) IMPLANT
SUT PROLENE 4-0 RB1 .5 CRCL 36 (SUTURE) IMPLANT
SUT PROLENE 5 0 C 1 36 (SUTURE) IMPLANT
SUT PROLENE 6 0 C 1 30 (SUTURE) IMPLANT
SUT PROLENE 7 0 BV 1 (SUTURE) IMPLANT
SUT PROLENE 7 0 BV1 MDA (SUTURE) ×4 IMPLANT
SUT PROLENE 8 0 BV175 6 (SUTURE) ×8 IMPLANT
SUT SILK  1 MH (SUTURE) ×1
SUT SILK 1 MH (SUTURE) ×3 IMPLANT
SUT SILK 2 0 SH (SUTURE) ×4 IMPLANT
SUT STEEL STERNAL CCS#1 18IN (SUTURE) IMPLANT
SUT STEEL SZ 6 DBL 3X14 BALL (SUTURE) ×12 IMPLANT
SUT VIC AB 1 CTX 36 (SUTURE) ×2
SUT VIC AB 1 CTX36XBRD ANBCTR (SUTURE) ×6 IMPLANT
SUT VIC AB 2-0 CT1 27 (SUTURE)
SUT VIC AB 2-0 CT1 TAPERPNT 27 (SUTURE) IMPLANT
SUT VIC AB 2-0 CTX 27 (SUTURE) IMPLANT
SUT VIC AB 3-0 SH 27 (SUTURE)
SUT VIC AB 3-0 SH 27X BRD (SUTURE) IMPLANT
SUT VIC AB 3-0 X1 27 (SUTURE) ×4 IMPLANT
SUT VICRYL 4-0 PS2 18IN ABS (SUTURE) IMPLANT
SYR 50ML SLIP (SYRINGE) ×4 IMPLANT
SYSTEM SAHARA CHEST DRAIN ATS (WOUND CARE) ×4 IMPLANT
TAPE CLOTH SURG 4X10 WHT LF (GAUZE/BANDAGES/DRESSINGS) ×4 IMPLANT
TAPE PAPER 2X10 WHT MICROPORE (GAUZE/BANDAGES/DRESSINGS) ×4 IMPLANT
TOWEL GREEN STERILE (TOWEL DISPOSABLE) ×4 IMPLANT
TOWEL GREEN STERILE FF (TOWEL DISPOSABLE) ×4 IMPLANT
TRAY FOLEY SLVR 16FR TEMP STAT (SET/KITS/TRAYS/PACK) ×4 IMPLANT
TUBING INSUFFLATION (TUBING) ×4 IMPLANT
UNDERPAD 30X30 (UNDERPADS AND DIAPERS) ×4 IMPLANT
WATER STERILE IRR 1000ML POUR (IV SOLUTION) ×8 IMPLANT

## 2018-03-20 NOTE — Progress Notes (Signed)
Patient ID: Garrett Welch, male   DOB: [birth year], 67 y.o.   MRN: [id number] EVENING ROUNDS NOTE :     [address]       ,[place] [address]             [phone]                 Day of Surgery Procedure(s) (LRB): CORONARY ARTERY BYPASS GRAFTING (CABG)  times two using bilateral internal mammary arteries. Plating of sternal fracture. (N/A) possible RADIAL ARTERY HARVEST (N/A) TRANSESOPHAGEAL ECHOCARDIOGRAM (TEE) (N/A)  Total Length of Stay:  LOS: 0 days  BP (!) 130/59   Pulse 80   Temp 99.1 F (37.3 C)   Resp (!) 23   SpO2 99%   .Intake/Output      11/24 0701 - 11/25 0700 11/25 0701 - 11/26 0700   I.V.  4318.8   Blood  900   IV Piggyback  1186.3   Total Intake  6405.1   Urine  1455   Blood  1100   Chest Tube  390   Total Output  2945   Net  +3460.1          . sodium chloride Stopped (03/20/18 1723)  . [START ON 03/21/2018] sodium chloride    . sodium chloride 10 mL/hr at 03/20/18 1459  . albumin human 12.5 g (03/20/18 1505)  . cefUROXime (ZINACEF)  IV Stopped (03/20/18 1710)  . dexmedetomidine (PRECEDEX) IV infusion Stopped (03/20/18 1701)  . famotidine (PEPCID) IV Stopped (03/20/18 1506)  . insulin 3 mL/hr at 03/20/18 1800  . lactated ringers    . lactated ringers Stopped (03/20/18 1549)  . lactated ringers 20 mL/hr at 03/20/18 1800  . magnesium sulfate 20 mL/hr at 03/20/18 1800  . nitroGLYCERIN Stopped (03/20/18 1607)  . phenylephrine (NEO-SYNEPHRINE) Adult infusion 5 mcg/min (03/20/18 1800)  . vancomycin       Lab Results  Component Value Date   WBC 11.8 (H) 03/20/2018   HGB 11.2 (L) 03/20/2018   HCT 34.3 (L) 03/20/2018   PLT 139 (L) 03/20/2018   GLUCOSE 114 (H) 03/20/2018   CHOL 141 06/05/2015   TRIG 65 06/05/2015   HDL 47 06/05/2015   LDLCALC 81 06/05/2015   ALT 20 03/16/2018   AST 20 03/16/2018   NA 142 03/20/2018   K 3.9 03/20/2018   CL 102 03/20/2018   CREATININE 0.50 (L) 03/20/2018   BUN 12 03/20/2018   CO2 18 (L) 03/16/2018   TSH 1.622 09/16/2017   INR 1.51 03/20/2018   HGBA1C 6.4 (H) 03/16/2018   Extubated, stood up Stable early postop   Grace Merwyn MD  Beeper [phone] Office [phone] 03/20/2018 6:46 PM

## 2018-03-20 NOTE — Op Note (Signed)
CARDIOVASCULAR SURGERY OPERATIVE NOTE  03/20/2018  Surgeon:  [name], MD  First Assistant: Nicholes Rough,  PA-C   Preoperative Diagnosis:  Severe multi-vessel coronary artery disease   Postoperative Diagnosis:  Same   Procedure:  1. Median Sternotomy 2. Extracorporeal circulation 3.   Coronary artery bypass grafting x 2   Left internal mammary artery graft to the LAD  Free right internal mammary artery graft to the diagonal  4.   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 His left ventricular function was normal and so he developed atrial flutter with rapid ventricular response and was shown to have an ejection fraction of 30%. He underwent atrial flutter ablation with restoration of sinus rhythm and his ejection fraction increased to 50 to 55% by echocardiogram on 12/16/2017. He underwent an exercise stress test on 01/09/2018 which showed an ejection fraction of 41%. There were no ST segment changes during stress. There is a medium size defect of moderate severity in the apical anterior, apical septal, apical inferior, apical lateral, and apical location findings consistent with ischemia. He was seen in follow-up by Dr. Burt Knack on 02/10/2018. He underwent cardiac catheterization on 02/22/2018 which showed 40 to 50% distal left  main stenosis. There is chronic total occlusion of the proximal LAD with right to left collaterals supplying the mid and distal LAD. There is subtotal occlusion of the first diagonal branch. There is moderate stenosis in the large ramus branch of 60%. The right coronary was a large dominant vessel with mild nonobstructive disease. Left ventricular ejection fraction was 50 to 55%.  I think coronary bypass graft surgery is the best treatment to preserve his viable myocardium in the anterior wall and septum and to try to improve his exertional fatigue. I discussed the option of coronary bypass graft surgery as well as the option of continued medical treatment. I reviewed the cardiac catheterization films with the patient and his wife and answered their questions. They would like to proceed with surgical treatment. He will not have any suitable saphenous vein since he had bilateral saphenous vein ablation with laser. His upper extremity arterial dopplers show that the palmar arch signal is obliterated with ulnar and radial compression so we can not use either radial artery. I will plan on using both left and right internal mammary arteries. I discussed the operative procedure with the patient and his wife including alternatives, benefits and risks; including but not limited to bleeding, blood transfusion, infection, stroke, myocardial infarction, graft failure, heart block requiring a permanent pacemaker, organ dysfunction, and death. Lanora Manis III understands and agrees to proceed.   Preparation:  The patient was seen in the preoperative holding area and the correct patient, correct operation were confirmed with the patient after reviewing the medical record and catheterization. The consent was signed by me. Preoperative antibiotics were given. A pulmonary arterial line and radial arterial line were placed by the anesthesia team. The patient was taken back to the operating room and positioned supine  on the operating  room table. After being placed under general endotracheal anesthesia by the anesthesia team a foley catheter was placed. The neck, chest, abdomen, and both legs were prepped with betadine soap and solution and draped in the usual sterile manner. A surgical time-out was taken and the correct patient and operative procedure were confirmed with the nursing and anesthesia staff.   Cardiopulmonary Bypass:  A median sternotomy was performed. The pericardium was opened in the midline. Right ventricular function appeared normal. The ascending aorta was of normal size and had no palpable plaque. There were no contraindications to aortic cannulation or cross-clamping. The patient was fully systemically heparinized and the ACT was maintained > 400 sec. The proximal aortic arch was cannulated with a 24 F aortic cannula for arterial inflow. Venous cannulation was performed via the right atrial appendage using a large two-staged venous cannula. An antegrade cardioplegia/vent cannula was inserted into the mid-ascending aorta. Aortic occlusion was performed with a single cross-clamp. Systemic cooling to 32 degrees Centigrade and topical cooling of the heart with iced saline were used. Hyperkalemic antegrade cold blood cardioplegia was used to induce diastolic arrest and was then given at about 20 minute intervals throughout the period of arrest to maintain myocardial temperature at or below 10 degrees centigrade. A temperature probe was inserted into the interventricular septum and an insulating pad was placed in the pericardium.   Left internal mammary artery harvest:  The left side of the sternum was retracted using the Rultract retractor. The left internal mammary artery was harvested as a pedicle graft. All side branches were clipped. It was a large-sized vessel of good quality with excellent blood flow. It was ligated distally and divided. It was sprayed with topical papaverine solution to prevent  vasospasm.   Right internal mammary artery harvest:  The right side of the sternum was retracted using the Rultract retractor. The right internal mammary artery was harvested as a free graft. All side branches were clipped. It was a large-sized vessel of good quality with excellent blood flow. It was ligated distally and divided. It was sprayed with topical papaverine solution to prevent vasospasm. Prior to use it was ligated proximally with a 2-0 silk suture ligature and divided.    Coronary arteries:  The coronary arteries were examined.   LAD:  Large vessel with no distal disease. The diagonal was also large with no distal disease and extended almost to the apex.  LCX:  Ramus was moderate caliber but diffusely diseased. Since it only had 60% stenosis on cath I decided to place the RIMA graft to the diagonal which was a much better vessel that was sub-totally occluded.   RCA:  No distal disease.    Grafts:  1. LIMA to the LAD: 2.5 mm. It was sewn end to side using 8-0 prolene continuous suture. 2. Free RIMA to the diagonal:  2.5 mm. It was sewn end to side using 8-0 prolene continuous suture.    The proximal RIMA graft anastomosis was performed to the mid-ascending aorta using continuous 7-0 prolene suture. A graft marker was placed around the proximal anastomosis.   Completion:  The patient was rewarmed to 37 degrees Centigrade. The clamp was removed from the LIMA pedicle and there was rapid warming of the septum and return of ventricular fibrillation. The crossclamp was removed with a time of 62 minutes. There was spontaneous return of sinus rhythm. The distal and proximal anastomoses were checked for hemostasis. The position of the grafts was satisfactory. Two temporary epicardial pacing  wires were placed on the right atrium and two on the right ventricle. The patient was weaned from CPB without difficulty on no inotropes. CPB time was 86 minutes. Cardiac output was 8 LPM. TEE  showed normal LV systolic function. Heparin was fully reversed with protamine and the aortic and venous cannulas removed. Hemostasis was achieved. Mediastinal and left pleural drainage tubes were placed.    Sternal plating and closure:  The left side of the sternum sustained a transverse fracture at the lower end from retracting to expose the LIMA during harvest. This was repaired using a KLS Martin locking bone plate Tl 1.8 ST 8H with 6 2.3 x 13 mm locking screws. The sternum was closed with double #6 stainless steel wires which were placed lateral to the plate inferiorly. The fascia was closed with continuous # 1 vicryl suture. The subcutaneous tissue was closed with 2-0 vicryl continuous suture. The skin was closed with 3-0 vicryl subcuticular suture. All sponge, needle, and instrument counts were reported correct at the end of the case. Dry sterile dressings were placed over the incisions and around the chest tubes which were connected to pleurevac suction. The patient was then transported to the surgical intensive care unit in stable condition.

## 2018-03-20 NOTE — Progress Notes (Signed)
  Echocardiogram Echocardiogram Transesophageal has been performed.  Darlina Sicilian M 03/20/2018, 8:18 AM

## 2018-03-20 NOTE — Anesthesia Postprocedure Evaluation (Signed)
Anesthesia Post Note  Patient: Constant Mandeville III  Procedure(s) Performed: CORONARY ARTERY BYPASS GRAFTING (CABG)  times two using bilateral internal mammary arteries. Plating of sternal fracture. (N/A Chest) possible RADIAL ARTERY HARVEST (N/A Arm Lower) TRANSESOPHAGEAL ECHOCARDIOGRAM (TEE) (N/A )     Patient location during evaluation: SICU Anesthesia Type: General Level of consciousness: patient remains intubated per anesthesia plan Pain management: pain level controlled Vital Signs Assessment: post-procedure vital signs reviewed and stable Respiratory status: patient remains intubated per anesthesia plan Cardiovascular status: stable Postop Assessment: no apparent nausea or vomiting Anesthetic complications: no    Last Vitals:  Vitals:   03/20/18 1641 03/20/18 1710  BP: (!) 85/58   Pulse: 80   Resp: 17   Temp: 36.9 C   SpO2: 99% 100%    Last Pain:  Vitals:   03/20/18 1600  TempSrc: Core (Comment)                 Gerald Kuehl

## 2018-03-20 NOTE — Anesthesia Procedure Notes (Signed)
Arterial Line Insertion Start/End11/25/2019 7:00 AM, 03/20/2018 7:10 AM Performed by: Jearld Pies, CRNA, CRNA  Patient location: Pre-op. Preanesthetic checklist: patient identified, IV checked, site marked, risks and benefits discussed, surgical consent, monitors and equipment checked, pre-op evaluation, timeout performed and anesthesia consent Lidocaine 1% used for infiltration and patient sedated Right, radial was placed Catheter size: 20 G Hand hygiene performed , maximum sterile barriers used  and Seldinger technique used Allen's test indicative of satisfactory collateral circulation Attempts: 1 Procedure performed without using ultrasound guided technique. Following insertion, dressing applied and Biopatch. Post procedure assessment: normal  Patient tolerated the procedure well with no immediate complications.

## 2018-03-20 NOTE — Anesthesia Procedure Notes (Signed)
Procedure Name: Intubation Date/Time: 03/20/2018 7:56 AM Performed by: Jearld Pies, CRNA Pre-anesthesia Checklist: Patient identified, Emergency Drugs available, Suction available and Patient being monitored Patient Re-evaluated:Patient Re-evaluated prior to induction Oxygen Delivery Method: Circle System Utilized Preoxygenation: Pre-oxygenation with 100% oxygen Induction Type: IV induction Laryngoscope Size: Mac and 4 Grade View: Grade I Tube type: Oral Tube size: 8.0 mm Number of attempts: 1 Airway Equipment and Method: Stylet and Oral airway Placement Confirmation: ETT inserted through vocal cords under direct vision,  positive ETCO2 and breath sounds checked- equal and bilateral Secured at: 23 cm Tube secured with: Tape Dental Injury: Teeth and Oropharynx as per pre-operative assessment

## 2018-03-20 NOTE — Interval H&P Note (Signed)
History and Physical Interval Note:  03/20/2018 7:34 AM  Garrett Welch  has presented today for surgery, with the diagnosis of CAD  The various methods of treatment have been discussed with the patient and family. After consideration of risks, benefits and other options for treatment, the patient has consented to  Procedure(s) with comments: CORONARY ARTERY BYPASS GRAFTING (CABG) (N/A) - with bilateral IMA possible RADIAL ARTERY HARVEST (N/A) TRANSESOPHAGEAL ECHOCARDIOGRAM (TEE) (N/A) as a surgical intervention .  The patient's history has been reviewed, patient examined, no change in status, stable for surgery.  I have reviewed the patient's chart and labs.  Questions were answered to the patient's satisfaction.     [name]

## 2018-03-20 NOTE — Procedures (Signed)
Extubation Procedure Note  Patient Details:   Name: Garrett Welch DOB: 1950/05/22 MRN: [id number]   Airway Documentation:    Vent end date: 03/20/18 Vent end time: 1710   Evaluation  O2 sats: stable throughout Complications: No apparent complications Patient did tolerate procedure well. Bilateral Breath Sounds: Clear   Yes   Patient extubated per protocol to 4L [place] with no apparent complications. Positive cuff leak was noted prior to extubation. Patient achieved NIF of -34 and VC of 1.1L. Patient is alert and oriented and is able to speak. Vitals are stable. RT will continue to monitor.   [name] [name] 03/20/2018, 5:20 PM

## 2018-03-20 NOTE — Anesthesia Preprocedure Evaluation (Signed)
Anesthesia Evaluation  Patient identified by MRN, date of birth, ID band Patient awake    Reviewed: Allergy & Precautions, NPO status , Patient's Chart, lab work & pertinent test results  Airway Mallampati: II  TM Distance: >3 FB     Dental   Pulmonary sleep apnea , pneumonia,    breath sounds clear to auscultation       Cardiovascular hypertension, + CAD   Rhythm:Regular Rate:Normal     Neuro/Psych    GI/Hepatic negative GI ROS, Neg liver ROS,   Endo/Other  diabetes  Renal/GU negative Renal ROS     Musculoskeletal  (+) Arthritis ,   Abdominal   Peds  Hematology   Anesthesia Other Findings   Reproductive/Obstetrics                             Anesthesia Physical Anesthesia Plan  ASA: III  Anesthesia Plan: General   Post-op Pain Management:    Induction: Intravenous  PONV Risk Score and Plan: Treatment may vary due to age or medical condition and Midazolam  Airway Management Planned: Oral ETT  Additional Equipment: Arterial line, CVP, PA Cath, TEE and Ultrasound Guidance Line Placement  Intra-op Plan:   Post-operative Plan: Post-operative intubation/ventilation  Informed Consent: I have reviewed the patients History and Physical, chart, labs and discussed the procedure including the risks, benefits and alternatives for the proposed anesthesia with the patient or authorized representative who has indicated his/her understanding and acceptance.   Dental advisory given  Plan Discussed with: CRNA and Anesthesiologist  Anesthesia Plan Comments:         Anesthesia Quick Evaluation

## 2018-03-20 NOTE — Anesthesia Procedure Notes (Signed)
Central Venous Catheter Insertion Performed by: Oleta Mouse, MD, anesthesiologist Start/End11/25/2019 6:48 AM, 03/20/2018 6:56 AM Patient location: Pre-op. Preanesthetic checklist: patient identified, IV checked, site marked, risks and benefits discussed, surgical consent, monitors and equipment checked, pre-op evaluation, timeout performed and anesthesia consent Lidocaine 1% used for infiltration and patient sedated Hand hygiene performed  and maximum sterile barriers used  Catheter size: 8.5 Fr Total catheter length 10. Sheath introducer Procedure performed using ultrasound guided technique. Ultrasound Notes:anatomy identified, needle tip was noted to be adjacent to the nerve/plexus identified, no ultrasound evidence of intravascular and/or intraneural injection and image(s) printed for medical record Attempts: 1 Following insertion, line sutured, dressing applied and Biopatch. Post procedure assessment: blood return through all ports, free fluid flow and no air  Patient tolerated the procedure well with no immediate complications.

## 2018-03-20 NOTE — Anesthesia Procedure Notes (Signed)
Central Venous Catheter Insertion Performed by: Oleta Mouse, MD, anesthesiologist Start/End11/25/2019 6:48 AM, 03/20/2018 6:56 AM Patient location: Pre-op. Preanesthetic checklist: patient identified, IV checked, site marked, risks and benefits discussed, surgical consent, monitors and equipment checked, pre-op evaluation, timeout performed and anesthesia consent Hand hygiene performed  and maximum sterile barriers used  PA cath was placed.Swan type:thermodilution Procedure performed without using ultrasound guided technique. Attempts: 1 Patient tolerated the procedure well with no immediate complications.

## 2018-03-20 NOTE — Transfer of Care (Signed)
Immediate Anesthesia Transfer of Care Note  Patient: Garrett Welch  Procedure(s) Performed: CORONARY ARTERY BYPASS GRAFTING (CABG)  times two using bilateral internal mammary arteries. Plating of sternal fracture. (N/A Chest) possible RADIAL ARTERY HARVEST (N/A Arm Lower) TRANSESOPHAGEAL ECHOCARDIOGRAM (TEE) (N/A )  Patient Location: ICU  Anesthesia Type:General  Level of Consciousness: sedated and Patient remains intubated per anesthesia plan  Airway & Oxygen Therapy: Patient remains intubated per anesthesia plan and Patient placed on Ventilator (see vital sign flow sheet for setting)  Post-op Assessment: Report given to RN and Post -op Vital signs reviewed and stable  Post vital signs: Reviewed and stable  Last Vitals:  Vitals Value Taken Time  BP 91/64 NIB (111/38 ABP) 03/20/2018  2:25 PM  Temp 35.7 C 03/20/2018  2:35 PM  Pulse 79 PACED - DDD Rate 80, A 12 mA V 10 mA 03/20/2018  2:35 PM  Resp 12 03/20/2018  2:35 PM  SpO2 98 % 03/20/2018  2:35 PM  Vitals shown include unvalidated device data.  Last Pain: There were no vitals filed for this visit.     Report to [facility] in 2N ICU. VSS. Ventilator attached to ETT via RT, ventilation confirmed on 50% FiO2, SIMV mode, 12/5.  Complications: No apparent anesthesia complications

## 2018-03-20 NOTE — Brief Op Note (Signed)
03/20/2018  10:34 AM  PATIENT:  Garrett Welch  67 y.o. male  PRE-OPERATIVE DIAGNOSIS:  CAD  POST-OPERATIVE DIAGNOSIS:  CAD   PROCEDURE:  Procedure(s) with comments: CORONARY ARTERY BYPASS GRAFTING (CABG)  times two using bilateral internal mammary arteries. Plating of sternal fracture. (N/A) - with bilateral IMA possible RADIAL ARTERY HARVEST (N/A) TRANSESOPHAGEAL ECHOCARDIOGRAM (TEE) (N/A)  LIMA to LAD RIMA to Diag 1   SURGEON:  Surgeon(s) and Role:    * Bartle, Fernande Boyden, MD - Primary  PHYSICIAN ASSISTANT:  Nicholes Rough, PA-C   ANESTHESIA:   general  EBL:  1100 mL   BLOOD ADMINISTERED:none  DRAINS: ROUTINE   LOCAL MEDICATIONS USED:  NONE  SPECIMEN:  No Specimen  DISPOSITION OF SPECIMEN:  N/A  COUNTS:  YES  DICTATION: .Dragon Dictation  PLAN OF CARE: Admit to inpatient   PATIENT DISPOSITION:  ICU - intubated and hemodynamically stable.   Delay start of Pharmacological VTE agent (>24hrs) due to surgical blood loss or risk of bleeding: yes

## 2018-03-21 ENCOUNTER — Encounter (HOSPITAL_COMMUNITY): Payer: Self-pay | Admitting: *Deleted

## 2018-03-21 ENCOUNTER — Inpatient Hospital Stay (HOSPITAL_COMMUNITY): Payer: BLUE CROSS/BLUE SHIELD

## 2018-03-21 ENCOUNTER — Other Ambulatory Visit: Payer: Self-pay

## 2018-03-21 LAB — POCT I-STAT, CHEM 8
BUN: 12 mg/dL (ref 8–23)
CHLORIDE: 100 mmol/L (ref 98–111)
Calcium, Ion: 1.19 mmol/L (ref 1.15–1.40)
Creatinine, Ser: 0.8 mg/dL (ref 0.61–1.24)
GLUCOSE: 122 mg/dL — AB (ref 70–99)
HCT: 35 % — ABNORMAL LOW (ref 39.0–52.0)
Hemoglobin: 11.9 g/dL — ABNORMAL LOW (ref 13.0–17.0)
Potassium: 4 mmol/L (ref 3.5–5.1)
Sodium: 135 mmol/L (ref 135–145)
TCO2: 29 mmol/L (ref 22–32)

## 2018-03-21 LAB — CBC
HEMATOCRIT: 35.7 % — AB (ref 39.0–52.0)
HEMATOCRIT: 38.7 % — AB (ref 39.0–52.0)
HEMOGLOBIN: 11.6 g/dL — AB (ref 13.0–17.0)
Hemoglobin: 12.5 g/dL — ABNORMAL LOW (ref 13.0–17.0)
MCH: 30.6 pg (ref 26.0–34.0)
MCH: 30.8 pg (ref 26.0–34.0)
MCHC: 32.5 g/dL (ref 30.0–36.0)
MCHC: 32.3 g/dL (ref 30.0–36.0)
MCV: 94.2 fL (ref 80.0–100.0)
MCV: 95.3 fL (ref 80.0–100.0)
NRBC: 0 % (ref 0.0–0.2)
NRBC: 0 % (ref 0.0–0.2)
PLATELETS: 181 10*3/uL (ref 150–400)
Platelets: 181 10*3/uL (ref 150–400)
RBC: 3.79 MIL/uL — AB (ref 4.22–5.81)
RBC: 4.06 MIL/uL — AB (ref 4.22–5.81)
RDW: 12.7 % (ref 11.5–15.5)
RDW: 12.7 % (ref 11.5–15.5)
WBC: 13.7 10*3/uL — ABNORMAL HIGH (ref 4.0–10.5)
WBC: 13.7 10*3/uL — ABNORMAL HIGH (ref 4.0–10.5)

## 2018-03-21 LAB — BASIC METABOLIC PANEL
ANION GAP: 6 (ref 5–15)
BUN: 11 mg/dL (ref 8–23)
CO2: 24 mmol/L (ref 22–32)
Calcium: 8.1 mg/dL — ABNORMAL LOW (ref 8.9–10.3)
Chloride: 107 mmol/L (ref 98–111)
Creatinine, Ser: 0.75 mg/dL (ref 0.61–1.24)
GFR calc non Af Amer: >60 mL/min (ref >60)
Glucose, Bld: 112 mg/dL — ABNORMAL HIGH (ref 70–99)
POTASSIUM: 4.1 mmol/L (ref 3.5–5.1)
Sodium: 137 mmol/L (ref 135–145)

## 2018-03-21 LAB — GLUCOSE, CAPILLARY
GLUCOSE-CAPILLARY: 110 mg/dL — AB (ref 70–99)
GLUCOSE-CAPILLARY: 99 mg/dL (ref 70–99)
GLUCOSE-CAPILLARY: 100 mg/dL — AB (ref 70–99)
GLUCOSE-CAPILLARY: 103 mg/dL — AB (ref 70–99)
GLUCOSE-CAPILLARY: 122 mg/dL — AB (ref 70–99)
GLUCOSE-CAPILLARY: 117 mg/dL — AB (ref 70–99)
GLUCOSE-CAPILLARY: 118 mg/dL — AB (ref 70–99)
GLUCOSE-CAPILLARY: 118 mg/dL — AB (ref 70–99)
GLUCOSE-CAPILLARY: 99 mg/dL (ref 70–99)
Glucose-Capillary: 103 mg/dL — ABNORMAL HIGH (ref 70–99)
Glucose-Capillary: 106 mg/dL — ABNORMAL HIGH (ref 70–99)
Glucose-Capillary: 111 mg/dL — ABNORMAL HIGH (ref 70–99)
Glucose-Capillary: 112 mg/dL — ABNORMAL HIGH (ref 70–99)
Glucose-Capillary: 144 mg/dL — ABNORMAL HIGH (ref 70–99)

## 2018-03-21 LAB — CREATININE, SERUM
Creatinine, Ser: 0.89 mg/dL (ref 0.61–1.24)
GFR calc Af Amer: >60 mL/min (ref >60)
GFR calc non Af Amer: >60 mL/min (ref >60)

## 2018-03-21 LAB — MAGNESIUM
Magnesium: 2 mg/dL (ref 1.7–2.4)
Magnesium: 1.9 mg/dL (ref 1.7–2.4)

## 2018-03-21 MED ORDER — INSULIN DETEMIR 100 UNIT/ML ~~LOC~~ SOLN: 30.0000 [IU] | Freq: Every day | SUBCUTANEOUS | Status: DC

## 2018-03-21 MED ORDER — INSULIN DETEMIR 100 UNIT/ML ~~LOC~~ SOLN
30.0000 [IU] | Freq: Every day | SUBCUTANEOUS | Status: DC
  Filled 2018-03-21: qty 0.3

## 2018-03-21 MED ORDER — POTASSIUM CHLORIDE CRYS ER 20 MEQ PO TBCR
20.0000 meq | EXTENDED_RELEASE_TABLET | Freq: Two times a day (BID) | ORAL | Status: AC
  Administered 2018-03-21 (×2): 20 meq via ORAL
  Filled 2018-03-21 (×2): qty 1

## 2018-03-21 MED ORDER — ENOXAPARIN SODIUM 40 MG/0.4ML ~~LOC~~ SOLN
40.0000 mg | Freq: Every day | SUBCUTANEOUS | Status: DC
  Administered 2018-03-21 – 2018-03-23 (×3): 40 mg via SUBCUTANEOUS
  Filled 2018-03-21 (×3): qty 0.4

## 2018-03-21 MED ORDER — FUROSEMIDE 10 MG/ML IJ SOLN
40.0000 mg | Freq: Two times a day (BID) | INTRAMUSCULAR | Status: AC
  Administered 2018-03-21 (×2): 40 mg via INTRAVENOUS
  Filled 2018-03-21 (×2): qty 4

## 2018-03-21 MED ORDER — INSULIN ASPART 100 UNIT/ML ~~LOC~~ SOLN
0.0000 [IU] | SUBCUTANEOUS | Status: DC
  Administered 2018-03-21 – 2018-03-22 (×4): 2 [IU] via SUBCUTANEOUS

## 2018-03-21 MED ORDER — INSULIN DETEMIR 100 UNIT/ML ~~LOC~~ SOLN
30.0000 [IU] | Freq: Every day | SUBCUTANEOUS | Status: DC
  Administered 2018-03-21 – 2018-03-22 (×2): 30 [IU] via SUBCUTANEOUS
  Filled 2018-03-21 (×4): qty 0.3

## 2018-03-21 NOTE — Progress Notes (Signed)
1 Day Post-Op Procedure(s) (LRB): CORONARY ARTERY BYPASS GRAFTING (CABG)  times two using bilateral internal mammary arteries. Plating of sternal fracture. (N/A) possible RADIAL ARTERY HARVEST (N/A) TRANSESOPHAGEAL ECHOCARDIOGRAM (TEE) (N/A) Subjective: No complaints  Objective: Vital signs in last 24 hours: Temp:  [96.3 F (35.7 C)-99.7 F (37.6 C)] 99.1 F (37.3 C) (11/26 0725) Pulse Rate:  [79-82] 79 (11/26 0725) Cardiac Rhythm: Atrial paced (11/26 0400) Resp:  [12-29] 18 (11/26 0725) BP: (85-136)/(40-73) 111/63 (11/26 0725) SpO2:  [95 %-100 %] 96 % (11/26 0725) Arterial Line BP: (88-148)/(38-70) 114/50 (11/26 0725) FiO2 (%):  [36 %-50 %] 36 % (11/25 1712) Weight:  [155 kg] 155 kg (11/26 0500)  Hemodynamic parameters for last 24 hours: PAP: (18-50)/(6-31) 28/14 CO:  [5.1 L/min-8.8 L/min] 7.7 L/min CI:  [1.9 L/min/m2-3.2 L/min/m2] 2.8 L/min/m2  Intake/Output from previous day: 11/25 0701 - 11/26 0700 In: 7389.9 [I.V.:4815.6; Blood:900; IV Piggyback:1674.4] Out: 4005 [Urine:2215; Blood:1100; Chest Tube:690] Intake/Output this shift: No intake/output data recorded.  General appearance: alert and cooperative Neurologic: intact Heart: regular rate and rhythm, S1, S2 normal, no murmur, click, rub or gallop Lungs: clear to auscultation bilaterally Extremities: edema mild Wound: dressings dry  Lab Results: Recent Labs    03/20/18 2030 03/21/18 0413  WBC 15.5* 13.7*  HGB 12.0* 11.6*  HCT 37.2* 35.7*  PLT 170 181   BMET:  Recent Labs    03/20/18 2024 03/20/18 2030 03/21/18 0413  NA 137  --  137  K 4.6  --  4.1  CL 104  --  107  CO2  --   --  24  GLUCOSE 159*  --  112*  BUN 11  --  11  CREATININE 0.60* 0.67 0.75  CALCIUM  --   --  8.1*    PT/INR:  Recent Labs    03/20/18 1430  LABPROT 18.0*  INR 1.51   ABG    Component Value Date/Time   PHART 7.379 03/20/2018 1828   HCO3 23.2 03/20/2018 1828   TCO2 26 03/20/2018 2024   ACIDBASEDEF 2.0 03/20/2018  1828   O2SAT 97.0 03/20/2018 1828   CBG (last 3)  Recent Labs    03/21/18 0607 03/21/18 0700 03/21/18 0747  GLUCAP 111* 117* 118*   CXR: bibasilar atelectasis  ECG: sinus brady 59, no acute changes  Assessment/Plan: S/P Procedure(s) (LRB): CORONARY ARTERY BYPASS GRAFTING (CABG)  times two using bilateral internal mammary arteries. Plating of sternal fracture. (N/A) possible RADIAL ARTERY HARVEST (N/A) TRANSESOPHAGEAL ECHOCARDIOGRAM (TEE) (N/A) Hemodynamically stable in sinus rhythm. With sinus brady 59 will not put on Lopressor. Mobilize Diuresis Diabetes control: start Levemir and SSI. Metformin once eating well  d/c tubes/lines Continue foley due to diuresing patient, patient in ICU and urinary output monitoring See progression orders   LOS: 1 day    [name] 03/21/2018

## 2018-03-21 NOTE — Progress Notes (Signed)
      [address]       [place] [address]             [phone]      POD  #1 CABG  Resting comfortably BP 129/71   Pulse 80   Temp 98.4 F (36.9 C) (Oral)   Resp 16   Ht 6\' 3"  (1.905 m)   Wt (!) 155 kg   SpO2 93%   BMI 42.71 kg/m   Intake/Output Summary (Last 24 hours) at 03/21/2018 1821 Last data filed at 03/21/2018 1500 Gross per 24 hour  Intake 1089.35 ml  Output 2320 ml  Net -1230.65 ml   K= 4.0, HCT= 35  Doing well POD # 1  Kidus Delman C. Roxan Hockey, MD [facility] [phone]

## 2018-03-22 ENCOUNTER — Encounter (HOSPITAL_COMMUNITY): Payer: Self-pay | Admitting: *Deleted

## 2018-03-22 ENCOUNTER — Inpatient Hospital Stay (HOSPITAL_COMMUNITY): Payer: BLUE CROSS/BLUE SHIELD

## 2018-03-22 LAB — CBC
HCT: 36.7 % — ABNORMAL LOW (ref 39.0–52.0)
Hemoglobin: 12 g/dL — ABNORMAL LOW (ref 13.0–17.0)
MCH: 31.1 pg (ref 26.0–34.0)
MCHC: 32.7 g/dL (ref 30.0–36.0)
MCV: 95.1 fL (ref 80.0–100.0)
NRBC: 0 % (ref 0.0–0.2)
Platelets: 166 10*3/uL (ref 150–400)
RBC: 3.86 MIL/uL — AB (ref 4.22–5.81)
RDW: 12.8 % (ref 11.5–15.5)
WBC: 12.3 10*3/uL — ABNORMAL HIGH (ref 4.0–10.5)

## 2018-03-22 LAB — GLUCOSE, CAPILLARY
GLUCOSE-CAPILLARY: 132 mg/dL — AB (ref 70–99)
GLUCOSE-CAPILLARY: 139 mg/dL — AB (ref 70–99)
Glucose-Capillary: 104 mg/dL — ABNORMAL HIGH (ref 70–99)
Glucose-Capillary: 123 mg/dL — ABNORMAL HIGH (ref 70–99)
Glucose-Capillary: 128 mg/dL — ABNORMAL HIGH (ref 70–99)
Glucose-Capillary: 141 mg/dL — ABNORMAL HIGH (ref 70–99)

## 2018-03-22 LAB — BASIC METABOLIC PANEL
ANION GAP: 7 (ref 5–15)
BUN: 12 mg/dL (ref 8–23)
CALCIUM: 8.5 mg/dL — AB (ref 8.9–10.3)
CHLORIDE: 102 mmol/L (ref 98–111)
CO2: 26 mmol/L (ref 22–32)
Creatinine, Ser: 0.9 mg/dL (ref 0.61–1.24)
GFR calc non Af Amer: >60 mL/min (ref >60)
Glucose, Bld: 152 mg/dL — ABNORMAL HIGH (ref 70–99)
Potassium: 4 mmol/L (ref 3.5–5.1)
Sodium: 135 mmol/L (ref 135–145)

## 2018-03-22 MED ORDER — ACETAMINOPHEN 500 MG PO TABS
1000.0000 mg | ORAL_TABLET | Freq: Four times a day (QID) | ORAL | Status: DC | PRN
  Administered 2018-03-22 – 2018-03-24 (×4): 1000 mg via ORAL
  Filled 2018-03-22 (×4): qty 2

## 2018-03-22 MED ORDER — BISACODYL 10 MG RE SUPP: 10.0000 mg | Freq: Every day | RECTAL | Status: DC | PRN

## 2018-03-22 MED ORDER — FLUTICASONE PROPIONATE 50 MCG/ACT NA SUSP
2.0000 | Freq: Every day | NASAL | Status: DC | PRN
  Filled 2018-03-22: qty 16

## 2018-03-22 MED ORDER — PRAMIPEXOLE DIHYDROCHLORIDE 0.25 MG PO TABS
0.1250 mg | ORAL_TABLET | Freq: Every day | ORAL | Status: DC
  Administered 2018-03-22 – 2018-03-23 (×2): 0.125 mg via ORAL
  Filled 2018-03-22 (×2): qty 1

## 2018-03-22 MED ORDER — ONDANSETRON HCL 4 MG PO TABS: 4.0000 mg | ORAL_TABLET | Freq: Four times a day (QID) | ORAL | Status: DC | PRN

## 2018-03-22 MED ORDER — OXYCODONE HCL 5 MG PO TABS
5.0000 mg | ORAL_TABLET | ORAL | Status: DC | PRN
  Administered 2018-03-22 – 2018-03-23 (×3): 10 mg via ORAL
  Filled 2018-03-22 (×3): qty 2

## 2018-03-22 MED ORDER — TRAMADOL HCL 50 MG PO TABS
50.0000 mg | ORAL_TABLET | Freq: Four times a day (QID) | ORAL | Status: DC | PRN
  Administered 2018-03-23: 100 mg via ORAL
  Filled 2018-03-22: qty 2

## 2018-03-22 MED ORDER — INSULIN ASPART 100 UNIT/ML ~~LOC~~ SOLN: 0.0000 [IU] | Freq: Three times a day (TID) | SUBCUTANEOUS | Status: DC

## 2018-03-22 MED ORDER — INSULIN ASPART 100 UNIT/ML ~~LOC~~ SOLN
0.0000 [IU] | Freq: Three times a day (TID) | SUBCUTANEOUS | Status: DC
  Administered 2018-03-22 – 2018-03-23 (×4): 2 [IU] via SUBCUTANEOUS

## 2018-03-22 MED ORDER — PANTOPRAZOLE SODIUM 40 MG PO TBEC
40.0000 mg | DELAYED_RELEASE_TABLET | Freq: Every day | ORAL | Status: DC
  Administered 2018-03-23 – 2018-03-24 (×2): 40 mg via ORAL
  Filled 2018-03-22 (×2): qty 1

## 2018-03-22 MED ORDER — METOPROLOL TARTRATE 25 MG PO TABS
25.0000 mg | ORAL_TABLET | Freq: Two times a day (BID) | ORAL | Status: DC
  Administered 2018-03-22 – 2018-03-24 (×4): 25 mg via ORAL
  Filled 2018-03-22 (×4): qty 1

## 2018-03-22 MED ORDER — MOVING RIGHT ALONG BOOK
Freq: Once | Status: DC
  Filled 2018-03-22: qty 1

## 2018-03-22 MED ORDER — SODIUM CHLORIDE 0.9% FLUSH: 3.0000 mL | INTRAVENOUS | Status: DC | PRN

## 2018-03-22 MED ORDER — SODIUM CHLORIDE 0.9% FLUSH
3.0000 mL | Freq: Two times a day (BID) | INTRAVENOUS | Status: DC
  Administered 2018-03-22 – 2018-03-23 (×3): 3 mL via INTRAVENOUS

## 2018-03-22 MED ORDER — ONDANSETRON HCL 4 MG/2ML IJ SOLN: 4.0000 mg | Freq: Four times a day (QID) | INTRAMUSCULAR | Status: DC | PRN

## 2018-03-22 MED ORDER — ASPIRIN EC 325 MG PO TBEC
325.0000 mg | DELAYED_RELEASE_TABLET | Freq: Every day | ORAL | Status: DC
  Administered 2018-03-23 – 2018-03-24 (×2): 325 mg via ORAL
  Filled 2018-03-22 (×2): qty 1

## 2018-03-22 MED ORDER — BISACODYL 5 MG PO TBEC
10.0000 mg | DELAYED_RELEASE_TABLET | Freq: Every day | ORAL | Status: DC | PRN
  Administered 2018-03-22: 10 mg via ORAL
  Filled 2018-03-22: qty 2

## 2018-03-22 MED ORDER — DOCUSATE SODIUM 100 MG PO CAPS
200.0000 mg | ORAL_CAPSULE | Freq: Every day | ORAL | Status: DC
  Administered 2018-03-23 – 2018-03-24 (×2): 200 mg via ORAL
  Filled 2018-03-22 (×2): qty 2

## 2018-03-22 MED ORDER — SODIUM CHLORIDE 0.9 % IV SOLN: 250.0000 mL | INTRAVENOUS | Status: DC | PRN

## 2018-03-22 NOTE — Discharge Instructions (Signed)

## 2018-03-22 NOTE — Progress Notes (Signed)
2 Days Post-Op Procedure(s) (LRB): CORONARY ARTERY BYPASS GRAFTING (CABG)  times two using bilateral internal mammary arteries. Plating of sternal fracture. (N/A) possible RADIAL ARTERY HARVEST (N/A) TRANSESOPHAGEAL ECHOCARDIOGRAM (TEE) (N/A) Subjective: No complaints. Ambulating well, no BM yet  Objective: Vital signs in last 24 hours: Temp:  [97.8 F (36.6 C)-99.1 F (37.3 C)] 97.8 F (36.6 C) (11/27 0813) Pulse Rate:  [79-89] 80 (11/27 0600) Cardiac Rhythm: Atrial paced (11/27 0000) Resp:  [14-30] 27 (11/27 0600) BP: (112-159)/(59-89) 127/69 (11/27 0600) SpO2:  [91 %-99 %] 95 % (11/27 0600) Arterial Line BP: (136-155)/(53-61) 148/57 (11/26 1200) Weight:  [152.4 kg] 152.4 kg (11/27 0356)  Hemodynamic parameters for last 24 hours:    Intake/Output from previous day: 11/26 0701 - 11/27 0700 In: 424.5 [P.O.:120; I.V.:104.5; IV Piggyback:200] Out: 2610 [Urine:2570; Chest Tube:40] Intake/Output this shift: No intake/output data recorded.  General appearance: alert and cooperative Neurologic: intact Heart: regular rate and rhythm, S1, S2 normal, no murmur, click, rub or gallop Lungs: clear to auscultation bilaterally Extremities: extremities normal, atraumatic, no cyanosis or edema Wound: dressing dry  Lab Results: Recent Labs    03/21/18 1719 03/21/18 1721 03/22/18 0515  WBC 13.7*  --  12.3*  HGB 12.5* 11.9* 12.0*  HCT 38.7* 35.0* 36.7*  PLT 181  --  166   BMET:  Recent Labs    03/21/18 0413  03/21/18 1721 03/22/18 0515  NA 137  --  135 135  K 4.1  --  4.0 4.0  CL 107  --  100 102  CO2 24  --   --  26  GLUCOSE 112*  --  122* 152*  BUN 11  --  12 12  CREATININE 0.75   < > 0.80 0.90  CALCIUM 8.1*  --   --  8.5*   < > = values in this interval not displayed.    PT/INR:  Recent Labs    03/20/18 1430  LABPROT 18.0*  INR 1.51   ABG    Component Value Date/Time   PHART 7.379 03/20/2018 1828   HCO3 23.2 03/20/2018 1828   TCO2 29 03/21/2018 1721   ACIDBASEDEF 2.0 03/20/2018 1828   O2SAT 97.0 03/20/2018 1828   CBG (last 3)  Recent Labs    03/22/18 0000 03/22/18 0430 03/22/18 0815  GLUCAP 123* 141* 132*   CXR: clear  Assessment/Plan: S/P Procedure(s) (LRB): CORONARY ARTERY BYPASS GRAFTING (CABG)  times two using bilateral internal mammary arteries. Plating of sternal fracture. (N/A) possible RADIAL ARTERY HARVEST (N/A) TRANSESOPHAGEAL ECHOCARDIOGRAM (TEE) (N/A)  POD 2  Hemodynamically stable in sinus rhythm. HR now 80's so will resume low dose Lopressor.  Diuresed well yesterday and below preop wt if accurate.  DM: glucose under good control. Continue Levemir and SSI for now. Resume Metformin when eating well.  DC foley, sleeve.  Transfer to 4E and continue mobilization.   LOS: 2 days    [name] 03/22/2018

## 2018-03-22 NOTE — Consult Note (Signed)
[address]       [place] [address]             [phone]      Physician Discharge Summary  Patient ID: Garrett Welch MRN: [id number] DOB/AGE: 1951-04-19 67 y.o.  Admit date: 03/20/2018 Discharge date: 03/24/2018  Admission Diagnoses: Patient Active Problem List   Diagnosis Date Noted  . Abnormal nuclear cardiac imaging test 02/22/2018  . Atrial flutter with rapid ventricular response ([place]) 09/16/2017  . Hypotension 09/16/2017  . HTN (hypertension)   . 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 His left ventricular function was normal and so he developed atrial flutter with rapid ventricular response and was shown to have an ejection fraction of 30%. He underwent atrial flutter ablation with restoration of sinus rhythm and his ejection fraction increased to 50 to 55% by echocardiogram on 12/16/2017. He underwent an exercise stress test on 01/09/2018 which showed an ejection fraction of 41%. There were no ST segment changes during stress. There is a medium size defect of moderate severity in the apical anterior, apical septal, apical inferior, apical lateral, and apical location findings consistent with ischemia. He was seen in follow-up by Dr. Burt Knack on 02/10/2018. He underwent cardiac catheterization on 02/22/2018 which showed 40 to 50% distal left main stenosis. There is chronic total  occlusion of the proximal LAD with right to left collaterals supplying the mid and distal LAD. There is subtotal occlusion of the first diagonal branch. There is moderate stenosis in the large ramus branch of 60%. The right coronary was a large dominant vessel with mild nonobstructive disease. Left ventricular ejection fraction was 50 to 55%.  He works as a Transport planner. The patient denies any chest pain or pressure. He has had no pain in his neck or jaw or arms. He has had exertional fatigue and tiredness. He denies any shortness of breath with exertion. He has not been very active since his ablation. He has a history of lower extremity venous stasis ulcers and is undergone bilateral saphenous vein ablation with laser in the past in [place].     Hospital Course:  As dictated by Nicholes Rough: On 03/20/2018 [name] underwent a coronary bypass grafting x2 utilizing bilateral internal mammary arteries with Dr. Cyndia Bent.  He tolerated procedure well and was transferred to the surgical ICU.  He was extubated in a timely manner.  Postop day 1 he remained stable and a normal sinus rhythm.  He did have occasional sinus bradycardia therefore we held his Lopressor.  We began to mobilize the patient.  We initiated a diuretic regimen for fluid overload.  We started Levemir and SSI.  We held metformin for now until he is eating better.  Discontinued chest tubes and lines.  We continue his Foley catheter for accurate I's and O's.  Postop day 2 he remained hemodynamically stable he was now normal sinus rhythm in the 80s so we resumed his low-dose Lopressor.  His blood glucose level was well controlled.  We resumed metformin since he began eating.  We discontinued his Foley and sleeve.  He was stable to transfer to [facility] for continued care on 03/22/2018.  Addendum: He remains in sinus rhythm. He is on Lopressor 25 mg bid and Irbesartan 75 mg daily with good control of his blood pressure. He has been ambulating on  room air. Sternal wound is clean and dry. He has been tolerating a diet and has had a bowel movement. Epicardial pacing wires were removed on 11/28. Chest tube sutures will remain and be removed in the office after discharge. As discussed with Dr. Prescott Gum, he is surgically stable for discharge today.  Consults: None  Significant Diagnostic Studies:  CLINICAL DATA:  Chest pain following coronary bypass grafting  EXAM: PORTABLE CHEST 1 VIEW  COMPARISON:  03/22/2015  FINDINGS: Cardiac shadow is stable. Postsurgical changes are again seen. Mediastinal drain and bilateral thoracostomy catheters have been removed in the interval. Swan-Ganz catheter has also been removed. Mild bibasilar atelectasis is again identified and stable. No new focal abnormality is noted.  IMPRESSION: Interval removal of tubes and lines as described.  Stable bibasilar atelectasis.   Electronically Signed   By: Inez Catalina M.D.   On: 03/22/2018 08:47  Treatments:   CARDIOVASCULAR SURGERY OPERATIVE NOTE  03/20/2018  Surgeon:  [name], MD  First Assistant: Nicholes Rough,  PA-C   Preoperative Diagnosis:  Severe multi-vessel coronary artery disease   Postoperative Diagnosis:  Same   Procedure:  1. Median Sternotomy 2. Extracorporeal circulation 3.   Coronary artery bypass grafting x 2   Left internal mammary artery graft to the LAD  Free right internal mammary artery graft to the diagonal  4.   Repair of sternal fracture with KLS Martin locking bone plate   Anesthesia:  General Endotracheal   Discharge Exam: Blood pressure 125/65, pulse 63, temperature 98 F (36.7 C), temperature source Oral, resp. rate 20, height 6\' 3"  (1.905 m), weight (!) 150 kg, SpO2 92 %.  Cardiovascular: RRR Pulmonary: Clear to auscultation bilaterally Abdomen: Soft, non tender, bowel sounds present. Extremities: Trace bilateral lower extremity edema and chronic venous stasis  changes Wounds: Sternal dressing removed and wound is clean and dry.  No erythema or signs of infection.   Disposition: Discharge disposition: 01-Home or Self Care       Discharge Instructions    Amb Referral to Cardiac Rehabilitation   Complete by:  As directed    Diagnosis:  CABG   CABG X ___:  2     Allergies as of 03/24/2018      Reactions   Tape Rash   With prolonged use--blisters      Medication List    STOP taking these medications   hydrochlorothiazide 25 MG tablet Commonly known as:  HYDRODIURIL   isosorbide mononitrate 30 MG 24 hr tablet Commonly known as:  IMDUR     TAKE these medications   acetaminophen 500 MG tablet Commonly known as:  TYLENOL Take 1,000 mg by mouth every 8 (eight) hours as needed (knee pain).   albuterol 108 (90 Base) MCG/ACT inhaler Commonly known as:  PROVENTIL HFA;VENTOLIN HFA Inhale 2 puffs into the lungs every 6 (six) hours as needed for wheezing or shortness of breath.   aspirin 325 MG EC tablet Take 1 tablet (325 mg total) by mouth daily. What changed:    medication strength  how much to take  when to take this  COLLAGEN PO Take 2 capsules by mouth daily.   ferrous sulfate 325 (65 FE) MG EC tablet Take 1 tablet (325 mg total) by mouth daily with breakfast. What changed:  when to take this   Fish Oil 1000 MG Caps Take 1,000 mg by mouth daily.   fluticasone 50 MCG/ACT nasal spray Commonly known as:  FLONASE Place 2 sprays into both nostrils daily as needed for allergies.   irbesartan 75 MG tablet Commonly known as:  AVAPRO Take 1 tablet (75 mg total) by mouth daily.   LUBRICANT EYE DROPS 0.4-0.3 % Soln Generic drug:  Polyethyl Glycol-Propyl Glycol Place 1 drop into both eyes 3 (three) times daily as needed (for dry/irritated eyes.).   metFORMIN 500 MG tablet Commonly known as:  GLUCOPHAGE Take 1,000 mg by mouth 2 (two) times daily.   metoprolol tartrate 50 MG tablet Commonly known as:  LOPRESSOR Take  0.5 tablets (25 mg total) by mouth 2 (two) times daily.   multivitamin with minerals tablet Take 1 tablet by mouth daily.   pramipexole 0.125 MG tablet Commonly known as:  MIRAPEX Take 0.125 mg by mouth at bedtime.   rosuvastatin 10 MG tablet Commonly known as:  CRESTOR Take 10 mg by mouth every evening.   sodium fluoride 1.1 % Crea dental cream Commonly known as:  PREVIDENT 8756 PLUS Take 1 application by mouth 2 (two) times daily.   tamsulosin 0.4 MG Caps capsule Commonly known as:  FLOMAX Take 0.4 mg by mouth at bedtime.   traMADol 50 MG tablet Commonly known as:  ULTRAM Take 1 tablet (50 mg total) by mouth every 6 (six) hours as needed for moderate pain.     The patient has been discharged on:   1.Beta Blocker:  Yes [  x ]                              No   [   ]                              If No, reason:  2.Ace Inhibitor/ARB: Yes [  x ]                                     No  [    ]                                     If No, reason:  3.Statin:   Yes [ x  ]                  No  [   ]                  If No, reason:  4.Ecasa:  Yes  [ x  ]                  No   [   ]                  If No, reason:   Follow-up Information    Tempie Hoist, MD. Call in 1 day(s).   Specialty:  Internal Medicine Contact information: [place] [address] [phone]  [name], MD Follow up.   Specialty:  Cardiothoracic Surgery Why:  Your routine 4-week follow-up appointment is on 04/24/2018 at 1:30 PM.  Please arrive at 1 PM for a chest x-ray located at [facility] imaging which is in the first floor of our building. Contact information: [address] [place] [address] [phone]        Nursing suture removal appointment Follow up.   Why:  Your suture removal appointment is on 04/03/2018 at 10 AM Contact information: Dr. Vivi Martens office       Kathlen Mody, Nicki Reaper T, PA-C Follow up.   Specialties:   Cardiology, Physician Assistant Why:    Richardson Dopp, PA-C 12/13 @8 :45 am ([facility])    Contact information: [address] [place] [address] [phone]              Signed: Arnoldo Lenis 03/24/2018, 8:20 AM

## 2018-03-22 NOTE — Progress Notes (Addendum)
CARDIAC REHAB PHASE I   PRE:  Rate/Rhythm: 107 ST  Up in room    MODE:  Ambulation: 420 ft   POST:  Rate/Rhythm: 120 ST  BP:  Supine: 135/78  Sitting:   Standing:    SaO2: 96%RA 1435-1502 Pt walked 420 ft on RA with rolling walker with steady gait. Tolerated well. To bed after walk. Pt has attended CRP 2 and asked about attending again. Referred to [place] CRP 2.    Graylon Good, RN BSN  03/22/2018 2:59 PM

## 2018-03-22 NOTE — Progress Notes (Signed)
CPAP at bedside, set-up and ready for patient. Patient stated he would place on himself when he was ready. Informed patient to have RN to call RT if he needed further assistance.

## 2018-03-23 LAB — GLUCOSE, CAPILLARY
GLUCOSE-CAPILLARY: 97 mg/dL (ref 70–99)
Glucose-Capillary: 127 mg/dL — ABNORMAL HIGH (ref 70–99)
Glucose-Capillary: 109 mg/dL — ABNORMAL HIGH (ref 70–99)
Glucose-Capillary: 115 mg/dL — ABNORMAL HIGH (ref 70–99)

## 2018-03-23 MED ORDER — IRBESARTAN 150 MG PO TABS
75.0000 mg | ORAL_TABLET | Freq: Every day | ORAL | Status: DC
  Administered 2018-03-23 – 2018-03-24 (×2): 75 mg via ORAL
  Filled 2018-03-23 (×2): qty 1

## 2018-03-23 MED ORDER — METFORMIN HCL 500 MG PO TABS
1000.0000 mg | ORAL_TABLET | Freq: Two times a day (BID) | ORAL | Status: DC
  Administered 2018-03-23 – 2018-03-24 (×3): 1000 mg via ORAL
  Filled 2018-03-23 (×3): qty 2

## 2018-03-23 MED ORDER — METFORMIN HCL 500 MG PO TABS: 1000.0000 mg | ORAL_TABLET | Freq: Two times a day (BID) | ORAL | Status: DC

## 2018-03-23 NOTE — Progress Notes (Signed)
Pt ambulating hall independently in hall with wife, using RW.

## 2018-03-23 NOTE — Progress Notes (Signed)
[address]       [place] [address]             [phone]      3 Days Post-Op Procedure(s) (LRB): CORONARY ARTERY BYPASS GRAFTING (CABG)  times two using bilateral internal mammary arteries. Plating of sternal fracture. (N/A) possible RADIAL ARTERY HARVEST (N/A) TRANSESOPHAGEAL ECHOCARDIOGRAM (TEE) (N/A) Subjective: Feels well  Objective: Vital signs in last 24 hours: Temp:  [97.8 F (36.6 C)-98.7 F (37.1 C)] 98.3 F (36.8 C) (11/28 0528) Pulse Rate:  [80-109] 90 (11/28 0528) Cardiac Rhythm: Normal sinus rhythm (11/27 2200) Resp:  [17-33] 33 (11/28 0534) BP: (140-151)/(64-83) 142/73 (11/28 0528) SpO2:  [92 %-100 %] 96 % (11/28 0528) Weight:  [150.3 kg] 150.3 kg (11/28 0534)  Hemodynamic parameters for last 24 hours:    Intake/Output from previous day: 11/27 0701 - 11/28 0700 In: 520 [P.O.:520] Out: 200 [Urine:200] Intake/Output this shift: No intake/output data recorded.  General appearance: alert, cooperative and no distress Heart: regular rate and rhythm Lungs: clear to auscultation bilaterally Abdomen: benign Extremities: + edema Wound: incis healing well  Lab Results: Recent Labs    03/21/18 1719 03/21/18 1721 03/22/18 0515  WBC 13.7*  --  12.3*  HGB 12.5* 11.9* 12.0*  HCT 38.7* 35.0* 36.7*  PLT 181  --  166   BMET:  Recent Labs    03/21/18 0413  03/21/18 1721 03/22/18 0515  NA 137  --  135 135  K 4.1  --  4.0 4.0  CL 107  --  100 102  CO2 24  --   --  26  GLUCOSE 112*  --  122* 152*  BUN 11  --  12 12  CREATININE 0.75   < > 0.80 0.90  CALCIUM 8.1*  --   --  8.5*   < > = values in this interval not displayed.    PT/INR:  Recent Labs    03/20/18 1430  LABPROT 18.0*  INR 1.51   ABG    Component Value Date/Time   PHART 7.379 03/20/2018 1828   HCO3 23.2 03/20/2018 1828   TCO2 29 03/21/2018 1721   ACIDBASEDEF 2.0 03/20/2018 1828   O2SAT 97.0 03/20/2018 1828   CBG (last 3)  Recent Labs     03/22/18 1636 03/22/18 2128 03/23/18 0611  GLUCAP 128* 139* 127*    Meds Scheduled Meds: . aspirin EC  325 mg Oral Daily  . docusate sodium  200 mg Oral Daily  . enoxaparin (LOVENOX) injection  40 mg Subcutaneous QHS  . insulin aspart  0-24 Units Subcutaneous TID AC & HS  . insulin detemir  30 Units Subcutaneous Daily  . mouth rinse  15 mL Mouth Rinse BID  . metoprolol tartrate  25 mg Oral BID  . moving right along book   Does not apply Once  . pantoprazole  40 mg Oral QAC breakfast  . pramipexole  0.125 mg Oral QHS  . rosuvastatin  10 mg Oral QPM  . sodium chloride flush  3 mL Intravenous Q12H  . tamsulosin  0.4 mg Oral QHS   Continuous Infusions: . sodium chloride     PRN Meds:.sodium chloride, acetaminophen, bisacodyl **OR** bisacodyl, fluticasone, ondansetron **OR** ondansetron (ZOFRAN) IV, oxyCODONE, sodium chloride flush, traMADol  Xrays Dg Chest Port 1 View  Result Date: 03/22/2018 CLINICAL DATA:  Chest pain following coronary bypass grafting EXAM: PORTABLE CHEST 1 VIEW COMPARISON:  03/22/2015 FINDINGS: Cardiac shadow is stable. Postsurgical changes  are again seen. Mediastinal drain and bilateral thoracostomy catheters have been removed in the interval. Swan-Ganz catheter has also been removed. Mild bibasilar atelectasis is again identified and stable. No new focal abnormality is noted. IMPRESSION: Interval removal of tubes and lines as described. Stable bibasilar atelectasis. Electronically Signed   By: Inez Catalina M.D.   On: 03/22/2018 08:47    Assessment/Plan: S/P Procedure(s) (LRB): CORONARY ARTERY BYPASS GRAFTING (CABG)  times two using bilateral internal mammary arteries. Plating of sternal fracture. (N/A) possible RADIAL ARTERY HARVEST (N/A) TRANSESOPHAGEAL ECHOCARDIOGRAM (TEE) (N/A)  1 doing well POD #3 CABG x 2/BIMA 2 hemodyn stable in sinus rhythm with some HTN- restart avapro 75mg ( home dose ARB) 3 conts to diurese well, some edema but wt conts to  decrease so no furosemide for now 4 BS control is good- resume home metformin and d/c insulin- A1c 6.4 at home so will need aggressive lifestyle modification for insulin resistance/metabolic syndrome 5 no new labs 6 d/c epw's  7 Push routine pulm toilet/rehab- poss home in am   LOS: 3 days    [facility] 03/23/2018 Pager [phone]

## 2018-03-23 NOTE — Progress Notes (Signed)
Pt ref to wear CPAP at the time and stated that last night it kept having to much water in pt home mask. Encouraged pt to try again tonight so that I could assist with problem and pt stated not at this time but would call if or when he changed his mind.

## 2018-03-24 LAB — BPAM RBC
BLOOD PRODUCT EXPIRATION DATE: 201912212359
BLOOD PRODUCT EXPIRATION DATE: 201912222359
ISSUE DATE / TIME: 201911250842
ISSUE DATE / TIME: 201911250842
UNIT TYPE AND RH: 5100
Unit Type and Rh: 5100

## 2018-03-24 LAB — TYPE AND SCREEN
ABO/RH(D): O POS
ANTIBODY SCREEN: NEGATIVE
UNIT DIVISION: 0
UNIT DIVISION: 0

## 2018-03-24 LAB — GLUCOSE, CAPILLARY: GLUCOSE-CAPILLARY: 124 mg/dL — AB (ref 70–99)

## 2018-03-24 MED ORDER — FERROUS SULFATE 325 (65 FE) MG PO TBEC: 325.0000 mg | DELAYED_RELEASE_TABLET | Freq: Every day | ORAL | 3 refills | Status: AC

## 2018-03-24 MED ORDER — TRAMADOL HCL 50 MG PO TABS: 50.0000 mg | ORAL_TABLET | Freq: Four times a day (QID) | ORAL | 0 refills | Status: DC | PRN

## 2018-03-24 MED ORDER — ASPIRIN 325 MG PO TBEC: 325.0000 mg | DELAYED_RELEASE_TABLET | Freq: Every day | ORAL | 0 refills | Status: DC

## 2018-03-24 MED FILL — Mannitol IV Soln 20%: INTRAVENOUS | Qty: 500 | Status: AC

## 2018-03-24 MED FILL — Lidocaine HCl(Cardiac) IV PF Soln Pref Syr 100 MG/5ML (2%): INTRAVENOUS | Qty: 5 | Status: AC

## 2018-03-24 MED FILL — Electrolyte-R (PH 7.4) Solution: INTRAVENOUS | Qty: 3000 | Status: AC

## 2018-03-24 MED FILL — Sodium Bicarbonate IV Soln 8.4%: INTRAVENOUS | Qty: 50 | Status: AC

## 2018-03-24 MED FILL — Heparin Sodium (Porcine) Inj 1000 Unit/ML: INTRAMUSCULAR | Qty: 10 | Status: AC

## 2018-03-24 MED FILL — Sodium Chloride IV Soln 0.9%: INTRAVENOUS | Qty: 2000 | Status: AC

## 2018-03-24 NOTE — Progress Notes (Signed)
Garrett Welch to be D/C'd Home per MD order. Discussed with the patient and all questions fully answered.    IV catheter discontinued intact. Site without signs and symptoms of complications. Dressing and pressure applied.  An After Visit Summary was printed and given to the patient.  Patient escorted via [place], and D/C home via private auto.  [name]  03/24/2018 9:36 AM

## 2018-03-24 NOTE — Progress Notes (Addendum)
      [address]       [place] [address]             [phone]        4 Days Post-Op Procedure(s) (LRB): CORONARY ARTERY BYPASS GRAFTING (CABG)  times two using bilateral internal mammary arteries. Plating of sternal fracture. (N/A) possible RADIAL ARTERY HARVEST (N/A) TRANSESOPHAGEAL ECHOCARDIOGRAM (TEE) (N/A)  Subjective: Patient without complaints this am. He hopes to go home.  Objective: Vital signs in last 24 hours: Temp:  [98 F (36.7 C)-98.5 F (36.9 C)] 98 F (36.7 C) (11/29 0405) Pulse Rate:  [63-73] 63 (11/29 0405) Cardiac Rhythm: Normal sinus rhythm (11/29 0405) Resp:  [16-26] 20 (11/29 5170) BP: (114-131)/(57-65) 125/65 (11/29 0405) SpO2:  [92 %-98 %] 92 % (11/29 0405) Weight:  [150 kg] 150 kg (11/29 0174)  Pre op weight 152.5 kg Current Weight  03/24/18 (!) 150 kg       Intake/Output from previous day: 11/28 0701 - 11/29 0700 In: 1380 [P.O.:1380] Out: -    Physical Exam:  Cardiovascular: RRR Pulmonary: Clear to auscultation bilaterally Abdomen: Soft, non tender, bowel sounds present. Extremities: Trace bilateral lower extremity edema and chronic venous stasis changes Wounds: Sternal dressing removed and wound is clean and dry.  No erythema or signs of infection.  Lab Results: CBC: Recent Labs    03/21/18 1719 03/21/18 1721 03/22/18 0515  WBC 13.7*  --  12.3*  HGB 12.5* 11.9* 12.0*  HCT 38.7* 35.0* 36.7*  PLT 181  --  166   BMET:  Recent Labs    03/21/18 1721 03/22/18 0515  NA 135 135  K 4.0 4.0  CL 100 102  CO2  --  26  GLUCOSE 122* 152*  BUN 12 12  CREATININE 0.80 0.90  CALCIUM  --  8.5*    PT/INR:  Lab Results  Component Value Date   INR 1.51 03/20/2018   INR 1.07 03/16/2018   ABG:  INR: Will add last result for INR, ABG once components are confirmed Will add last 4 CBG results once components are confirmed  Assessment/Plan:  1. CV - SR. On Lopressor 25 mg bid and Irbesartan 75 mg  daily. 2.  Pulmonary - On room air. Encourage incentive spirometer. 3.  Acute blood loss anemia - Last H and H stable at 12 and 36.7 4. DM-CBGs 109/115/124. On Metformin 1000 mg bid and Insulin. Will stop scheduled Insulin. Pre op HGA1C 6.4. 5. Chest tube sutures to remain 6. Discharge later today  [facility] 03/24/2018,7:24 AM [phone]

## 2018-03-24 NOTE — Discharge Summary (Signed)
[address]       [place] [address]             [phone]      Physician Discharge Summary  Patient ID: Garrett Welch MRN: [id number] DOB/AGE: [birth year] 67 y.o.  Admit date: 03/20/2018 Discharge date: 03/24/2018  Admission Diagnoses: Patient Active Problem List   Diagnosis Date Noted  . Abnormal nuclear cardiac imaging test 02/22/2018  . Atrial flutter with rapid ventricular response ([place]) 09/16/2017  . Hypotension 09/16/2017  . HTN (hypertension)   . 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 His left ventricular function was normal and so he developed atrial flutter with rapid ventricular response and was shown to have an ejection fraction of 30%. He underwent atrial flutter ablation with restoration of sinus rhythm and his ejection fraction increased to 50 to 55% by echocardiogram on 12/16/2017. He underwent an exercise stress test on 01/09/2018 which showed an ejection fraction of 41%. There were no ST segment changes during stress. There is a medium size defect of moderate severity in the apical anterior, apical septal, apical inferior, apical lateral, and apical location findings consistent with ischemia. He was seen in follow-up by Dr. Burt Knack on 02/10/2018. He underwent cardiac catheterization on 02/22/2018 which showed 40 to 50% distal left main stenosis. There is chronic total  occlusion of the proximal LAD with right to left collaterals supplying the mid and distal LAD. There is subtotal occlusion of the first diagonal branch. There is moderate stenosis in the large ramus branch of 60%. The right coronary was a large dominant vessel with mild nonobstructive disease. Left ventricular ejection fraction was 50 to 55%.  He works as a Transport planner. The patient denies any chest pain or pressure. He has had no pain in his neck or jaw or arms. He has had exertional fatigue and tiredness. He denies any shortness of breath with exertion. He has not been very active since his ablation. He has a history of lower extremity venous stasis ulcers and is undergone bilateral saphenous vein ablation with laser in the past in [place].     Hospital Course:  As dictated by Nicholes Rough: On 03/20/2018 [name] underwent a coronary bypass grafting x2 utilizing bilateral internal mammary arteries with Dr. Cyndia Bent.  He tolerated procedure well and was transferred to the surgical ICU.  He was extubated in a timely manner.  Postop day 1 he remained stable and a normal sinus rhythm.  He did have occasional sinus bradycardia therefore we held his Lopressor.  We began to mobilize the patient.  We initiated a diuretic regimen for fluid overload.  We started Levemir and SSI.  We held metformin for now until he is eating better.  Discontinued chest tubes and lines.  We continue his Foley catheter for accurate I's and O's.  Postop day 2 he remained hemodynamically stable he was now normal sinus rhythm in the 80s so we resumed his low-dose Lopressor.  His blood glucose level was well controlled.  We resumed metformin since he began eating.  We discontinued his Foley and sleeve.  He was stable to transfer to [facility] for continued care on 03/22/2018.  Addendum: He remains in sinus rhythm. He is on Lopressor 25 mg bid and Irbesartan 75 mg daily with good control of his blood pressure. He has been ambulating on  room air. Sternal wound is clean and dry. He has been tolerating a diet and has had a bowel movement. Epicardial pacing wires were removed on 11/28. Chest tube sutures will remain and be removed in the office after discharge. As discussed with Dr. Prescott Gum, he is surgically stable for discharge today.  Consults: None  Significant Diagnostic Studies:  CLINICAL DATA:  Chest pain following coronary bypass grafting  EXAM: PORTABLE CHEST 1 VIEW  COMPARISON:  03/22/2015  FINDINGS: Cardiac shadow is stable. Postsurgical changes are again seen. Mediastinal drain and bilateral thoracostomy catheters have been removed in the interval. Swan-Ganz catheter has also been removed. Mild bibasilar atelectasis is again identified and stable. No new focal abnormality is noted.  IMPRESSION: Interval removal of tubes and lines as described.  Stable bibasilar atelectasis.   Electronically Signed   By: Inez Catalina M.D.   On: 03/22/2018 08:47  Treatments:   CARDIOVASCULAR SURGERY OPERATIVE NOTE  03/20/2018  Surgeon:  [name], MD  First Assistant: Nicholes Rough,  PA-C   Preoperative Diagnosis:  Severe multi-vessel coronary artery disease   Postoperative Diagnosis:  Same   Procedure:  1. Median Sternotomy 2. Extracorporeal circulation 3.   Coronary artery bypass grafting x 2   Left internal mammary artery graft to the LAD  Free right internal mammary artery graft to the diagonal  4.   Repair of sternal fracture with KLS Martin locking bone plate   Anesthesia:  General Endotracheal   Discharge Exam: Blood pressure 125/65, pulse 63, temperature 98 F (36.7 C), temperature source Oral, resp. rate 20, height 6\' 3"  (1.905 m), weight (!) 150 kg, SpO2 92 %.  Cardiovascular: RRR Pulmonary: Clear to auscultation bilaterally Abdomen: Soft, non tender, bowel sounds present. Extremities: Trace bilateral lower extremity edema and chronic venous stasis  changes Wounds: Sternal dressing removed and wound is clean and dry.  No erythema or signs of infection.   Disposition:    Discharge Instructions    Amb Referral to Cardiac Rehabilitation   Complete by:  As directed    Diagnosis:  CABG   CABG X ___:  2     Allergies as of 03/24/2018      Reactions   Tape Rash   With prolonged use--blisters      Medication List    STOP taking these medications   hydrochlorothiazide 25 MG tablet Commonly known as:  HYDRODIURIL   isosorbide mononitrate 30 MG 24 hr tablet Commonly known as:  IMDUR     TAKE these medications   acetaminophen 500 MG tablet Commonly known as:  TYLENOL Take 1,000 mg by mouth every 8 (eight) hours as needed (knee pain).   albuterol 108 (90 Base) MCG/ACT inhaler Commonly known as:  PROVENTIL HFA;VENTOLIN HFA Inhale 2 puffs into the lungs every 6 (six) hours as needed for wheezing or shortness of breath.   aspirin 325 MG EC tablet Take 1 tablet (325 mg total) by mouth daily. What changed:    medication strength  how much to take  when to take this   COLLAGEN PO Take 2 capsules by mouth  daily.   ferrous sulfate 325 (65 FE) MG EC tablet Take 1 tablet (325 mg total) by mouth daily with breakfast. What changed:  when to take this   Fish Oil 1000 MG Caps Take 1,000 mg by mouth daily.   fluticasone 50 MCG/ACT nasal spray Commonly known as:  FLONASE Place 2 sprays into both nostrils daily as needed for allergies.   irbesartan 75 MG tablet Commonly known as:  AVAPRO Take 1 tablet (75 mg total) by mouth daily.   LUBRICANT EYE DROPS 0.4-0.3 % Soln Generic drug:  Polyethyl Glycol-Propyl Glycol Place 1 drop into both eyes 3 (three) times daily as needed (for dry/irritated eyes.).   metFORMIN 500 MG tablet Commonly known as:  GLUCOPHAGE Take 1,000 mg by mouth 2 (two) times daily.   metoprolol tartrate 50 MG tablet Commonly known as:  LOPRESSOR Take 0.5 tablets (25 mg total) by mouth 2 (two) times  daily.   multivitamin with minerals tablet Take 1 tablet by mouth daily.   pramipexole 0.125 MG tablet Commonly known as:  MIRAPEX Take 0.125 mg by mouth at bedtime.   rosuvastatin 10 MG tablet Commonly known as:  CRESTOR Take 10 mg by mouth every evening.   sodium fluoride 1.1 % Crea dental cream Commonly known as:  PREVIDENT 1497 PLUS Take 1 application by mouth 2 (two) times daily.   tamsulosin 0.4 MG Caps capsule Commonly known as:  FLOMAX Take 0.4 mg by mouth at bedtime.   traMADol 50 MG tablet Commonly known as:  ULTRAM Take 1 tablet (50 mg total) by mouth every 6 (six) hours as needed for moderate pain.     The patient has been discharged on:   1.Beta Blocker:  Yes [  x ]                              No   [   ]                              If No, reason:  2.Ace Inhibitor/ARB: Yes [  x ]                                     No  [    ]                                     If No, reason:  3.Statin:   Yes [ x  ]                  No  [   ]                  If No, reason:  4.Ecasa:  Yes  [ x  ]                  No   [   ]                  If No, reason:   Follow-up Information    Tempie Hoist, MD. Call in 1 day(s).   Specialty:  Internal Medicine Contact information: [place] [address] [phone]  [name], MD Follow up.   Specialty:  Cardiothoracic Surgery Why:  Your routine 4-week follow-up appointment is on 04/24/2018 at 1:30 PM.  Please arrive at 1 PM for a chest x-ray located at [facility] imaging which is in the first floor of our building. Contact information: [place] [address] [phone]        Nursing suture removal appointment Follow up.   Why:  Your suture removal appointment is on 04/03/2018 at 10 AM Contact information: Dr. Vivi Martens office       Kathlen Mody, Nicki Reaper T, PA-C Follow up.   Specialties:  Cardiology, Physician Assistant Why:    Richardson Dopp, PA-C 12/13 @8 :45 am ([facility])    Contact information: [address] [place] [address] [phone]              Signed: Arnoldo Lenis 03/24/2018, 2:39 PM

## 2018-03-24 NOTE — Progress Notes (Signed)
Pt ambulated times 470 feet independent will continue to monitor

## 2018-03-24 NOTE — Progress Notes (Signed)
CARDIAC REHAB PHASE I   Pt in bed off telemetry awaiting discharge. Wife at bedside. Pt and wife very pleasant. Began Education. Reviewed IS use, sternal precautions, risk factors, restrictions, exercise guidelines, heart healthy diet, Phase II Cardiac Rehab. Pt has completed Cardiac Rehab at [facility] previously. Pt eager to do program again. Referral sent to Phase II Cardiac Rehab [place]. Pt and wife verbalized understanding of education.   [phone]  Carma Lair MS, ACSM CEP  8:46 AM 03/24/2018

## 2018-03-30 ENCOUNTER — Telehealth (HOSPITAL_COMMUNITY): Payer: Self-pay

## 2018-03-30 NOTE — Telephone Encounter (Signed)
Attempted to call patient in regards to Cardiac Rehab - LM on VM 

## 2018-03-30 NOTE — Telephone Encounter (Signed)
Pt insurance is active and benefits verified through [place]. Co-pay $0.00, DED $1,500.00/$0.00 met, out of pocket $1,500.00/$0.00 met, co-insurance 0%. No pre-authorization. Passport, 03/30/18 @ 11:20AM, REF# [id number]  Will contact patient to see if he is interested in the Cardiac Rehab Program. If interested, patient will need to complete follow up appt. Once completed, patient will be contacted for scheduling upon review by the RN Navigator.

## 2018-04-01 ENCOUNTER — Other Ambulatory Visit: Payer: Self-pay | Admitting: Cardiovascular Disease

## 2018-04-03 ENCOUNTER — Ambulatory Visit (INDEPENDENT_AMBULATORY_CARE_PROVIDER_SITE_OTHER): Payer: Self-pay | Admitting: *Deleted

## 2018-04-03 DIAGNOSIS — Z4802 Encounter for removal of sutures: Secondary | ICD-10-CM

## 2018-04-03 DIAGNOSIS — Z951 Presence of aortocoronary bypass graft: Secondary | ICD-10-CM

## 2018-04-03 DIAGNOSIS — I2510 Atherosclerotic heart disease of native coronary artery without angina pectoris: Secondary | ICD-10-CM

## 2018-04-04 NOTE — Progress Notes (Signed)
[name] returns s/p CABG X 2 on 03/20/18.  He is doing well at home with no complaints.  He did have severe itching and welts when taking the prescribed Tramadol.  He is now only taking Tylenol if needed. His sternal incision and four previous chest sites are all very well healed.  The four sutures were easily removed. Diet and bowels are resuming back to normal.  He will return as scheduled with a CXR.

## 2018-04-06 ENCOUNTER — Encounter: Payer: Self-pay | Admitting: Physician Assistant

## 2018-04-06 DIAGNOSIS — I2510 Atherosclerotic heart disease of native coronary artery without angina pectoris: Secondary | ICD-10-CM

## 2018-04-06 HISTORY — DX: Atherosclerotic heart disease of native coronary artery without angina pectoris: I25.10

## 2018-04-06 NOTE — Progress Notes (Signed)
Cardiology Office Note:    Date:  04/07/2018   ID:  Lolly Mustache, DOB [birth year], MRN [id number]  PCP:  Tempie Hoist, MD  Cardiologist:  Sherren Mocha, MD   Electrophysiologist:  [name], MD   Referring MD: Tempie Hoist, MD   Chief Complaint  Patient presents with  . Hospitalization Follow-up    s/p CABG     History of Present Illness:    Garrett Welch is a 67 y.o. male with coronary artery disease, atrial flutter, tachycardia induced cardiomyopathy, diabetes, hypertension, hyperlipidemia, prostate CA, obesity s/p prior gastric bypass. 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  He developed reduced LVF with an EF of 30% in the setting of atrial flutter with RVR.  His EF returned to normal at 50-55% after RFCA restoring normal sinus rhythm.  He was last seen by Dr. Burt Knack 01/2018.  A follow up Nuclear stress test had demonstrated apical thinning and mild to moderate apical ischemia.  Cardiac Catheterization was recommended and demonstrated mild to mod distal LM disease (40-50%), CTO of the LAD with R-L collaterals, subtotal occlusion of the D1 and LCx and mod stenosis of the RI.  There were no PCI options and he was referred for CABG.  He was admitted 11/25-11/29 and underwent CABG x 2 with Dr. Cyndia Bent (L-LAD, free RIMA to Dx).  Post bypass course was fairly uneventful aside from bradycardia requiring reduction in his beta-blocker dose and volume overload requiring diuretic therapy.    Garrett Welch returns for post hospital follow up.  He is here with his wife.  Since DC, he has done well.  He has some chest soreness. He had an allergic reaction to Tramadol and has only been taking Acetaminophen lately.  He is eating well and has not had any fever.  He has not had significant shortness of breath, paroxysmal nocturnal dyspnea.  He has no leg swelling or dizziness.    Prior CV studies:   The  following studies were reviewed today:  Pre CABG Dopplers 03/16/18   Summary:  Right Carotid: Velocities in the right ICA are consistent with a 1-39% stenosis.    Left Carotid: Velocities in the left ICA are consistent with a 1-39% stenosis.  Vertebrals: Bilateral vertebral arteries demonstrate antegrade flow.    Right ABI: Resting right ankle-brachial index is within normal range. No evidence of significant right lower extremity arterial disease.  Left ABI: Resting left ankle-brachial index is within normal range. No evidence of significant left lower extremity arterial disease.   Cardiac Catheterization 02/22/18 1.  Mild to moderate distal left main stenosis estimated at 40 to 50% 2.  Chronic total occlusion of the proximal LAD with right to left collaterals supplying the mid and distal LAD territory 3.  Subtotal occlusion of the first diagonal branch in the left circumflex 4.  Moderate stenosis of the large ramus intermedius branch estimated at 60% 5.  Patent RCA with mild nonobstructive stenosis (large dominant vessel) 6.  Mild segmental contraction abnormality the left ventricle with preserved overall LV systolic function  Recommend: The patient does not have typical symptoms of angina.  However he has moderate ischemia on stress testing and based on assessment of his coronary anatomy as at large burden of potential ischemia involving the LAD, diagonal branch, circumflex, and intermediate branches.  I do not think he has any feasible PCI options.  I am going to refer him for  an outpatient cardiac surgical evaluation for consideration of CABG versus ongoing medical therapy.   Nuclear stress test 01/10/18 Abnormal, intermediate risk stress nuclear study with apical thinning and superimposed mild to moderate ischemia in the apex.  The gated ejection fraction is 41% though visually appears better.  Hypokinesis of the distal septum and apex.  Mild left ventricular enlargement.  Echo  12/16/17 Mod conc LVH, EF 50-55, Gr 1 DD, mild dilated ascending aorta (40 mm), MAC, trivial TR  Past Medical History:  Diagnosis Date  . Atrial flutter ([place]) 09/16/2017   S/p RFCA // EF was 30% in setting of AFl w/ RVR // EF improved in NSR (Echo 8/19: Mod conc LVH, EF 50-55, Gr 1 DD, mild dilated ascending aorta (40 mm), MAC, trivial TR)  . CAD (coronary artery disease) 04/06/2018   LHC 10/19: dLM 40-50, LAD 100 CTO, D1 99, LCx 99, RI 60; R-L collats, EF normal // s/p CABG 11/19 (L-LAD, RIMA-Dx)  . Cancer ([place])   . Diabetes mellitus without complication ([place])   . HTN (hypertension)   . Hyperlipidemia LDL goal <70   . OA (osteoarthritis)    right knee  . PAC (premature atrial contraction)   . Pneumonia    walking pneumonia--yrs. ago  . Prostate cancer ([place])   . Sleep apnea    Surgical Hx: The patient  has a past surgical history that includes Gastric bypass; Cardiac catheterization (09/2014); A-FLUTTER ABLATION (N/A, 10/18/2017); LEFT HEART CATH AND CORONARY ANGIOGRAPHY (N/A, 02/22/2018); prostate radioactive seeds implant 2009; Coronary artery bypass graft (N/A, 03/20/2018); Radial artery harvest (N/A, 03/20/2018); and TEE without cardioversion (N/A, 03/20/2018).   Current Medications: Current Meds  Medication Sig  . acetaminophen (TYLENOL) 500 MG tablet Take 1,000 mg by mouth every 8 (eight) hours as needed (knee pain).   Marland Kitchen albuterol (PROVENTIL HFA;VENTOLIN HFA) 108 (90 Base) MCG/ACT inhaler Inhale 2 puffs into the lungs every 6 (six) hours as needed for wheezing or shortness of breath.  Marland Kitchen aspirin EC 325 MG EC tablet Take 1 tablet (325 mg total) by mouth daily.  . COLLAGEN PO Take 2 capsules by mouth daily.  . ferrous sulfate 325 (65 FE) MG EC tablet Take 1 tablet (325 mg total) by mouth daily with breakfast.  . fluticasone (FLONASE) 50 MCG/ACT nasal spray Place 2 sprays into both nostrils daily as needed for allergies.   Marland Kitchen irbesartan (AVAPRO) 75 MG tablet Take 1 tablet (75 mg total)  by mouth daily.  . metFORMIN (GLUCOPHAGE) 500 MG tablet Take 1,000 mg by mouth 2 (two) times daily.   . metoprolol tartrate (LOPRESSOR) 50 MG tablet Take 0.5 tablets (25 mg total) by mouth 2 (two) times daily.  . Multiple Vitamins-Minerals (MULTIVITAMIN WITH MINERALS) tablet Take 1 tablet by mouth daily.  . Omega-3 Fatty Acids (FISH OIL) 1000 MG CAPS Take 1,000 mg by mouth daily.  Vladimir Faster Glycol-Propyl Glycol (LUBRICANT EYE DROPS) 0.4-0.3 % SOLN Place 1 drop into both eyes 3 (three) times daily as needed (for dry/irritated eyes.).  Marland Kitchen pramipexole (MIRAPEX) 0.125 MG tablet Take 0.125 mg by mouth at bedtime.  . sodium fluoride (PREVIDENT 5000 PLUS) 1.1 % CREA dental cream Take 1 application by mouth 2 (two) times daily.   . tamsulosin (FLOMAX) 0.4 MG CAPS capsule Take 0.4 mg by mouth at bedtime.   . [DISCONTINUED] rosuvastatin (CRESTOR) 10 MG tablet Take 10 mg by mouth every evening.     Allergies:   Tape and Tramadol   Social History   Tobacco  Use  . Smoking status: Never Smoker  . Smokeless tobacco: Never Used  Substance Use Topics  . Alcohol use: Yes    Comment: occ  . Drug use: Never     Family Hx: The patient's family history includes Cancer in his father; Heart attack in his mother; Heart disease in his mother; Heart failure in his mother; Hypertension in his maternal grandmother and mother; Kidney failure in his father; Stroke in his father.  ROS:   Please see the history of present illness.    ROS All other systems reviewed and are negative.   EKGs/Labs/Other Test Reviewed:    EKG:  EKG is  ordered today.  The ekg ordered today demonstrates normal sinus rhythm, HR 65, LAD, low voltage, TW inversions 1, aVL, PRWP, QTc 490  Recent Labs: 09/16/2017: TSH 1.622 03/16/2018: ALT 20 03/21/2018: Magnesium 1.9 03/22/2018: BUN 12; Creatinine, Ser 0.90; Hemoglobin 12.0; Platelets 166; Potassium 4.0; Sodium 135   Recent Lipid Panel Lab Results  Component Value Date/Time    CHOL 141 06/05/2015 08:55 AM   TRIG 65 06/05/2015 08:55 AM   HDL 47 06/05/2015 08:55 AM   CHOLHDL 3.0 06/05/2015 08:55 AM   LDLCALC 81 06/05/2015 08:55 AM   Labs 01/19/18  Ref Range   Cholesterol, Total 151 100 - 199 mg/dL  Triglycerides 80 0 - 149 mg/dL  HDL 61 >39 mg/dL  VLDL Cholesterol Cal 16 5 - 40 mg/dL  LDL 74 0 - 99 mg/dL    Physical Exam:    VS:  BP 112/60   Pulse 65   Ht 6' 3"  (1.905 m)   Wt (!) 328 lb (148.8 kg)   SpO2 98%   BMI 41.00 kg/m     Wt Readings from Last 3 Encounters:  04/07/18 (!) 328 lb (148.8 kg)  03/24/18 (!) 330 lb 11 oz (150 kg)  03/16/18 (!) 336 lb 4.8 oz (152.5 kg)     Physical Exam  Constitutional: He is oriented to person, place, and time. He appears well-developed and well-nourished. No distress.  HENT:  Head: Normocephalic and atraumatic.  Eyes: No scleral icterus.  Neck: No JVD present. No thyromegaly present.  Cardiovascular: Normal rate and regular rhythm.  No murmur heard. Pulmonary/Chest: Effort normal. He has no wheezes. He has no rales.  Median sternotomy well healed without discharge.  Abdominal: Soft.  Musculoskeletal:        General: No edema.  Lymphadenopathy:    He has no cervical adenopathy.  Neurological: He is alert and oriented to person, place, and time.  Skin: Skin is warm and dry.  Psychiatric: He has a normal mood and affect.    ASSESSMENT & PLAN:    Coronary artery disease involving native coronary artery of native heart without angina pectoris He returns for follow-up after recent CABG.  He is progressing well since surgery.  He is maintaining normal sinus rhythm.  Continue high-dose aspirin for 90 days post CABG.  Continue beta-blocker, statin.  Arrange follow-up with Dr. Burt Knack in 3 months.  I have asked him to contact us if he does not hear from cardiac rehabilitation after he sees Dr. Cyndia Bent.  Essential hypertension The patient's blood pressure is controlled on his current regimen.  Continue current  therapy.   Hyperlipidemia, unspecified hyperlipidemia type  Goal LDL is <70.  Increase rosuvastatin to 20 mg daily.  Arrange follow-up lipids and LFTs in 3 months.  Type 2 diabetes mellitus with complication, without long-term current use of insulin ([place])  Managed  by primary care.  Empagliflozin could be considered given the results of the EMPA-REG OUTCOME trial.   Atrial flutter, unspecified type [facility])  Status post ablation.  He is currently maintaining normal sinus rhythm.  Dispo:  Return in about 3 months (around 07/07/2018) for Routine Follow Up, w/ Dr. Burt Knack.   Medication Adjustments/Labs and Tests Ordered: Current medicines are reviewed at length with the patient today.  Concerns regarding medicines are outlined above.  Tests Ordered: Orders Placed This Encounter  Procedures  . Hepatic function panel  . Lipid panel  . EKG 12-Lead   Medication Changes: Meds ordered this encounter  Medications  . rosuvastatin (CRESTOR) 20 MG tablet    Sig: Take 1 tablet (20 mg total) by mouth daily.    Dispense:  90 tablet    Refill:  3    Signed, Garrett Dopp, PA-C  04/07/2018 9:45 AM    [address], [place], [place]  [address] Phone: [phone]; Fax: [phone]

## 2018-04-07 ENCOUNTER — Ambulatory Visit (INDEPENDENT_AMBULATORY_CARE_PROVIDER_SITE_OTHER): Payer: BLUE CROSS/BLUE SHIELD | Admitting: Physician Assistant

## 2018-04-07 ENCOUNTER — Encounter: Payer: Self-pay | Admitting: Physician Assistant

## 2018-04-07 VITALS — BP 112/60 | HR 65 | Ht 75.0 in | Wt 328.0 lb

## 2018-04-07 DIAGNOSIS — E118 Type 2 diabetes mellitus with unspecified complications: Secondary | ICD-10-CM

## 2018-04-07 DIAGNOSIS — I2510 Atherosclerotic heart disease of native coronary artery without angina pectoris: Secondary | ICD-10-CM | POA: Diagnosis not present

## 2018-04-07 DIAGNOSIS — E785 Hyperlipidemia, unspecified: Secondary | ICD-10-CM

## 2018-04-07 DIAGNOSIS — I4892 Unspecified atrial flutter: Secondary | ICD-10-CM

## 2018-04-07 DIAGNOSIS — I10 Essential (primary) hypertension: Secondary | ICD-10-CM | POA: Diagnosis not present

## 2018-04-07 MED ORDER — ROSUVASTATIN CALCIUM 20 MG PO TABS: 20.0000 mg | ORAL_TABLET | Freq: Every day | ORAL | 3 refills | Status: DC

## 2018-04-07 NOTE — Patient Instructions (Addendum)
Medication Instructions:  Your physician has recommended you make the following change in your medication:  1. INCREASE CRESTOR TO 20 MG DAILY.  If you need a refill on your cardiac medications before your next appointment, please call your pharmacy.   Lab work: TO BE DONE IN 3 MONTHS: LFTS , LIPIDS If you have labs (blood work) drawn today and your tests are completely normal, you will receive your results only by: Marland Kitchen MyChart Message (if you have MyChart) OR . A paper copy in the mail If you have any lab test that is abnormal or we need to change your treatment, we will call you to review the results.  Testing/Procedures: NONE  Follow-Up: At [facility], you and your health needs are our priority.  As part of our continuing mission to provide you with exceptional heart care, we have created designated Provider Care Teams.  These Care Teams include your primary Cardiologist (physician) and Advanced Practice Providers (APPs -  Physician Assistants and Nurse Practitioners) who all work together to provide you with the care you need, when you need it. You will need a follow up appointment in:  3 months.   You may see Sherren Mocha, MD or one of the following Advanced Practice Providers on your designated Care Team: Richardson Dopp, PA-C [place], [place] . Daune Perch, NP  Any Other Special Instructions Will Be Listed Below (If Applicable).

## 2018-04-11 NOTE — Telephone Encounter (Signed)
Called patient to see if he was interested in participating in the Cardiac Rehab Program. Patient stated yes. Patient will come in for orientation on 05/11/2018 @ 8AM and will attend the 645AM exercise class.  Mailed homework package.  Went over insurance, patient verbalized understanding.

## 2018-04-13 ENCOUNTER — Other Ambulatory Visit (HOSPITAL_COMMUNITY): Payer: Self-pay | Admitting: Physician Assistant

## 2018-04-17 ENCOUNTER — Other Ambulatory Visit: Payer: Self-pay | Admitting: Surgery

## 2018-04-17 DIAGNOSIS — Z951 Presence of aortocoronary bypass graft: Secondary | ICD-10-CM

## 2018-04-24 ENCOUNTER — Other Ambulatory Visit: Payer: Self-pay

## 2018-04-24 ENCOUNTER — Ambulatory Visit
Admission: RE | Admit: 2018-04-24 | Discharge: 2018-04-24 | Disposition: A | Discharge status: Home / Self Care | Payer: BLUE CROSS/BLUE SHIELD | Source: Ambulatory Visit | Attending: Surgery | Admitting: Surgery

## 2018-04-24 ENCOUNTER — Ambulatory Visit (INDEPENDENT_AMBULATORY_CARE_PROVIDER_SITE_OTHER): Payer: Self-pay | Admitting: Surgical

## 2018-04-24 VITALS — BP 127/63 | HR 49 | Resp 16 | Ht 75.0 in | Wt 324.0 lb

## 2018-04-24 DIAGNOSIS — Z951 Presence of aortocoronary bypass graft: Secondary | ICD-10-CM

## 2018-04-24 DIAGNOSIS — I2510 Atherosclerotic heart disease of native coronary artery without angina pectoris: Secondary | ICD-10-CM

## 2018-04-24 NOTE — Progress Notes (Signed)
[address]       [place] [address]             [phone]      Timon Newton  III [place] Medical Record #[id number] Date of Birth: 1950/10/24  Referring: Sherren Mocha, MD Primary Care: Tempie Hoist, MD Primary Cardiologist: Sherren Mocha, MD   Chief Complaint:   POST OP FOLLOW UP                                                                                             CARDIOVASCULAR SURGERY OPERATIVE NOTE  03/20/2018  Surgeon:  [name], MD  First Assistant: Nicholes Rough,  PA-C   Preoperative Diagnosis:  Severe multi-vessel coronary artery disease   Postoperative Diagnosis:  Same   Procedure:  1. Median Sternotomy 2. Extracorporeal circulation 3.   Coronary artery bypass grafting x 2   Left internal mammary artery graft to the LAD  Free right internal mammary artery graft to the diagonal  4.   Repair of sternal fracture with KLS Martin locking bone plate   Anesthesia:  General Endotracheal   History of Present Illness:    The patient is a 67 year old male status post the above described procedure seen in the office on today's date and routine postsurgical follow-up.  Overall he is doing quite well.  He denies any difficulties currently with pain.  He does tire somewhat with activity but is variable and improving over time.  He is not having any chest pain or anginal equivalents.  He has no significant shortness of breath with routine activity.  He denies fevers, chills or other significant constitutional symptoms.  Overall he is quite pleased with his progress.      Past Medical History:  Diagnosis Date  . Atrial flutter ([facility]) 09/16/2017   S/p RFCA // EF was 30% in setting of AFl w/ RVR // EF improved in NSR (Echo 8/19: Mod conc LVH, EF 50-55, Gr 1 DD, mild dilated ascending aorta (40 mm), MAC, trivial TR)  . CAD (coronary artery disease) 04/06/2018   LHC 10/19: dLM 40-50, LAD 100 CTO, D1  99, LCx 99, RI 60; R-L collats, EF normal // s/p CABG 11/19 (L-LAD, RIMA-Dx)  . Cancer ([facility])   . Diabetes mellitus without complication ([facility])   . HTN (hypertension)   . Hyperlipidemia LDL goal <70   . OA (osteoarthritis)    right knee  . PAC (premature atrial contraction)   . Pneumonia    walking pneumonia--yrs. ago  . Prostate cancer ([facility])   . Sleep apnea      Social History   Tobacco Use  Smoking Status Never Smoker  Smokeless Tobacco Never Used    Social History   Substance and Sexual Activity  Alcohol Use Yes   Comment: occ     Allergies  Allergen Reactions  . Tape Rash    With prolonged use--blisters  . Tramadol Other (See Comments)    Skin irritation    Current Outpatient Medications  Medication Sig Dispense Refill  . acetaminophen (TYLENOL) 500 MG tablet Take 1,000 mg  by mouth every 8 (eight) hours as needed (knee pain).     Marland Kitchen albuterol (PROVENTIL HFA;VENTOLIN HFA) 108 (90 Base) MCG/ACT inhaler Inhale 2 puffs into the lungs every 6 (six) hours as needed for wheezing or shortness of breath.    Marland Kitchen aspirin EC 325 MG EC tablet Take 1 tablet (325 mg total) by mouth daily. 30 tablet 0  . COLLAGEN PO Take 2 capsules by mouth daily.    . ferrous sulfate 325 (65 FE) MG EC tablet Take 1 tablet (325 mg total) by mouth daily with breakfast.  3  . fluticasone (FLONASE) 50 MCG/ACT nasal spray Place 2 sprays into both nostrils daily as needed for allergies.     Marland Kitchen irbesartan (AVAPRO) 75 MG tablet Take 1 tablet (75 mg total) by mouth daily. 90 tablet 3  . metFORMIN (GLUCOPHAGE) 500 MG tablet Take 1,000 mg by mouth 2 (two) times daily.   9  . metoprolol tartrate (LOPRESSOR) 50 MG tablet Take 0.5 tablets (25 mg total) by mouth 2 (two) times daily. 60 tablet 6  . Multiple Vitamins-Minerals (MULTIVITAMIN WITH MINERALS) tablet Take 1 tablet by mouth daily.    . Omega-3 Fatty Acids (FISH OIL) 1000 MG CAPS Take 1,000 mg by mouth daily.    Vladimir Faster Glycol-Propyl Glycol (LUBRICANT  EYE DROPS) 0.4-0.3 % SOLN Place 1 drop into both eyes 3 (three) times daily as needed (for dry/irritated eyes.).    Marland Kitchen pramipexole (MIRAPEX) 0.125 MG tablet Take 0.125 mg by mouth at bedtime.  2  . rosuvastatin (CRESTOR) 20 MG tablet Take 1 tablet (20 mg total) by mouth daily. 90 tablet 3  . sodium fluoride (PREVIDENT 5000 PLUS) 1.1 % CREA dental cream Take 1 application by mouth 2 (two) times daily.     . tamsulosin (FLOMAX) 0.4 MG CAPS capsule Take 0.4 mg by mouth at bedtime.   3   No current facility-administered medications for this visit.        Physical Exam: BP 127/63 (BP Location: Right Arm, Patient Position: Sitting, Cuff Size: Large)   Pulse (!) 49   Resp 16   Ht _0  (1.905 m)   Wt (!) 147 kg   SpO2 97% Comment: ON RA  BMI 40.50 kg/m   General appearance: alert, cooperative and no distress Heart: regular rate and rhythm Lungs: clear to auscultation bilaterally Abdomen: Benign exam Extremities: venous stasis dermatitis noted Wound: Incision healing well without evidence of infection.   Diagnostic Studies & Laboratory data:     Recent Radiology Findings:   Dg Chest 2 View  Result Date: 04/24/2018 CLINICAL DATA:  Recent CABG. EXAM: CHEST - 2 VIEW COMPARISON:  Chest x-ray dated March 22, 2018. FINDINGS: The heart size and mediastinal contours are within normal limits. Prior CABG. Normal pulmonary vascularity. Trace left pleural effusion. No consolidation or pneumothorax. No acute osseous abnormality. IMPRESSION: Trace left pleural effusion. Electronically Signed   By: Titus Dubin M.D.   On: 04/24/2018 10:51      Recent Lab Findings: Lab Results  Component Value Date   WBC 12.3 (H) 03/22/2018   HGB 12.0 (L) 03/22/2018   HCT 36.7 (L) 03/22/2018   PLT 166 03/22/2018   GLUCOSE 152 (H) 03/22/2018   CHOL 141 06/05/2015   TRIG 65 06/05/2015   HDL 47 06/05/2015   LDLCALC 81 06/05/2015   ALT 20 03/16/2018   AST 20 03/16/2018   NA 135 03/22/2018   K 4.0  03/22/2018   CL 102 03/22/2018  CREATININE 0.90 03/22/2018   BUN 12 03/22/2018   CO2 26 03/22/2018   TSH 1.622 09/16/2017   INR 1.51 03/20/2018   HGBA1C 6.4 (H) 03/16/2018      Assessment / Plan: The patient is doing quite well.  There are no current surgically related issues.  We did have a long discussion on nutritional management of diabetes.  We also discussed activity progression and driving restrictions per usual protocols.  He has good understanding of our discussion.  We will see him again on a as needed basis for any surgically related issues or at request.  We did not make any changes to his current medication regimen.          John Giovanni, PA-C 04/24/2018 11:35 AM

## 2018-04-24 NOTE — Patient Instructions (Signed)
Discussed activity progression and driving protocols.

## 2018-04-26 ENCOUNTER — Emergency Department (INDEPENDENT_AMBULATORY_CARE_PROVIDER_SITE_OTHER)
Admission: EM | Admit: 2018-04-26 | Discharge: 2018-04-26 | Disposition: A | Discharge status: Home / Self Care | Payer: BLUE CROSS/BLUE SHIELD | Source: Home / Self Care | Attending: Internal Medicine | Admitting: Internal Medicine

## 2018-04-26 ENCOUNTER — Encounter: Payer: Self-pay | Admitting: Emergency Medicine

## 2018-04-26 DIAGNOSIS — R69 Illness, unspecified: Secondary | ICD-10-CM | POA: Diagnosis not present

## 2018-04-26 DIAGNOSIS — J111 Influenza due to unidentified influenza virus with other respiratory manifestations: Secondary | ICD-10-CM

## 2018-04-26 LAB — POCT INFLUENZA A/B
Influenza A, POC: NEGATIVE
Influenza B, POC: NEGATIVE

## 2018-04-26 NOTE — ED Provider Notes (Addendum)
[name]    CSN: [id number] Arrival date & time: 04/26/18  1904     History   Chief Complaint Chief Complaint  Patient presents with  . Fever    HPI Garrett Welch is a 68 y.o. male.   68 year old male with diabetes and coronary artery disease status post CABG in November 2019 presents to the emergency department with fever of 101 F.  The patient reports that his fever began today and is associated with some chills and body aches.  He is not wanting to eat much.  The patient also complains of epigastric pain associated with nausea.  He has never had heartburn the severe.  The pain radiates to his back.  He denies shortness of breath but he is very diaphoretic.     Past Medical History:  Diagnosis Date  . Atrial flutter ([facility]) 09/16/2017   S/p RFCA // EF was 30% in setting of AFl w/ RVR // EF improved in NSR (Echo 8/19: Mod conc LVH, EF 50-55, Gr 1 DD, mild dilated ascending aorta (40 mm), MAC, trivial TR)  . CAD (coronary artery disease) 04/06/2018   LHC 10/19: dLM 40-50, LAD 100 CTO, D1 99, LCx 99, RI 60; R-L collats, EF normal // s/p CABG 11/19 (L-LAD, RIMA-Dx)  . Cancer ([facility])   . Diabetes mellitus without complication ([facility])   . HTN (hypertension)   . Hyperlipidemia LDL goal <70   . OA (osteoarthritis)    right knee  . PAC (premature atrial contraction)   . Pneumonia    walking pneumonia--yrs. ago  . Prostate cancer ([facility])   . Sleep apnea     Patient Active Problem List   Diagnosis Date Noted  . CAD (coronary artery disease) 04/06/2018  . 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 Abnormal nuclear cardiac imaging test 02/22/2018  . Atrial flutter ([facility]) 09/16/2017  . Hypotension 09/16/2017  . HTN (hypertension)   . S/P gastric bypass 01/04/2012    Past Surgical History:  Procedure Laterality Date  . A-FLUTTER ABLATION N/A 10/18/2017   Procedure: A-FLUTTER ABLATION;  Surgeon: Evans Lance, MD;  Location: [facility];  Service: Cardiovascular;   Laterality: N/A;  . 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 CORONARY ARTERY BYPASS GRAFT N/A 03/20/2018   Procedure: CORONARY ARTERY BYPASS GRAFTING (CABG)  times two using bilateral internal mammary arteries. Plating of sternal fracture.;  Surgeon: [name], MD;  Location: [facility];  Service: Open Heart Surgery;  Laterality: N/A;  with bilateral IMA  . GASTRIC BYPASS     20 yrs. ago  . LEFT HEART CATH AND CORONARY ANGIOGRAPHY N/A 02/22/2018   Procedure: LEFT HEART CATH AND CORONARY ANGIOGRAPHY;  Surgeon: Sherren Mocha, MD;  Location: [facility];  Service: Cardiovascular;  Laterality: N/A;  . prostate radioactive seeds implant 2009    . RADIAL ARTERY HARVEST N/A 03/20/2018   Procedure: possible RADIAL ARTERY HARVEST;  Surgeon: [name], MD;  Location: [facility];  Service: Open Heart Surgery;  Laterality: N/A;  . TEE WITHOUT CARDIOVERSION N/A 03/20/2018   Procedure: TRANSESOPHAGEAL ECHOCARDIOGRAM (TEE);  Surgeon: [name], MD;  Location: [facility];  Service: Open Heart Surgery;  Laterality: N/A;       Home Medications    Prior to Admission medications   Medication Sig Start Date End Date Taking? Authorizing Provider  acetaminophen (TYLENOL) 500 MG tablet Take 1,000 mg by  mouth every 8 (eight) hours as needed (knee pain).     [provider]  albuterol (PROVENTIL HFA;VENTOLIN HFA) 108 (90 Base) MCG/ACT inhaler Inhale 2 puffs into the lungs every 6 (six) hours as needed for wheezing or shortness of breath.    [provider]  aspirin EC 325 MG EC tablet Take 1 tablet (325 mg total) by mouth daily. 03/24/18   Nani Skillern, PA-C  COLLAGEN PO Take 2 capsules by mouth daily.    [provider]  ferrous sulfate 325 (65 FE) MG EC tablet Take 1 tablet (325 mg total) by mouth daily with breakfast. 03/24/18   Lars Pinks M, PA-C  fluticasone (FLONASE) 50 MCG/ACT nasal spray Place 2  sprays into both nostrils daily as needed for allergies.     [provider]  irbesartan (AVAPRO) 75 MG tablet Take 1 tablet (75 mg total) by mouth daily. 11/30/17   Evans Lance, MD  metFORMIN (GLUCOPHAGE) 500 MG tablet Take 1,000 mg by mouth 2 (two) times daily.  09/16/14   [provider]  metoprolol tartrate (LOPRESSOR) 50 MG tablet Take 0.5 tablets (25 mg total) by mouth 2 (two) times daily. 10/20/17   Sherran Needs, NP  Multiple Vitamins-Minerals (MULTIVITAMIN WITH MINERALS) tablet Take 1 tablet by mouth daily.    [provider]  Omega-3 Fatty Acids (FISH OIL) 1000 MG CAPS Take 1,000 mg by mouth daily.    [provider]  Polyethyl Glycol-Propyl Glycol (LUBRICANT EYE DROPS) 0.4-0.3 % SOLN Place 1 drop into both eyes 3 (three) times daily as needed (for dry/irritated eyes.).    [provider]  pramipexole (MIRAPEX) 0.125 MG tablet Take 0.125 mg by mouth at bedtime. 08/27/14   [provider]  rosuvastatin (CRESTOR) 20 MG tablet Take 1 tablet (20 mg total) by mouth daily. 04/07/18 07/06/18  Richardson Dopp T, PA-C  sodium fluoride (PREVIDENT 5000 PLUS) 1.1 % CREA dental cream Take 1 application by mouth 2 (two) times daily.  07/01/14   [provider]  tamsulosin (FLOMAX) 0.4 MG CAPS capsule Take 0.4 mg by mouth at bedtime.  09/18/14   [provider]    Family History Family History  Problem Relation Age of Onset  . Heart failure Mother   . Heart disease Mother   . Heart attack Mother   . Hypertension Mother   . Stroke Father   . Kidney failure Father   . Cancer Father   . Hypertension Maternal Grandmother     Social History Social History   Tobacco Use  . Smoking status: Never Smoker  . Smokeless tobacco: Never Used  Substance Use Topics  . Alcohol use: Yes    Comment: occ  . Drug use: Never     Allergies   Tape and Tramadol   Review of Systems Review of Systems  Constitutional: Positive for  diaphoresis and fever. Negative for chills.  HENT: Negative for sore throat and tinnitus.   Eyes: Negative for redness.  Respiratory: Negative for cough and shortness of breath.   Cardiovascular: Negative for chest pain and palpitations.  Gastrointestinal: Positive for abdominal pain and nausea. Negative for diarrhea and vomiting.  Genitourinary: Negative for dysuria, frequency and urgency.  Musculoskeletal: Negative for myalgias.  Skin: Negative for rash.       No lesions  Neurological: Negative for weakness.  Hematological: Does not bruise/bleed easily.  Psychiatric/Behavioral: Negative for suicidal ideas.     Physical Exam Triage Vital Signs ED Triage Vitals  Enc Vitals Group     BP 04/26/18 1927 112/73     Pulse Rate 04/26/18 1927 78     Resp --      Temp 04/26/18 1927 (!) 101 F (38.3 C)     Temp Source 04/26/18 1927 Oral     SpO2 04/26/18 1927 96 %     Weight 04/26/18 1928 (!) 324 lb (147 kg)     Height --      Head Circumference --      Peak Flow --      Pain Score 04/26/18 1928 2     Pain Loc --      Pain Edu? --      Excl. in [place]? --    No data found.  Updated Vital Signs BP 112/73 (BP Location: Right Arm)   Pulse 78   Temp (!) 101 F (38.3 C) (Oral)   Wt (!) 147 kg   SpO2 96%   BMI 40.50 kg/m   Visual Acuity Right Eye Distance:   Left Eye Distance:   Bilateral Distance:    Right Eye Near:   Left Eye Near:    Bilateral Near:     Physical Exam Constitutional:      General: He is not in acute distress.    Appearance: He is well-developed. He is diaphoretic.  HENT:     Head: Normocephalic and atraumatic.  Eyes:     General: No scleral icterus.    Conjunctiva/sclera: Conjunctivae normal.     Pupils: Pupils are equal, round, and reactive to light.  Neck:     Musculoskeletal: Normal range of motion and neck supple.     Thyroid: No thyromegaly.     Vascular: No JVD.     Trachea: No tracheal deviation.  Cardiovascular:     Rate and Rhythm:  Normal rate and regular rhythm.     Heart sounds: Normal heart sounds. No murmur. No friction rub. No gallop.   Pulmonary:     Effort: Pulmonary effort is normal. No respiratory distress.     Breath sounds: Normal breath sounds.  Abdominal:     General: Bowel sounds are normal. There is no distension.     Palpations: Abdomen is soft.     Tenderness: There is no abdominal tenderness.  Musculoskeletal: Normal range of motion.  Lymphadenopathy:     Cervical: No cervical adenopathy.  Skin:    General: Skin is warm.     Findings: No erythema or rash.  Neurological:     Mental Status: He is alert and oriented to person, place, and time.     Cranial Nerves: No cranial nerve deficit.  Psychiatric:        Behavior: Behavior normal.        Thought Content: Thought content normal.        Judgment: Judgment normal.      UC Treatments / Results  Labs (all labs ordered are listed, but only abnormal results are displayed) Labs Reviewed  POCT INFLUENZA A/B    EKG None  Radiology No results found.  Procedures Procedures (including critical care time)  Medications Ordered in UC Medications - No data to display  Initial Impression / Assessment and Plan / UC Course  I have reviewed the triage vital signs and the nursing notes.  Pertinent labs & imaging results that were available during my care of the patient were reviewed by me and considered in my medical decision making (see chart for details).  Rapid influenza negative.  Likely flulike illness.  The patient has taken Tylenol.  He is well-hydrated at this time but I have encouraged him to drink plenty of fluids.  EKG shows no signs of ischemia but there is some noise at baseline that is likely due to shivering.  The patient also has a prolonged QTC he is not on any QT prolonged gating medications.  EKG compared to EKG on 11/26.  Epigastric symptoms are more consistent with GERD than dissection or ACS.  Blood pressure and heart  rate are normal which support this conclusion.  Patient recently began taking aspirin 325 but he reports taking this with food.  Prilosec is helping which indicates pain is less likely cardiac.  Patient to follow-up with cardiology as well as primary care.  His cardiothoracic surgeon Dr. Roger Shelter has been notified  Final Clinical Impressions(s) / UC Diagnoses   Final diagnoses:  Influenza-like illness   Discharge Instructions   None    ED Prescriptions    None     Controlled Substance Prescriptions [place] Controlled Substance Registry consulted? Not Applicable   Harrie Foreman, MD 04/26/18 2039    Harrie Foreman, MD 04/26/18 2040

## 2018-04-26 NOTE — ED Triage Notes (Signed)
Pt states he started running a 102 temp today suddenly. Denies feeling bad other than some epigasrtic pain which he think is heartburn. He did have heart surgery done x1 month ago. sweating and temp now.

## 2018-04-27 ENCOUNTER — Telehealth: Payer: Self-pay | Admitting: Emergency Medicine

## 2018-04-27 ENCOUNTER — Telehealth: Payer: Self-pay

## 2018-04-27 NOTE — Telephone Encounter (Signed)
Feeling better.  Fever has gone down, epigastric pain is better.  Is following up with Internal medicine physician.

## 2018-04-28 ENCOUNTER — Other Ambulatory Visit: Payer: Self-pay | Admitting: Cardiovascular Disease

## 2018-05-03 ENCOUNTER — Telehealth (HOSPITAL_COMMUNITY): Payer: Self-pay

## 2018-05-03 NOTE — Telephone Encounter (Signed)
Cardiac Rehab Medication Review by a Pharmacist  Does the patient  feel that his/her medications are working for him/her?  yes  Has the patient been experiencing any side effects to the medications prescribed?  no  Does the patient measure his/her own blood pressure or blood glucose at home?  yes  BP: 130/70s Glucose: 90-135  Does the patient have any problems obtaining medications due to transportation or finances?   no  Understanding of regimen: excellent Understanding of indications: excellent Potential of compliance: excellent  Pharmacist comments: Patient was having GI side effects with aspirin 325 mg daily so was instructed by MD to decrease to aspirin 81 mg daily with a meal  Vertis Kelch, PharmD PGY1 Pharmacy Resident Phone [phone] 05/03/2018       11:30 AM

## 2018-05-08 NOTE — Progress Notes (Signed)
Garrett Welch 68 y.o. male DOB 07-15-1950 MRN [id number]       Nutrition  No diagnosis found. Past Medical History:  Diagnosis Date  . Atrial flutter ([place]) 09/16/2017   S/p RFCA // EF was 30% in setting of AFl w/ RVR // EF improved in NSR (Echo 8/19: Mod conc LVH, EF 50-55, Gr 1 DD, mild dilated ascending aorta (40 mm), MAC, trivial TR)  . CAD (coronary artery disease) 04/06/2018   LHC 10/19: dLM 40-50, LAD 100 CTO, D1 99, LCx 99, RI 60; R-L collats, EF normal // s/p CABG 11/19 (L-LAD, RIMA-Dx)  . Cancer ([place] [facility])   . Diabetes mellitus without complication ([facility])   . HTN (hypertension)   . Hyperlipidemia LDL goal <70   . OA (osteoarthritis)    right knee  . PAC (premature atrial contraction)   . Pneumonia    walking pneumonia--yrs. ago  . Prostate cancer ([facility])   . Sleep apnea    Meds reviewed.    Current Outpatient Medications (Endocrine & Metabolic):  .  metFORMIN (GLUCOPHAGE) 500 MG tablet, Take 1,000 mg by mouth 2 (two) times daily.   Current Outpatient Medications (Cardiovascular):  .  irbesartan (AVAPRO) 75 MG tablet, Take 1 tablet (75 mg total) by mouth daily. .  metoprolol tartrate (LOPRESSOR) 50 MG tablet, Take 0.5 tablets (25 mg total) by mouth 2 (two) times daily. .  rosuvastatin (CRESTOR) 20 MG tablet, Take 1 tablet (20 mg total) by mouth daily. (Patient taking differently: Take 40 mg by mouth daily. )  Current Outpatient Medications (Respiratory):  .  albuterol (PROVENTIL HFA;VENTOLIN HFA) 108 (90 Base) MCG/ACT inhaler, Inhale 2 puffs into the lungs every 6 (six) hours as needed for wheezing or shortness of breath. .  fluticasone (FLONASE) 50 MCG/ACT nasal spray, Place 2 sprays into both nostrils daily as needed for allergies.   Current Outpatient Medications (Analgesics):  .  acetaminophen (TYLENOL) 500 MG tablet, Take 1,000 mg by mouth every 8 (eight) hours as needed (knee pain).  Marland Kitchen  aspirin EC 325 MG EC tablet, Take 1 tablet (325 mg total) by mouth daily.  (Patient not taking: Reported on 05/03/2018) .  aspirin EC 81 MG tablet, Take 81 mg by mouth daily.  Current Outpatient Medications (Hematological):  .  ferrous sulfate 325 (65 FE) MG EC tablet, Take 1 tablet (325 mg total) by mouth daily with breakfast.  Current Outpatient Medications (Other):  Marland Kitchen  COLLAGEN PO, Take 2 capsules by mouth daily. .  Multiple Vitamins-Minerals (MULTIVITAMIN WITH MINERALS) tablet, Take 1 tablet by mouth daily. .  Omega-3 Fatty Acids (FISH OIL) 1000 MG CAPS, Take 1,000 mg by mouth daily. Vladimir Faster Glycol-Propyl Glycol (LUBRICANT EYE DROPS) 0.4-0.3 % SOLN, Place 1 drop into both eyes 3 (three) times daily as needed (for dry/irritated eyes.). Marland Kitchen  pramipexole (MIRAPEX) 0.125 MG tablet, Take 0.125 mg by mouth at bedtime. .  sodium fluoride (PREVIDENT 5000 PLUS) 1.1 % CREA dental cream, Take 1 application by mouth 2 (two) times daily.  .  tamsulosin (FLOMAX) 0.4 MG CAPS capsule, Take 0.4 mg by mouth at bedtime.    HT: Ht Readings from Last 1 Encounters:  04/24/18 _0  (1.905 m)    WT: Wt Readings from Last 5 Encounters:  04/26/18 (!) 324 lb (147 kg)  04/24/18 (!) 324 lb (147 kg)  04/07/18 (!) 328 lb (148.8 kg)  03/24/18 (!) 330 lb 11 oz (150 kg)  03/16/18 (!) 336 lb 4.8 oz (152.5 kg)  BMI = 04/26/18   Current tobacco use? No       Labs:  Lipid Panel     Component Value Date/Time   CHOL 141 06/05/2015 0855   TRIG 65 06/05/2015 0855   HDL 47 06/05/2015 0855   CHOLHDL 3.0 06/05/2015 0855   VLDL 13 06/05/2015 0855   LDLCALC 81 06/05/2015 0855    Lab Results  Component Value Date   HGBA1C 6.4 (H) 03/16/2018   CBG (last 3)  No results for input(s): GLUCAP in the last 72 hours.  Nutrition Diagnosis ? Food-and nutrition-related knowledge deficit related to lack of exposure to information as related to diagnosis of: ? CVD ? Type 2 Diabetes  Nutrition Goal(s):  ? To be determined  Plan:  Pt to attend nutrition classes ? Nutrition I ? Nutrition  II ? Portion Distortion  ? Diabetes Blitz ? Diabetes Q & A Will provide client-centered nutrition education as part of interdisciplinary care.   Monitor and evaluate progress toward nutrition goal with team.  Laurina Bustle, MS, RD, LDN 05/08/2018 10:18 AM

## 2018-05-11 ENCOUNTER — Encounter (HOSPITAL_COMMUNITY)
Admission: RE | Admit: 2018-05-11 | Discharge: 2018-05-11 | Disposition: A | Discharge status: Home / Self Care | Payer: BLUE CROSS/BLUE SHIELD | Source: Ambulatory Visit | Attending: Cardiovascular Disease | Admitting: Cardiovascular Disease

## 2018-05-11 ENCOUNTER — Encounter (HOSPITAL_COMMUNITY): Payer: Self-pay

## 2018-05-11 VITALS — BP 124/74 | HR 62 | Ht 74.25 in | Wt 323.6 lb

## 2018-05-11 DIAGNOSIS — Z951 Presence of aortocoronary bypass graft: Secondary | ICD-10-CM | POA: Insufficient documentation

## 2018-05-11 NOTE — Progress Notes (Addendum)
Cardiac Individual Treatment Plan  Patient Details  Name: Garrett Welch MRN: [id number] Date of Birth: 09-Feb-1951 Referring Provider:     [place] from 05/11/2018 in [place]  Referring Provider  Sherren Mocha MD       Initial Encounter Date:    CARDIAC REHAB PHASE II ORIENTATION from 05/11/2018 in [place]  Date  05/11/18      Visit Diagnosis: S/P CABG x 2  Patient's Home Medications on Admission:  Current Outpatient Medications:  .  acetaminophen (TYLENOL) 500 MG tablet, Take 1,000 mg by mouth every 8 (eight) hours as needed (knee pain). , Disp: , Rfl:  .  albuterol (PROVENTIL HFA;VENTOLIN HFA) 108 (90 Base) MCG/ACT inhaler, Inhale 2 puffs into the lungs every 6 (six) hours as needed for wheezing or shortness of breath., Disp: , Rfl:  .  aspirin EC 325 MG EC tablet, Take 1 tablet (325 mg total) by mouth daily. (Patient not taking: Reported on 05/03/2018), Disp: 30 tablet, Rfl: 0 .  aspirin EC 81 MG tablet, Take 81 mg by mouth daily., Disp: , Rfl:  .  COLLAGEN PO, Take 2 capsules by mouth daily., Disp: , Rfl:  .  ferrous sulfate 325 (65 FE) MG EC tablet, Take 1 tablet (325 mg total) by mouth daily with breakfast., Disp: , Rfl: 3 .  fluticasone (FLONASE) 50 MCG/ACT nasal spray, Place 2 sprays into both nostrils daily as needed for allergies. , Disp: , Rfl:  .  irbesartan (AVAPRO) 75 MG tablet, Take 1 tablet (75 mg total) by mouth daily., Disp: 90 tablet, Rfl: 3 .  metFORMIN (GLUCOPHAGE) 500 MG tablet, Take 1,000 mg by mouth 2 (two) times daily. , Disp: , Rfl: 9 .  metoprolol tartrate (LOPRESSOR) 50 MG tablet, Take 0.5 tablets (25 mg total) by mouth 2 (two) times daily., Disp: 60 tablet, Rfl: 6 .  Multiple Vitamins-Minerals (MULTIVITAMIN WITH MINERALS) tablet, Take 1 tablet by mouth daily., Disp: , Rfl:  .  Omega-3 Fatty Acids (FISH OIL) 1000 MG CAPS, Take 1,000 mg by mouth daily.,  Disp: , Rfl:  .  Polyethyl Glycol-Propyl Glycol (LUBRICANT EYE DROPS) 0.4-0.3 % SOLN, Place 1 drop into both eyes 3 (three) times daily as needed (for dry/irritated eyes.)., Disp: , Rfl:  .  pramipexole (MIRAPEX) 0.125 MG tablet, Take 0.125 mg by mouth at bedtime., Disp: , Rfl: 2 .  rosuvastatin (CRESTOR) 20 MG tablet, Take 1 tablet (20 mg total) by mouth daily. (Patient taking differently: Take 40 mg by mouth daily. ), Disp: 90 tablet, Rfl: 3 .  sodium fluoride (PREVIDENT 5000 PLUS) 1.1 % CREA dental cream, Take 1 application by mouth 2 (two) times daily. , Disp: , Rfl:  .  tamsulosin (FLOMAX) 0.4 MG CAPS capsule, Take 0.4 mg by mouth at bedtime. , Disp: , Rfl: 3  Past Medical History: Past Medical History:  Diagnosis Date  . Atrial flutter ([place]) 09/16/2017   S/p RFCA // EF was 30% in setting of AFl w/ RVR // EF improved in NSR ([place] 8/19: Mod conc LVH, EF 50-55, Gr 1 DD, mild dilated ascending aorta (40 mm), MAC, trivial TR)  . CAD (coronary artery disease) 04/06/2018   LHC 10/19: dLM 40-50, LAD 100 CTO, D1 99, LCx 99, RI 60; R-L collats, EF normal // s/p CABG 11/19 (L-LAD, RIMA-Dx)  . Cancer ([place])   . Diabetes mellitus without complication ([place])   . HTN (hypertension)   .  Hyperlipidemia LDL goal <70   . OA (osteoarthritis)    right knee  . PAC (premature atrial contraction)   . Pneumonia    walking pneumonia--yrs. ago  . Prostate cancer ([place])   . Sleep apnea     Tobacco Use: Social History   Tobacco Use  Smoking Status Never Smoker  Smokeless Tobacco Never Used    Labs: Recent Review Flowsheet Data    Labs for ITP Cardiac and Pulmonary Rehab Latest Ref Rng & Units 03/20/2018 03/20/2018 03/20/2018 03/20/2018 03/21/2018   Cholestrol 125 - 200 mg/dL - - - - -   LDLCALC <130 mg/dL - - - - -   HDL >=40 mg/dL - - - - -   Trlycerides <150 mg/dL - - - - -   Hemoglobin A1c 4.8 - 5.6 % - - - - -   PHART 7.350 - 7.450 7.352 7.372 7.379 - -   PCO2ART 32.0 - 48.0 mmHg 44.4 37.7 39.5  - -   HCO3 20.0 - 28.0 mmol/L 25.0 21.9 23.2 - -   TCO2 22 - 32 mmol/L _0 ACIDBASEDEF 0.0 - 2.0 mmol/L 1.0 3.0(H) 2.0 - -   O2SAT % 94.0 99.0 97.0 - -      Capillary Blood Glucose: Lab Results  Component Value Date   GLUCAP 124 (H) 03/24/2018   GLUCAP 115 (H) 03/23/2018   GLUCAP 109 (H) 03/23/2018   GLUCAP 97 03/23/2018   GLUCAP 127 (H) 03/23/2018     Exercise Target Goals: Exercise Program Goal: Individual exercise prescription set using results from initial 6 min walk test and THRR while considering  patient's activity barriers and safety.   Exercise Prescription Goal: Initial exercise prescription builds to 30-45 minutes a day of aerobic activity, 2-3 days per week.  Home exercise guidelines will be given to patient during program as part of exercise prescription that the participant will acknowledge.  Activity Barriers & Risk Stratification: Activity Barriers & Cardiac Risk Stratification - 05/11/18 1034      Activity Barriers & Cardiac Risk Stratification   Activity Barriers  Arthritis    Cardiac Risk Stratification  High       6 Minute Walk: 6 Minute Walk    Row Name 05/11/18 1031         6 Minute Walk   Phase  Initial     Distance  1617 feet     Walk Time  6 minutes     # of Rest Breaks  0     MPH  3.06     METS  2.98     RPE  11     Perceived Dyspnea   0     VO2 Peak  10.42     Symptoms  No     Resting HR  62 bpm     Resting BP  124/74     Resting Oxygen Saturation   98 %     Exercise Oxygen Saturation  during 6 min walk  98 %     Max Ex. HR  115 bpm     Max Ex. BP  128/80     2 Minute Post BP  124/84        Oxygen Initial Assessment:   Oxygen Re-Evaluation:   Oxygen Discharge (Final Oxygen Re-Evaluation):   Initial Exercise Prescription: Initial Exercise Prescription - 05/11/18 1000      Date of Initial Exercise RX and Referring Provider   Date  05/11/18  Referring Provider  Sherren Mocha MD     Expected Discharge  Date  08/18/18      Treadmill   MPH  2.2    Grade  1    Minutes  10    METs  2.99      Recumbant Bike   Level  2    Watts  30    Minutes  10    METs  2.9      NuStep   Level  2    SPM  75    Minutes  10    METs  2.5      Prescription Details   Frequency (times per week)  3x    Duration  Progress to 30 minutes of continuous aerobic without signs/symptoms of physical distress      Intensity   THRR 40-80% of Max Heartrate  62-124    Ratings of Perceived Exertion  11-13    Perceived Dyspnea  0-4      Progression   Progression  Continue progressive overload as per policy without signs/symptoms or physical distress.      Resistance Training   Training Prescription  Yes    Weight  5lbs    Reps  10-15       Perform Capillary Blood Glucose checks as needed.  Exercise Prescription Changes:   Exercise Comments:   Exercise Goals and Review: Exercise Goals    Row Name 05/11/18 1035             Exercise Goals   Increase Physical Activity  Yes       Intervention  Provide advice, education, support and counseling about physical activity/exercise needs.;Develop an individualized exercise prescription for aerobic and resistive training based on initial evaluation findings, risk stratification, comorbidities and participant's personal goals.       Expected Outcomes  Short Term: Attend rehab on a regular basis to increase amount of physical activity.;Long Term: Add in home exercise to make exercise part of routine and to increase amount of physical activity.;Long Term: Exercising regularly at least 3-5 days a week.       Increase Strength and Stamina  Yes       Intervention  Provide advice, education, support and counseling about physical activity/exercise needs.;Develop an individualized exercise prescription for aerobic and resistive training based on initial evaluation findings, risk stratification, comorbidities and participant's personal goals.       Expected Outcomes   Short Term: Increase workloads from initial exercise prescription for resistance, speed, and METs.;Short Term: Perform resistance training exercises routinely during rehab and add in resistance training at home;Long Term: Improve cardiorespiratory fitness, muscular endurance and strength as measured by increased METs and functional capacity (6MWT)       Able to understand and use rate of perceived exertion (RPE) scale  Yes       Intervention  Provide education and explanation on how to use RPE scale       Expected Outcomes  Short Term: Able to use RPE daily in rehab to express subjective intensity level;Long Term:  Able to use RPE to guide intensity level when exercising independently       Knowledge and understanding of Target Heart Rate Range (THRR)  Yes       Intervention  Provide education and explanation of THRR including how the numbers were predicted and where they are located for reference       Expected Outcomes  Short Term: Able to state/look up THRR;Short Term: Able  to use daily as guideline for intensity in rehab;Long Term: Able to use THRR to govern intensity when exercising independently       Able to check pulse independently  Yes       Intervention  Provide education and demonstration on how to check pulse in carotid and radial arteries.;Review the importance of being able to check your own pulse for safety during independent exercise       Expected Outcomes  Short Term: Able to explain why pulse checking is important during independent exercise;Long Term: Able to check pulse independently and accurately       Understanding of Exercise Prescription  Yes       Intervention  Provide education, explanation, and written materials on patient's individual exercise prescription       Expected Outcomes  Short Term: Able to explain program exercise prescription;Long Term: Able to explain home exercise prescription to exercise independently          Exercise Goals Re-Evaluation :   Discharge  Exercise Prescription (Final Exercise Prescription Changes):   Nutrition:  Target Goals: Understanding of nutrition guidelines, daily intake of sodium <1524m, cholesterol <2080m calories 30% from fat and 7% or less from saturated fats, daily to have 5 or more servings of fruits and vegetables.  Biometrics: Pre Biometrics - 05/11/18 1034      Pre Biometrics   Height  6' 2.25" (1.886 m)    Weight  (!) 146.8 kg    Waist Circumference  50.5 inches    Hip Circumference  52.5 inches    Waist to Hip Ratio  0.96 %    BMI (Calculated)  41.27    Triceps Skinfold  32 mm    % Body Fat  39.7 %    Grip Strength  36.5 kg    Flexibility  0 in    Single Leg Stand  5 seconds        Nutrition Therapy Plan and Nutrition Goals:   Nutrition Assessments:   Nutrition Goals Re-Evaluation:   Nutrition Goals Re-Evaluation:   Nutrition Goals Discharge (Final Nutrition Goals Re-Evaluation):   Psychosocial: Target Goals: Acknowledge presence or absence of significant depression and/or stress, maximize coping skills, provide positive support system. Participant is able to verbalize types and ability to use techniques and skills needed for reducing stress and depression.  Initial Review & Psychosocial Screening: Initial Psych Review & Screening - 05/11/18 1030      Initial Review   Current issues with  None Identified      Family Dynamics   Good Support System?  Yes   "Ike" lists his spouse, family, and coworkers as sources of support.      Barriers   Psychosocial barriers to participate in program  There are no identifiable barriers or psychosocial needs.      Screening Interventions   Interventions  Encouraged to exercise       Quality of Life Scores: Quality of Life - 05/11/18 1030      Quality of Life   Select  Quality of Life      Quality of Life Scores   Health/Function Pre  25.2 %    Socioeconomic Pre  27.86 %    Psych/Spiritual Pre  28.93 %    Family Pre  28.3 %     GLOBAL Pre  26.97 %      Scores of 19 and below usually indicate a poorer quality of life in these areas.  A difference of  2-3 points  is a clinically meaningful difference.  A difference of 2-3 points in the total score of the Quality of Life Index has been associated with significant improvement in overall quality of life, self-image, physical symptoms, and general health in studies assessing change in quality of life.  PHQ-9: Recent Review Flowsheet Data    Depression screen [facility] 2/9 02/14/2015 11/18/2014   Decreased Interest 0 0   Down, Depressed, Hopeless 0 0   PHQ - 2 Score 0 0     Interpretation of Total Score  Total Score Depression Severity:  1-4 = Minimal depression, 5-9 = Mild depression, 10-14 = Moderate depression, 15-19 = Moderately severe depression, 20-27 = Severe depression   Psychosocial Evaluation and Intervention:   Psychosocial Re-Evaluation:   Psychosocial Discharge (Final Psychosocial Re-Evaluation):   Vocational Rehabilitation: Provide vocational rehab assistance to qualifying candidates.   Vocational Rehab Evaluation & Intervention: Vocational Rehab - 05/11/18 1030      Initial Vocational Rehab Evaluation & Intervention   Assessment shows need for Vocational Rehabilitation  No       Education: Education Goals: Education classes will be provided on a weekly basis, covering required topics. Participant will state understanding/return demonstration of topics presented.  Learning Barriers/Preferences: Learning Barriers/Preferences - 05/11/18 1050      Learning Barriers/Preferences   Learning Barriers  Hearing    Learning Preferences  Verbal Instruction;Skilled Demonstration;Written Material;Individual Instruction       Education Topics: Count Your Pulse:  -Group instruction provided by verbal instruction, demonstration, patient participation and written materials to support subject.  Instructors address importance of being able to find your pulse  and how to count your pulse when at home without a heart monitor.  Patients get hands on experience counting their pulse with staff help and individually.   Heart Attack, Angina, and Risk Factor Modification:  -Group instruction provided by verbal instruction, video, and written materials to support subject.  Instructors address signs and symptoms of angina and heart attacks.    Also discuss risk factors for heart disease and how to make changes to improve heart health risk factors.   Functional Fitness:  -Group instruction provided by verbal instruction, demonstration, patient participation, and written materials to support subject.  Instructors address safety measures for doing things around the house.  Discuss how to get up and down off the floor, how to pick things up properly, how to safely get out of a chair without assistance, and balance training.   Meditation and Mindfulness:  -Group instruction provided by verbal instruction, patient participation, and written materials to support subject.  Instructor addresses importance of mindfulness and meditation practice to help reduce stress and improve awareness.  Instructor also leads participants through a meditation exercise.    Stretching for Flexibility and Mobility:  -Group instruction provided by verbal instruction, patient participation, and written materials to support subject.  Instructors lead participants through series of stretches that are designed to increase flexibility thus improving mobility.  These stretches are additional exercise for major muscle groups that are typically performed during regular warm up and cool down.   Hands Only CPR:  -Group verbal, video, and participation provides a basic overview of AHA guidelines for community CPR. Role-play of emergencies allow participants the opportunity to practice calling for help and chest compression technique with discussion of AED use.   Hypertension: -Group verbal and  written instruction that provides a basic overview of hypertension including the most recent diagnostic guidelines, risk factor reduction with self-care instructions and medication management.  Nutrition I class: Heart Healthy Eating:  -Group instruction provided by PowerPoint slides, verbal discussion, and written materials to support subject matter. The instructor gives an explanation and review of the Therapeutic Lifestyle Changes diet recommendations, which includes a discussion on lipid goals, dietary fat, sodium, fiber, plant stanol/sterol esters, sugar, and the components of a well-balanced, healthy diet.   Nutrition II class: Lifestyle Skills:  -Group instruction provided by PowerPoint slides, verbal discussion, and written materials to support subject matter. The instructor gives an explanation and review of label reading, grocery shopping for heart health, heart healthy recipe modifications, and ways to make healthier choices when eating out.   Diabetes Question & Answer:  -Group instruction provided by PowerPoint slides, verbal discussion, and written materials to support subject matter. The instructor gives an explanation and review of diabetes co-morbidities, pre- and post-prandial blood glucose goals, pre-exercise blood glucose goals, signs, symptoms, and treatment of hypoglycemia and hyperglycemia, and foot care basics.   Diabetes Blitz:  -Group instruction provided by PowerPoint slides, verbal discussion, and written materials to support subject matter. The instructor gives an explanation and review of the physiology behind type 1 and type 2 diabetes, diabetes medications and rational behind using different medications, pre- and post-prandial blood glucose recommendations and Hemoglobin A1c goals, diabetes diet, and exercise including blood glucose guidelines for exercising safely.    Portion Distortion:  -Group instruction provided by PowerPoint slides, verbal discussion,  written materials, and food models to support subject matter. The instructor gives an explanation of serving size versus portion size, changes in portions sizes over the last 20 years, and what consists of a serving from each food group.   Stress Management:  -Group instruction provided by verbal instruction, video, and written materials to support subject matter.  Instructors review role of stress in heart disease and how to cope with stress positively.     Exercising on Your Own:  -Group instruction provided by verbal instruction, power point, and written materials to support subject.  Instructors discuss benefits of exercise, components of exercise, frequency and intensity of exercise, and end points for exercise.  Also discuss use of nitroglycerin and activating EMS.  Review options of places to exercise outside of rehab.  Review guidelines for sex with heart disease.   Cardiac Drugs I:  -Group instruction provided by verbal instruction and written materials to support subject.  Instructor reviews cardiac drug classes: antiplatelets, anticoagulants, beta blockers, and statins.  Instructor discusses reasons, side effects, and lifestyle considerations for each drug class.   Cardiac Drugs II:  -Group instruction provided by verbal instruction and written materials to support subject.  Instructor reviews cardiac drug classes: angiotensin converting enzyme inhibitors (ACE-I), angiotensin II receptor blockers (ARBs), nitrates, and calcium channel blockers.  Instructor discusses reasons, side effects, and lifestyle considerations for each drug class.   Anatomy and Physiology of the Circulatory System:  Group verbal and written instruction and models provide basic cardiac anatomy and physiology, with the coronary electrical and arterial systems. Review of: AMI, Angina, Valve disease, Heart Failure, Peripheral Artery Disease, Cardiac Arrhythmia, Pacemakers, and the ICD.   Other Education:  -Group  or individual verbal, written, or video instructions that support the educational goals of the cardiac rehab program.   Holiday Eating Survival Tips:  -Group instruction provided by PowerPoint slides, verbal discussion, and written materials to support subject matter. The instructor gives patients tips, tricks, and techniques to help them not only survive but enjoy the holidays despite the onslaught of food that  accompanies the holidays.   Knowledge Questionnaire Score: Knowledge Questionnaire Score - 05/11/18 1028      Knowledge Questionnaire Score   Pre Score  19/24       Core Components/Risk Factors/Patient Goals at Admission: Personal Goals and Risk Factors at Admission - 05/11/18 1051      Core Components/Risk Factors/Patient Goals on Admission    Weight Management  Yes    Intervention  Weight Management: Develop a combined nutrition and exercise program designed to reach desired caloric intake, while maintaining appropriate intake of nutrient and fiber, sodium and fats, and appropriate energy expenditure required for the weight goal.;Weight Management: Provide education and appropriate resources to help participant work on and attain dietary goals.;Weight Management/Obesity: Establish reasonable short term and long term weight goals.;Obesity: Provide education and appropriate resources to help participant work on and attain dietary goals.    Admit Weight  323 lb 10.2 oz (146.8 kg)    Goal Weight: Short Term  315 lb (142.9 kg)    Goal Weight: Long Term  300 lb (136.1 kg)    Expected Outcomes  Short Term: Continue to assess and modify interventions until short term weight is achieved;Long Term: Adherence to nutrition and physical activity/exercise program aimed toward attainment of established weight goal;Weight Loss: Understanding of general recommendations for a balanced deficit meal plan, which promotes 1-2 lb weight loss per week and includes a negative energy balance of [phone]  kcal/d;Understanding of distribution of calorie intake throughout the day with the consumption of 4-5 meals/snacks;Understanding recommendations for meals to include 15-35% energy as protein, 25-35% energy from fat, 35-60% energy from carbohydrates, less than 219m of dietary cholesterol, 20-35 gm of total fiber daily    Diabetes  Yes    Intervention  Provide education about signs/symptoms and action to take for hypo/hyperglycemia.;Provide education about proper nutrition, including hydration, and aerobic/resistive exercise prescription along with prescribed medications to achieve blood glucose in normal ranges: Fasting glucose 65-99 mg/dL    Expected Outcomes  Short Term: Participant verbalizes understanding of the signs/symptoms and immediate care of hyper/hypoglycemia, proper foot care and importance of medication, aerobic/resistive exercise and nutrition plan for blood glucose control.;Long Term: Attainment of HbA1C < 7%.    Hypertension  Yes    Intervention  Provide education on lifestyle modifcations including regular physical activity/exercise, weight management, moderate sodium restriction and increased consumption of fresh fruit, vegetables, and low fat dairy, alcohol moderation, and smoking cessation.;Monitor prescription use compliance.    Expected Outcomes  Short Term: Continued assessment and intervention until BP is < 140/92mHG in hypertensive participants. < 130/8064mG in hypertensive participants with diabetes, heart failure or chronic kidney disease.;Long Term: Maintenance of blood pressure at goal levels.    Lipids  Yes    Intervention  Provide education and support for participant on nutrition & aerobic/resistive exercise along with prescribed medications to achieve LDL <22m54mDL >40mg46m Expected Outcomes  Short Term: Participant states understanding of desired cholesterol values and is compliant with medications prescribed. Participant is following exercise prescription and  nutrition guidelines.;Long Term: Cholesterol controlled with medications as prescribed, with individualized exercise RX and with personalized nutrition plan. Value goals: LDL < 22mg,74m > 40 mg.       Core Components/Risk Factors/Patient Goals Review:    Core Components/Risk Factors/Patient Goals at Discharge (Final Review):    ITP Comments: ITP Comments    Row Name 05/11/18 1030           ITP Comments  Dr. Fransico Him, Medical Director           Comments: Patient attended orientation from [phone] to 1000 to review rules and guidelines for program. Completed 6 minute walk test, Intitial ITP, and exercise prescription.  VSS. Telemetry-Sinus Arrhythmia.  Asymptomatic.

## 2018-05-11 NOTE — Progress Notes (Addendum)
Garrett Welch 68 y.o. male DOB: 08-Jul-1950 MRN: [id number]      Nutrition Note  1. S/P CABG x 2    Past Medical History:  Diagnosis Date  . Atrial flutter ([place]) 09/16/2017   S/p RFCA // EF was 30% in setting of AFl w/ RVR // EF improved in NSR ([place] 8/19: Mod conc LVH, EF 50-55, Gr 1 DD, mild dilated ascending aorta (40 mm), MAC, trivial TR)  . CAD (coronary artery disease) 04/06/2018   LHC 10/19: dLM 40-50, LAD 100 CTO, D1 99, LCx 99, RI 60; R-L collats, EF normal // s/p CABG 11/19 (L-LAD, RIMA-Dx)  . Cancer ([place])   . Diabetes mellitus without complication ([place])   . HTN (hypertension)   . Hyperlipidemia LDL goal <70   . OA (osteoarthritis)    right knee  . PAC (premature atrial contraction)   . Pneumonia    walking pneumonia--yrs. ago  . Prostate cancer ([place])   . Sleep apnea    Meds reviewed.   Current Outpatient Medications (Endocrine & Metabolic):  .  metFORMIN (GLUCOPHAGE) 500 MG tablet, Take 1,000 mg by mouth 2 (two) times daily.   Current Outpatient Medications (Cardiovascular):  .  irbesartan (AVAPRO) 75 MG tablet, Take 1 tablet (75 mg total) by mouth daily. .  metoprolol tartrate (LOPRESSOR) 50 MG tablet, Take 0.5 tablets (25 mg total) by mouth 2 (two) times daily. .  rosuvastatin (CRESTOR) 20 MG tablet, Take 1 tablet (20 mg total) by mouth daily. (Patient taking differently: Take 40 mg by mouth daily. )  Current Outpatient Medications (Respiratory):  .  albuterol (PROVENTIL HFA;VENTOLIN HFA) 108 (90 Base) MCG/ACT inhaler, Inhale 2 puffs into the lungs every 6 (six) hours as needed for wheezing or shortness of breath. .  fluticasone (FLONASE) 50 MCG/ACT nasal spray, Place 2 sprays into both nostrils daily as needed for allergies.   Current Outpatient Medications (Analgesics):  .  acetaminophen (TYLENOL) 500 MG tablet, Take 1,000 mg by mouth every 8 (eight) hours as needed (knee pain).  Marland Kitchen  aspirin EC 325 MG EC tablet, Take 1 tablet (325 mg total) by mouth  daily. (Patient not taking: Reported on 05/03/2018) .  aspirin EC 81 MG tablet, Take 81 mg by mouth daily.  Current Outpatient Medications (Hematological):  .  ferrous sulfate 325 (65 FE) MG EC tablet, Take 1 tablet (325 mg total) by mouth daily with breakfast.  Current Outpatient Medications (Other):  Marland Kitchen  COLLAGEN PO, Take 2 capsules by mouth daily. .  Multiple Vitamins-Minerals (MULTIVITAMIN WITH MINERALS) tablet, Take 1 tablet by mouth daily. .  Omega-3 Fatty Acids (FISH OIL) 1000 MG CAPS, Take 1,000 mg by mouth daily. Vladimir Faster Glycol-Propyl Glycol (LUBRICANT EYE DROPS) 0.4-0.3 % SOLN, Place 1 drop into both eyes 3 (three) times daily as needed (for dry/irritated eyes.). Marland Kitchen  pramipexole (MIRAPEX) 0.125 MG tablet, Take 0.125 mg by mouth at bedtime. .  sodium fluoride (PREVIDENT 5000 PLUS) 1.1 % CREA dental cream, Take 1 application by mouth 2 (two) times daily.  .  tamsulosin (FLOMAX) 0.4 MG CAPS capsule, Take 0.4 mg by mouth at bedtime.    HT: Ht Readings from Last 1 Encounters:  05/11/18 6' 2.25" (1.886 m)    WT: Wt Readings from Last 5 Encounters:  05/11/18 (!) 323 lb 10.2 oz (146.8 kg)  04/26/18 (!) 324 lb (147 kg)  04/24/18 (!) 324 lb (147 kg)  04/07/18 (!) 328 lb (148.8 kg)  03/24/18 (!) 330 lb 11 oz (  150 kg)     Body mass index is 41.27 kg/m.   Current tobacco use? No  Labs:  Lipid Panel     Component Value Date/Time   CHOL 141 06/05/2015 0855   TRIG 65 06/05/2015 0855   HDL 47 06/05/2015 0855   CHOLHDL 3.0 06/05/2015 0855   VLDL 13 06/05/2015 0855   LDLCALC 81 06/05/2015 0855    Lab Results  Component Value Date   HGBA1C 6.4 (H) 03/16/2018   CBG (last 3)  No results for input(s): GLUCAP in the last 72 hours.  Nutrition Note Spoke with pt. Nutrition plan and goals reviewed with pt. Pt is following Step 1 of the Therapeutic Lifestyle Changes diet. Pt wants to lose wt. Pt has been trying to lose wt by following keto diet, pt shared that he has cheat days on  this though. Discussed with patient that a life long dietary change is sustainable over time and does not need cheat days. Discussed following more of a mediterranean style meal pattern, focusing on lean protein and protein from plant sources, moderating carbohydrates but making them complex, and including heart healthy fats. Wt loss tips reviewed (label reading, how to build a healthy plate, portion sizes, eating frequently across the day).  Pt has Type 2 Diabetes. Last A1c indicates blood glucose well-controlled. This Probation officer went over Diabetes Education test results. Pt checks CBG's 1 times a day. Fasting CBG's reportedly 120's mg/dL. Per discussion, pt does not use canned/convenience foods often. Pt does not add salt to food. Pt does not eat out frequently. Pt expressed understanding of the information reviewed. Pt aware of nutrition education classes offered and would like to attend nutrition classes.  Nutrition Diagnosis ? Food-and nutrition-related knowledge deficit related to lack of exposure to information as related to diagnosis of: ? CVD ? Type 2 Diabetes ? Obese  Welch = 40+ related to excessive energy intake as evidenced by a Body mass index is 41.27 kg/m.  Nutrition Intervention ? Pt's individual nutrition plan and goals reviewed with pt. ? Pt given handouts for: ? Nutrition I class ? Nutrition II class   Nutrition Goal(s):  ? Pt to identify and limit food sources of saturated fat, trans fat, refined carbohydrates and sodium ? Pt to identify food quantities necessary to achieve weight loss of 6-24 lbs. at graduation from cardiac rehab. ? Pt able to name foods that affect blood glucose.  Plan:  ? Pt to attend nutrition classes ? Nutrition I ? Nutrition II ? Portion Distortion  ? Diabetes Blitz ? Diabetes Q & Ae determined ? Will provide client-centered nutrition education as part of interdisciplinary care ? Monitor and evaluate progress toward nutrition goal with team.   Laurina Bustle, MS, RD, LDN 05/11/2018 11:30 AM

## 2018-05-13 ENCOUNTER — Other Ambulatory Visit: Payer: Self-pay | Admitting: Cardiovascular Disease

## 2018-05-15 ENCOUNTER — Encounter (HOSPITAL_COMMUNITY): Payer: BLUE CROSS/BLUE SHIELD

## 2018-05-17 ENCOUNTER — Encounter (HOSPITAL_COMMUNITY): Payer: Self-pay

## 2018-05-17 ENCOUNTER — Encounter (HOSPITAL_COMMUNITY)
Admission: RE | Admit: 2018-05-17 | Discharge: 2018-05-17 | Disposition: A | Discharge status: Home / Self Care | Payer: BLUE CROSS/BLUE SHIELD | Source: Ambulatory Visit | Attending: Cardiovascular Disease | Admitting: Cardiovascular Disease

## 2018-05-17 DIAGNOSIS — Z951 Presence of aortocoronary bypass graft: Secondary | ICD-10-CM

## 2018-05-17 LAB — GLUCOSE, CAPILLARY: Glucose-Capillary: 116 mg/dL — ABNORMAL HIGH (ref 70–99)

## 2018-05-17 NOTE — Progress Notes (Signed)
Daily Session Note  Patient Details  Name: Garrett Welch MRN: [id number] Date of Birth: [birth year] Referring Provider:   Flowsheet Row CARDIAC REHAB PHASE II ORIENTATION from 05/11/2018 in [place]  Referring Provider  Sherren Mocha MD       Encounter Date: 05/17/2018  Check In: Session Check In - 05/17/18 0727    Check-In          Supervising physician immediately available to respond to emergencies  [facility] immediately available    Physician(s)  Dr. Ree Kida    Location  [facility]    Staff Present  Dorma Russell, MS,ACSM CEP, Exercise Physiologist;Bryan Goin, RN, BSN;Ramon Dredge, RN, MHA;Olinty Celesta Aver, MS, ACSM CEP, Exercise Physiologist    Medication changes reported      No    Fall or balance concerns reported     No    Tobacco Cessation  No Change    Warm-up and Cool-down  Performed as group-led instruction    Resistance Training Performed  No    VAD Patient?  No    PAD/SET Patient?  No        Pain Assessment          Currently in Pain?  No/denies    Multiple Pain Sites  No           Capillary Blood Glucose: Results for orders placed or performed during the hospital encounter of 05/17/18 (from the past 24 hour(s))  Glucose, capillary     Status: Abnormal   Collection Time: 05/17/18  7:52 AM  Result Value Ref Range   Glucose-Capillary 116 (H) 70 - 99 mg/dL    Exercise Prescription Changes - 05/17/18 0800    Response to Exercise          Blood Pressure (Admit)  130/72    Blood Pressure (Exercise)  150/72    Blood Pressure (Exit)  120/80    Heart Rate (Admit)  72 bpm    Heart Rate (Exercise)  95 bpm    Heart Rate (Exit)  59 bpm    Rating of Perceived Exertion (Exercise)  12    Perceived Dyspnea (Exercise)  0    Symptoms  None    Comments  Pt oriented to exercise equipment    Duration  Progress to 30 minutes of  aerobic without signs/symptoms of physical distress    Intensity  THRR unchanged        Progression          Progression  Continue to progress workloads to maintain intensity without signs/symptoms of physical distress.    Average METs  2.99        Resistance Training          Training Prescription  No        Treadmill          MPH  2.2    Grade  1    Minutes  10    METs  2.99        Recumbant Bike          Level  2    Watts  30    Minutes  10    METs  2.5        NuStep          Level  2    SPM  75    Minutes  10    METs  2.5  Social History   Tobacco Use  Smoking Status Never Smoker  Smokeless Tobacco Never Used    Goals Met:  Exercise tolerated well  Goals Unmet:  Not Applicable  Comments: Pt started cardiac rehab today.  Pt tolerated light exercise without difficulty. VSS, telemetry-sinus rhythm, asymptomatic.  Medication list reconciled. Pt denies barriers to medicaiton compliance.  PSYCHOSOCIAL ASSESSMENT:  PHQ-0.  Pt exhibits positive coping skills, hopeful outlook with supportive family. No psychosocial needs identified at this time, no psychosocial interventions necessary.  Pt oriented to exercise equipment and routine.    Understanding verbalized.  Andi Hence, RN, BSN Cardiac Pulmonary Rehab 05/17/18 4:56 PM    Dr. Fransico Him is Medical Director for Cardiac Rehab at [facility].

## 2018-05-19 ENCOUNTER — Encounter (HOSPITAL_COMMUNITY)
Admission: RE | Admit: 2018-05-19 | Discharge: 2018-05-19 | Disposition: A | Discharge status: Home / Self Care | Payer: BLUE CROSS/BLUE SHIELD | Source: Ambulatory Visit | Attending: Cardiovascular Disease | Admitting: Cardiovascular Disease

## 2018-05-19 DIAGNOSIS — Z951 Presence of aortocoronary bypass graft: Secondary | ICD-10-CM | POA: Diagnosis not present

## 2018-05-19 LAB — GLUCOSE, CAPILLARY
GLUCOSE-CAPILLARY: 128 mg/dL — AB (ref 70–99)
Glucose-Capillary: 143 mg/dL — ABNORMAL HIGH (ref 70–99)

## 2018-05-22 ENCOUNTER — Encounter (HOSPITAL_COMMUNITY)
Admission: RE | Admit: 2018-05-22 | Discharge: 2018-05-22 | Disposition: A | Discharge status: Home / Self Care | Payer: BLUE CROSS/BLUE SHIELD | Source: Ambulatory Visit | Attending: Cardiovascular Disease | Admitting: Cardiovascular Disease

## 2018-05-22 DIAGNOSIS — Z951 Presence of aortocoronary bypass graft: Secondary | ICD-10-CM

## 2018-05-24 ENCOUNTER — Encounter (HOSPITAL_COMMUNITY): Payer: BLUE CROSS/BLUE SHIELD

## 2018-05-26 ENCOUNTER — Encounter (HOSPITAL_COMMUNITY)
Admission: RE | Admit: 2018-05-26 | Discharge: 2018-05-26 | Disposition: A | Discharge status: Home / Self Care | Payer: BLUE CROSS/BLUE SHIELD | Source: Ambulatory Visit | Attending: Cardiovascular Disease | Admitting: Cardiovascular Disease

## 2018-05-26 DIAGNOSIS — Z951 Presence of aortocoronary bypass graft: Secondary | ICD-10-CM | POA: Diagnosis not present

## 2018-05-29 ENCOUNTER — Encounter (HOSPITAL_COMMUNITY): Payer: BLUE CROSS/BLUE SHIELD

## 2018-05-31 ENCOUNTER — Encounter (HOSPITAL_COMMUNITY)
Admission: RE | Admit: 2018-05-31 | Discharge: 2018-05-31 | Disposition: A | Discharge status: Home / Self Care | Payer: BLUE CROSS/BLUE SHIELD | Source: Ambulatory Visit | Attending: Cardiovascular Disease | Admitting: Cardiovascular Disease

## 2018-05-31 DIAGNOSIS — Z951 Presence of aortocoronary bypass graft: Secondary | ICD-10-CM | POA: Diagnosis present

## 2018-06-01 NOTE — Progress Notes (Signed)
Cardiac Individual Treatment Plan  Patient Details  Name: Garrett Welch MRN: [id number] Date of Birth: 10-Jul-1950 Referring Provider:     [place] from 05/11/2018 in [place]  Referring Provider  Sherren Mocha MD       Initial Encounter Date:    CARDIAC REHAB PHASE II ORIENTATION from 05/11/2018 in [place]  Date  05/11/18      Visit Diagnosis: S/P CABG x 2  Patient's Home Medications on Admission:  Current Outpatient Medications:  .  acetaminophen (TYLENOL) 500 MG tablet, Take 1,000 mg by mouth every 8 (eight) hours as needed (knee pain). , Disp: , Rfl:  .  albuterol (PROVENTIL HFA;VENTOLIN HFA) 108 (90 Base) MCG/ACT inhaler, Inhale 2 puffs into the lungs every 6 (six) hours as needed for wheezing or shortness of breath., Disp: , Rfl:  .  aspirin EC 325 MG EC tablet, Take 1 tablet (325 mg total) by mouth daily., Disp: 30 tablet, Rfl: 0 .  aspirin EC 81 MG tablet, Take 81 mg by mouth daily., Disp: , Rfl:  .  COLLAGEN PO, Take 2 capsules by mouth daily., Disp: , Rfl:  .  ferrous sulfate 325 (65 FE) MG EC tablet, Take 1 tablet (325 mg total) by mouth daily with breakfast., Disp: , Rfl: 3 .  fluticasone (FLONASE) 50 MCG/ACT nasal spray, Place 2 sprays into both nostrils daily as needed for allergies. , Disp: , Rfl:  .  irbesartan (AVAPRO) 75 MG tablet, Take 1 tablet (75 mg total) by mouth daily., Disp: 90 tablet, Rfl: 3 .  metFORMIN (GLUCOPHAGE) 500 MG tablet, Take 1,000 mg by mouth 2 (two) times daily. , Disp: , Rfl: 9 .  metoprolol tartrate (LOPRESSOR) 50 MG tablet, Take 0.5 tablets (25 mg total) by mouth 2 (two) times daily., Disp: 60 tablet, Rfl: 6 .  Multiple Vitamins-Minerals (MULTIVITAMIN WITH MINERALS) tablet, Take 1 tablet by mouth daily., Disp: , Rfl:  .  Omega-3 Fatty Acids (FISH OIL) 1000 MG CAPS, Take 1,000 mg by mouth daily., Disp: , Rfl:  .  Polyethyl Glycol-Propyl  Glycol (LUBRICANT EYE DROPS) 0.4-0.3 % SOLN, Place 1 drop into both eyes 3 (three) times daily as needed (for dry/irritated eyes.)., Disp: , Rfl:  .  pramipexole (MIRAPEX) 0.125 MG tablet, Take 0.125 mg by mouth at bedtime., Disp: , Rfl: 2 .  rosuvastatin (CRESTOR) 20 MG tablet, Take 1 tablet (20 mg total) by mouth daily. (Patient taking differently: Take 40 mg by mouth daily. ), Disp: 90 tablet, Rfl: 3 .  sodium fluoride (PREVIDENT 5000 PLUS) 1.1 % CREA dental cream, Take 1 application by mouth 2 (two) times daily. , Disp: , Rfl:  .  tamsulosin (FLOMAX) 0.4 MG CAPS capsule, Take 0.4 mg by mouth at bedtime. , Disp: , Rfl: 3  Past Medical History: Past Medical History:  Diagnosis Date  . Atrial flutter ([place]) 09/16/2017   S/p RFCA // EF was 30% in setting of AFl w/ RVR // EF improved in NSR ([place] 8/19: Mod conc LVH, EF 50-55, Gr 1 DD, mild dilated ascending aorta (40 mm), MAC, trivial TR)  . CAD (coronary artery disease) 04/06/2018   LHC 10/19: dLM 40-50, LAD 100 CTO, D1 99, LCx 99, RI 60; R-L collats, EF normal // s/p CABG 11/19 (L-LAD, RIMA-Dx)  . Cancer ([place])   . Diabetes mellitus without complication ([place])   . HTN (hypertension)   . Hyperlipidemia LDL goal <70   .  OA (osteoarthritis)    right knee  . PAC (premature atrial contraction)   . Pneumonia    walking pneumonia--yrs. ago  . Prostate cancer ([place])   . Sleep apnea     Tobacco Use: Social History   Tobacco Use  Smoking Status Never Smoker  Smokeless Tobacco Never Used    Labs: Recent Review Flowsheet Data    Labs for ITP Cardiac and Pulmonary Rehab Latest Ref Rng & Units 03/20/2018 03/20/2018 03/20/2018 03/20/2018 03/21/2018   Cholestrol 125 - 200 mg/dL - - - - -   LDLCALC <130 mg/dL - - - - -   HDL >=40 mg/dL - - - - -   Trlycerides <150 mg/dL - - - - -   Hemoglobin A1c 4.8 - 5.6 % - - - - -   PHART 7.350 - 7.450 7.352 7.372 7.379 - -   PCO2ART 32.0 - 48.0 mmHg 44.4 37.7 39.5 - -   HCO3 20.0 - 28.0 mmol/L 25.0 21.9  23.2 - -   TCO2 22 - 32 mmol/L _0 ACIDBASEDEF 0.0 - 2.0 mmol/L 1.0 3.0(H) 2.0 - -   O2SAT % 94.0 99.0 97.0 - -      Capillary Blood Glucose: Lab Results  Component Value Date   GLUCAP 128 (H) 05/19/2018   GLUCAP 143 (H) 05/19/2018   GLUCAP 116 (H) 05/17/2018   GLUCAP 124 (H) 03/24/2018   GLUCAP 115 (H) 03/23/2018     Exercise Target Goals: Exercise Program Goal: Individual exercise prescription set using results from initial 6 min walk test and THRR while considering  patient's activity barriers and safety.   Exercise Prescription Goal: Initial exercise prescription builds to 30-45 minutes a day of aerobic activity, 2-3 days per week.  Home exercise guidelines will be given to patient during program as part of exercise prescription that the participant will acknowledge.  Activity Barriers & Risk Stratification: Activity Barriers & Cardiac Risk Stratification - 05/11/18 1034      Activity Barriers & Cardiac Risk Stratification   Activity Barriers  Arthritis    Cardiac Risk Stratification  High       6 Minute Walk: 6 Minute Walk    Row Name 05/11/18 1031         6 Minute Walk   Phase  Initial     Distance  1617 feet     Walk Time  6 minutes     # of Rest Breaks  0     MPH  3.06     METS  2.98     RPE  11     Perceived Dyspnea   0     VO2 Peak  10.42     Symptoms  No     Resting HR  62 bpm     Resting BP  124/74     Resting Oxygen Saturation   98 %     Exercise Oxygen Saturation  during 6 min walk  98 %     Max Ex. HR  115 bpm     Max Ex. BP  128/80     2 Minute Post BP  124/84        Oxygen Initial Assessment:   Oxygen Re-Evaluation:   Oxygen Discharge (Final Oxygen Re-Evaluation):   Initial Exercise Prescription: Initial Exercise Prescription - 05/11/18 1000      Date of Initial Exercise RX and Referring Provider   Date  05/11/18    Referring Provider  Sherren Mocha MD     Expected Discharge Date  08/18/18      Treadmill    MPH  2.2    Grade  1    Minutes  10    METs  2.99      Recumbant Bike   Level  2    Watts  30    Minutes  10    METs  2.9      NuStep   Level  2    SPM  75    Minutes  10    METs  2.5      Prescription Details   Frequency (times per week)  3x    Duration  Progress to 30 minutes of continuous aerobic without signs/symptoms of physical distress      Intensity   THRR 40-80% of Max Heartrate  62-124    Ratings of Perceived Exertion  11-13    Perceived Dyspnea  0-4      Progression   Progression  Continue progressive overload as per policy without signs/symptoms or physical distress.      Resistance Training   Training Prescription  Yes    Weight  5lbs    Reps  10-15       Perform Capillary Blood Glucose checks as needed.  Exercise Prescription Changes: Exercise Prescription Changes    Row Name 05/17/18 0800 05/26/18 1000           Response to Exercise   Blood Pressure (Admit)  130/72  144/84      Blood Pressure (Exercise)  150/72  132/74      Blood Pressure (Exit)  120/80  124/70      Heart Rate (Admit)  72 bpm  70 bpm      Heart Rate (Exercise)  95 bpm  112 bpm      Heart Rate (Exit)  59 bpm  73 bpm      Rating of Perceived Exertion (Exercise)  12  13      Perceived Dyspnea (Exercise)  0  0      Symptoms  None  None      Comments  Pt oriented to exercise equipment  Pt doing 2 15 minute station due to chronic knee pain      Duration  Progress to 30 minutes of  aerobic without signs/symptoms of physical distress  Continue with 30 min of aerobic exercise without signs/symptoms of physical distress.      Intensity  THRR unchanged  THRR unchanged        Progression   Progression  Continue to progress workloads to maintain intensity without signs/symptoms of physical distress.  Continue to progress workloads to maintain intensity without signs/symptoms of physical distress.      Average METs  2.99  2.94        Resistance Training   Training Prescription  No  Yes       Weight  -  5lbs      Reps  -  10-15      Time  -  10 Minutes        Treadmill   MPH  2.2  2.4      Grade  1  1      Minutes  10  15      METs  2.99  3.17        Recumbant Bike   Level  2  -      Watts  30  -  Minutes  10  -      METs  2.5  -        NuStep   Level  2  4      SPM  75  85      Minutes  10  15      METs  2.5  2.7         Exercise Comments: Exercise Comments    Row Name 05/17/18 0803           Exercise Comments  Pt's first day of Cardiac Rehab. Pt oriented to exercise equipment. Pt responded well to workloads. Will continue to monitor and progress pt as tolerated.           Exercise Goals and Review: Exercise Goals    Row Name 05/11/18 1035             Exercise Goals   Increase Physical Activity  Yes       Intervention  Provide advice, education, support and counseling about physical activity/exercise needs.;Develop an individualized exercise prescription for aerobic and resistive training based on initial evaluation findings, risk stratification, comorbidities and participant's personal goals.       Expected Outcomes  Short Term: Attend rehab on a regular basis to increase amount of physical activity.;Long Term: Add in home exercise to make exercise part of routine and to increase amount of physical activity.;Long Term: Exercising regularly at least 3-5 days a week.       Increase Strength and Stamina  Yes       Intervention  Provide advice, education, support and counseling about physical activity/exercise needs.;Develop an individualized exercise prescription for aerobic and resistive training based on initial evaluation findings, risk stratification, comorbidities and participant's personal goals.       Expected Outcomes  Short Term: Increase workloads from initial exercise prescription for resistance, speed, and METs.;Short Term: Perform resistance training exercises routinely during rehab and add in resistance training at home;Long Term:  Improve cardiorespiratory fitness, muscular endurance and strength as measured by increased METs and functional capacity (6MWT)       Able to understand and use rate of perceived exertion (RPE) scale  Yes       Intervention  Provide education and explanation on how to use RPE scale       Expected Outcomes  Short Term: Able to use RPE daily in rehab to express subjective intensity level;Long Term:  Able to use RPE to guide intensity level when exercising independently       Knowledge and understanding of Target Heart Rate Range (THRR)  Yes       Intervention  Provide education and explanation of THRR including how the numbers were predicted and where they are located for reference       Expected Outcomes  Short Term: Able to state/look up THRR;Short Term: Able to use daily as guideline for intensity in rehab;Long Term: Able to use THRR to govern intensity when exercising independently       Able to check pulse independently  Yes       Intervention  Provide education and demonstration on how to check pulse in carotid and radial arteries.;Review the importance of being able to check your own pulse for safety during independent exercise       Expected Outcomes  Short Term: Able to explain why pulse checking is important during independent exercise;Long Term: Able to check pulse independently and accurately       Understanding of Exercise  Prescription  Yes       Intervention  Provide education, explanation, and written materials on patient's individual exercise prescription       Expected Outcomes  Short Term: Able to explain program exercise prescription;Long Term: Able to explain home exercise prescription to exercise independently          Exercise Goals Re-Evaluation :   Discharge Exercise Prescription (Final Exercise Prescription Changes): Exercise Prescription Changes - 05/26/18 1000      Response to Exercise   Blood Pressure (Admit)  144/84    Blood Pressure (Exercise)  132/74    Blood  Pressure (Exit)  124/70    Heart Rate (Admit)  70 bpm    Heart Rate (Exercise)  112 bpm    Heart Rate (Exit)  73 bpm    Rating of Perceived Exertion (Exercise)  13    Perceived Dyspnea (Exercise)  0    Symptoms  None    Comments  Pt doing 2 15 minute station due to chronic knee pain    Duration  Continue with 30 min of aerobic exercise without signs/symptoms of physical distress.    Intensity  THRR unchanged      Progression   Progression  Continue to progress workloads to maintain intensity without signs/symptoms of physical distress.    Average METs  2.94      Resistance Training   Training Prescription  Yes    Weight  5lbs    Reps  10-15    Time  10 Minutes      Treadmill   MPH  2.4    Grade  1    Minutes  15    METs  3.17      NuStep   Level  4    SPM  85    Minutes  15    METs  2.7       Nutrition:  Target Goals: Understanding of nutrition guidelines, daily intake of sodium <1545m, cholesterol <2020m calories 30% from fat and 7% or less from saturated fats, daily to have 5 or more servings of fruits and vegetables.  Biometrics: Pre Biometrics - 05/11/18 1034      Pre Biometrics   Height  6' 2.25" (1.886 m)    Weight  (!) 146.8 kg    Waist Circumference  50.5 inches    Hip Circumference  52.5 inches    Waist to Hip Ratio  0.96 %    BMI (Calculated)  41.27    Triceps Skinfold  32 mm    % Body Fat  39.7 %    Grip Strength  36.5 kg    Flexibility  0 in    Single Leg Stand  5 seconds        Nutrition Therapy Plan and Nutrition Goals: Nutrition Therapy & Goals - 05/11/18 1135      Nutrition Therapy   Diet  heart healthy, carb modified      Personal Nutrition Goals   Nutrition Goal  Pt to identify and limit food sources of saturated fat, trans fat, refined carbohydrates and sodium    Personal Goal #2  Pt to identify food quantities necessary to achieve weight loss of 6-24 lbs. at graduation from cardiac rehab.    Personal Goal #3  Pt able to name  foods that affect blood glucose.      Intervention Plan   Intervention  Prescribe, educate and counsel regarding individualized specific dietary modifications aiming towards targeted core components such as weight,  hypertension, lipid management, diabetes, heart failure and other comorbidities.    Expected Outcomes  Short Term Goal: Understand basic principles of dietary content, such as calories, fat, sodium, cholesterol and nutrients.;Long Term Goal: Adherence to prescribed nutrition plan.       Nutrition Assessments: Nutrition Assessments - 05/11/18 1136      MEDFICTS Scores   Pre Score  41       Nutrition Goals Re-Evaluation: Nutrition Goals Re-Evaluation    Row Name 05/11/18 1135             Goals   Current Weight  323 lb 10.2 oz (146.8 kg)          Nutrition Goals Re-Evaluation: Nutrition Goals Re-Evaluation    [place] Name 05/11/18 1135             Goals   Current Weight  323 lb 10.2 oz (146.8 kg)          Nutrition Goals Discharge (Final Nutrition Goals Re-Evaluation): Nutrition Goals Re-Evaluation - 05/11/18 1135      Goals   Current Weight  323 lb 10.2 oz (146.8 kg)       Psychosocial: Target Goals: Acknowledge presence or absence of significant depression and/or stress, maximize coping skills, provide positive support system. Participant is able to verbalize types and ability to use techniques and skills needed for reducing stress and depression.  Initial Review & Psychosocial Screening: Initial Psych Review & Screening - 05/11/18 1030      Initial Review   Current issues with  None Identified      Family Dynamics   Good Support System?  Yes   "Garrett Welch" lists his spouse, family, and coworkers as sources of support.      Barriers   Psychosocial barriers to participate in program  There are no identifiable barriers or psychosocial needs.      Screening Interventions   Interventions  Encouraged to exercise       Quality of Life Scores: Quality  of Life - 05/11/18 1030      Quality of Life   Select  Quality of Life      Quality of Life Scores   Health/Function Pre  25.2 %    Socioeconomic Pre  27.86 %    Psych/Spiritual Pre  28.93 %    Family Pre  28.3 %    GLOBAL Pre  26.97 %      Scores of 19 and below usually indicate a poorer quality of life in these areas.  A difference of  2-3 points is a clinically meaningful difference.  A difference of 2-3 points in the total score of the Quality of Life Index has been associated with significant improvement in overall quality of life, self-image, physical symptoms, and general health in studies assessing change in quality of life.  PHQ-9: Recent Review Flowsheet Data    Depression screen [facility] 2/9 02/14/2015 11/18/2014   Decreased Interest 0 0   Down, Depressed, Hopeless 0 0   PHQ - 2 Score 0 0     Interpretation of Total Score  Total Score Depression Severity:  1-4 = Minimal depression, 5-9 = Mild depression, 10-14 = Moderate depression, 15-19 = Moderately severe depression, 20-27 = Severe depression   Psychosocial Evaluation and Intervention: Psychosocial Evaluation - 05/17/18 1647      Psychosocial Evaluation & Interventions   Interventions  Encouraged to exercise with the program and follow exercise prescription    Comments  no psychosocial needs identified, no interventions necessary.  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 pt enjoys playing golf.     Expected Outcomes  pt will exhibit positive outlook with good coping skills.     Continue Psychosocial Services   No Follow up required       Psychosocial Re-Evaluation: Psychosocial Re-Evaluation    [place] Name 05/31/18 1711             Psychosocial Re-Evaluation   Current issues with  None Identified       Comments  No psychosocial interventions necessary.       Expected Outcomes  Garrett Welch will continue to exhibit a positive outlook with good coping skills.        Interventions  Encouraged to attend Cardiac  Rehabilitation for the exercise       Continue Psychosocial Services   No Follow up required          Psychosocial Discharge (Final Psychosocial Re-Evaluation): Psychosocial Re-Evaluation - 05/31/18 1711      Psychosocial Re-Evaluation   Current issues with  None Identified    Comments  No psychosocial interventions necessary.    Expected Outcomes  Garrett Welch will continue to exhibit a positive outlook with good coping skills.     Interventions  Encouraged to attend Cardiac Rehabilitation for the exercise    Continue Psychosocial Services   No Follow up required       Vocational Rehabilitation: Provide vocational rehab assistance to qualifying candidates.   Vocational Rehab Evaluation & Intervention: Vocational Rehab - 05/11/18 1030      Initial Vocational Rehab Evaluation & Intervention   Assessment shows need for Vocational Rehabilitation  No       Education: Education Goals: Education classes will be provided on a weekly basis, covering required topics. Participant will state understanding/return demonstration of topics presented.  Learning Barriers/Preferences: Learning Barriers/Preferences - 05/11/18 1050      Learning Barriers/Preferences   Learning Barriers  Hearing    Learning Preferences  Verbal Instruction;Skilled Demonstration;Written Material;Individual Instruction       Education Topics: Count Your Pulse:  -Group instruction provided by verbal instruction, demonstration, patient participation and written materials to support subject.  Instructors address importance of being able to find your pulse and how to count your pulse when at home without a heart monitor.  Patients get hands on experience counting their pulse with staff help and individually.   Heart Attack, Angina, and Risk Factor Modification:  -Group instruction provided by verbal instruction, video, and written materials to support subject.  Instructors address signs and symptoms of angina and heart  attacks.    Also discuss risk factors for heart disease and how to make changes to improve heart health risk factors.   Functional Fitness:  -Group instruction provided by verbal instruction, demonstration, patient participation, and written materials to support subject.  Instructors address safety measures for doing things around the house.  Discuss how to get up and down off the floor, how to pick things up properly, how to safely get out of a chair without assistance, and balance training.   Meditation and Mindfulness:  -Group instruction provided by verbal instruction, patient participation, and written materials to support subject.  Instructor addresses importance of mindfulness and meditation practice to help reduce stress and improve awareness.  Instructor also leads participants through a meditation exercise.    Stretching for Flexibility and Mobility:  -Group instruction provided by verbal instruction, patient participation, and written materials to support subject.  Instructors lead participants through series of stretches  that are designed to increase flexibility thus improving mobility.  These stretches are additional exercise for major muscle groups that are typically performed during regular warm up and cool down.   Hands Only CPR:  -Group verbal, video, and participation provides a basic overview of AHA guidelines for community CPR. Role-play of emergencies allow participants the opportunity to practice calling for help and chest compression technique with discussion of AED use.   Hypertension: -Group verbal and written instruction that provides a basic overview of hypertension including the most recent diagnostic guidelines, risk factor reduction with self-care instructions and medication management.    Nutrition I class: Heart Healthy Eating:  -Group instruction provided by PowerPoint slides, verbal discussion, and written materials to support subject matter. The instructor  gives an explanation and review of the Therapeutic Lifestyle Changes diet recommendations, which includes a discussion on lipid goals, dietary fat, sodium, fiber, plant stanol/sterol esters, sugar, and the components of a well-balanced, healthy diet.   Nutrition II class: Lifestyle Skills:  -Group instruction provided by PowerPoint slides, verbal discussion, and written materials to support subject matter. The instructor gives an explanation and review of label reading, grocery shopping for heart health, heart healthy recipe modifications, and ways to make healthier choices when eating out.   Diabetes Question & Answer:  -Group instruction provided by PowerPoint slides, verbal discussion, and written materials to support subject matter. The instructor gives an explanation and review of diabetes co-morbidities, pre- and post-prandial blood glucose goals, pre-exercise blood glucose goals, signs, symptoms, and treatment of hypoglycemia and hyperglycemia, and foot care basics.   Diabetes Blitz:  -Group instruction provided by PowerPoint slides, verbal discussion, and written materials to support subject matter. The instructor gives an explanation and review of the physiology behind type 1 and type 2 diabetes, diabetes medications and rational behind using different medications, pre- and post-prandial blood glucose recommendations and Hemoglobin A1c goals, diabetes diet, and exercise including blood glucose guidelines for exercising safely.    Portion Distortion:  -Group instruction provided by PowerPoint slides, verbal discussion, written materials, and food models to support subject matter. The instructor gives an explanation of serving size versus portion size, changes in portions sizes over the last 20 years, and what consists of a serving from each food group.   Stress Management:  -Group instruction provided by verbal instruction, video, and written materials to support subject matter.   Instructors review role of stress in heart disease and how to cope with stress positively.     Exercising on Your Own:  -Group instruction provided by verbal instruction, power point, and written materials to support subject.  Instructors discuss benefits of exercise, components of exercise, frequency and intensity of exercise, and end points for exercise.  Also discuss use of nitroglycerin and activating EMS.  Review options of places to exercise outside of rehab.  Review guidelines for sex with heart disease.   Cardiac Drugs I:  -Group instruction provided by verbal instruction and written materials to support subject.  Instructor reviews cardiac drug classes: antiplatelets, anticoagulants, beta blockers, and statins.  Instructor discusses reasons, side effects, and lifestyle considerations for each drug class.   Cardiac Drugs II:  -Group instruction provided by verbal instruction and written materials to support subject.  Instructor reviews cardiac drug classes: angiotensin converting enzyme inhibitors (ACE-I), angiotensin II receptor blockers (ARBs), nitrates, and calcium channel blockers.  Instructor discusses reasons, side effects, and lifestyle considerations for each drug class.   Anatomy and Physiology of the Circulatory System:  Group  verbal and written instruction and models provide basic cardiac anatomy and physiology, with the coronary electrical and arterial systems. Review of: AMI, Angina, Valve disease, Heart Failure, Peripheral Artery Disease, Cardiac Arrhythmia, Pacemakers, and the ICD.   Other Education:  -Group or individual verbal, written, or video instructions that support the educational goals of the cardiac rehab program.   Holiday Eating Survival Tips:  -Group instruction provided by PowerPoint slides, verbal discussion, and written materials to support subject matter. The instructor gives patients tips, tricks, and techniques to help them not only survive but enjoy  the holidays despite the onslaught of food that accompanies the holidays.   Knowledge Questionnaire Score: Knowledge Questionnaire Score - 05/11/18 1028      Knowledge Questionnaire Score   Pre Score  19/24       Core Components/Risk Factors/Patient Goals at Admission: Personal Goals and Risk Factors at Admission - 05/11/18 1051      Core Components/Risk Factors/Patient Goals on Admission    Weight Management  Yes    Intervention  Weight Management: Develop a combined nutrition and exercise program designed to reach desired caloric intake, while maintaining appropriate intake of nutrient and fiber, sodium and fats, and appropriate energy expenditure required for the weight goal.;Weight Management: Provide education and appropriate resources to help participant work on and attain dietary goals.;Weight Management/Obesity: Establish reasonable short term and long term weight goals.;Obesity: Provide education and appropriate resources to help participant work on and attain dietary goals.    Admit Weight  323 lb 10.2 oz (146.8 kg)    Goal Weight: Short Term  315 lb (142.9 kg)    Goal Weight: Long Term  300 lb (136.1 kg)    Expected Outcomes  Short Term: Continue to assess and modify interventions until short term weight is achieved;Long Term: Adherence to nutrition and physical activity/exercise program aimed toward attainment of established weight goal;Weight Loss: Understanding of general recommendations for a balanced deficit meal plan, which promotes 1-2 lb weight loss per week and includes a negative energy balance of [phone] kcal/d;Understanding of distribution of calorie intake throughout the day with the consumption of 4-5 meals/snacks;Understanding recommendations for meals to include 15-35% energy as protein, 25-35% energy from fat, 35-60% energy from carbohydrates, less than 276m of dietary cholesterol, 20-35 gm of total fiber daily    Diabetes  Yes    Intervention  Provide education  about signs/symptoms and action to take for hypo/hyperglycemia.;Provide education about proper nutrition, including hydration, and aerobic/resistive exercise prescription along with prescribed medications to achieve blood glucose in normal ranges: Fasting glucose 65-99 mg/dL    Expected Outcomes  Short Term: Participant verbalizes understanding of the signs/symptoms and immediate care of hyper/hypoglycemia, proper foot care and importance of medication, aerobic/resistive exercise and nutrition plan for blood glucose control.;Long Term: Attainment of HbA1C < 7%.    Hypertension  Yes    Intervention  Provide education on lifestyle modifcations including regular physical activity/exercise, weight management, moderate sodium restriction and increased consumption of fresh fruit, vegetables, and low fat dairy, alcohol moderation, and smoking cessation.;Monitor prescription use compliance.    Expected Outcomes  Short Term: Continued assessment and intervention until BP is < 140/936mHG in hypertensive participants. < 130/8028mG in hypertensive participants with diabetes, heart failure or chronic kidney disease.;Long Term: Maintenance of blood pressure at goal levels.    Lipids  Yes    Intervention  Provide education and support for participant on nutrition & aerobic/resistive exercise along with prescribed medications to achieve LDL <19m67m  HDL >24m.    Expected Outcomes  Short Term: Participant states understanding of desired cholesterol values and is compliant with medications prescribed. Participant is following exercise prescription and nutrition guidelines.;Long Term: Cholesterol controlled with medications as prescribed, with individualized exercise RX and with personalized nutrition plan. Value goals: LDL < 772m HDL > 40 mg.       Core Components/Risk Factors/Patient Goals Review:  Goals and Risk Factor Review    Row Name 05/17/18 1648 05/31/18 1711           Core Components/Risk Factors/Patient  Goals Review   Personal Goals Review  Weight Management/Obesity;Diabetes;Hypertension;Lipids;Stress  Weight Management/Obesity;Diabetes;Hypertension;Lipids;Stress      Review  pt with multiple CAD RF demonstrates eagerness to participate in CR exercise, nutrition, and lifestyle modification opportunity.  pt personal goals are to increase strength/stamina,, decrease fatigue and lose weight.  pt enjoys playing golf and islooking forward to resuming in March per CVTS recommendation  Pt with multiple CAD RFs willing to participate in CR exericse. IkObie Dredges off to a good start.  Adjustments were made to original prescritpion d/t knee pain. The new exercise prescription is more comfortbale for Garrett Welch.       Expected Outcomes  pt will participate in CR exercise, nutrition and lifestyle modification opportunties to decrease overall RF.   Pt will participate in CR exercise, nutrition and lifestyle modification opportunties to decrease overall RF.          Core Components/Risk Factors/Patient Goals at Discharge (Final Review):  Goals and Risk Factor Review - 05/31/18 1711      Core Components/Risk Factors/Patient Goals Review   Personal Goals Review  Weight Management/Obesity;Diabetes;Hypertension;Lipids;Stress    Review  Pt with multiple CAD RFs willing to participate in CR exericse. IkObie Dredges off to a good start.  Adjustments were made to original prescritpion d/t knee pain. The new exercise prescription is more comfortbale for Garrett Welch.     Expected Outcomes  Pt will participate in CR exercise, nutrition and lifestyle modification opportunties to decrease overall RF.        ITP Comments: ITP Comments    Row Name 05/11/18 1030 05/17/18 1645 05/31/18 1710       ITP Comments  Dr. TrFransico HimMedical Director   pt started group exercise today. pt tolerated light activity without difficulty. pt oriented to exercise equipment and safety routine.  understanding verbalized  30 Day ITP Review.  IkObie Dredgeas started exercise  recently and has been tolerating it well.  Adjustments were made to original exercise prescription d/t knee pain.  This is more comfortable for him now.        Comments: See ITP Comments.

## 2018-06-02 ENCOUNTER — Encounter (HOSPITAL_COMMUNITY)
Admission: RE | Admit: 2018-06-02 | Discharge: 2018-06-02 | Disposition: A | Discharge status: Home / Self Care | Payer: BLUE CROSS/BLUE SHIELD | Source: Ambulatory Visit | Attending: Cardiovascular Disease | Admitting: Cardiovascular Disease

## 2018-06-02 DIAGNOSIS — Z951 Presence of aortocoronary bypass graft: Secondary | ICD-10-CM

## 2018-06-05 ENCOUNTER — Encounter (HOSPITAL_COMMUNITY): Payer: BLUE CROSS/BLUE SHIELD

## 2018-06-07 ENCOUNTER — Encounter (HOSPITAL_COMMUNITY)
Admission: RE | Admit: 2018-06-07 | Discharge: 2018-06-07 | Disposition: A | Discharge status: Home / Self Care | Payer: BLUE CROSS/BLUE SHIELD | Source: Ambulatory Visit | Attending: Cardiovascular Disease | Admitting: Cardiovascular Disease

## 2018-06-07 DIAGNOSIS — Z951 Presence of aortocoronary bypass graft: Secondary | ICD-10-CM | POA: Diagnosis not present

## 2018-06-09 ENCOUNTER — Encounter (HOSPITAL_COMMUNITY)
Admission: RE | Admit: 2018-06-09 | Discharge: 2018-06-09 | Disposition: A | Discharge status: Home / Self Care | Payer: BLUE CROSS/BLUE SHIELD | Source: Ambulatory Visit | Attending: Cardiovascular Disease | Admitting: Cardiovascular Disease

## 2018-06-09 ENCOUNTER — Telehealth: Payer: Self-pay | Admitting: Cardiovascular Disease

## 2018-06-09 DIAGNOSIS — Z951 Presence of aortocoronary bypass graft: Secondary | ICD-10-CM

## 2018-06-09 NOTE — Telephone Encounter (Signed)
New message   Patient would like to see Dr. Burt Knack for f/u from Bypass surgery. He does not want to see a PA. He states that he is supposed to see Dr. Burt Knack. Please advise.

## 2018-06-09 NOTE — Telephone Encounter (Signed)
Pt due to see Dr Burt Knack in March and first available is not til April Will forward to Lenice Llamas RN to make an appt and also pt needs letter for dentist saying okay to have teeth cleaned ./cy

## 2018-06-11 ENCOUNTER — Telehealth: Payer: Self-pay | Admitting: Internal Medicine

## 2018-06-11 NOTE — Telephone Encounter (Signed)
Notes reviewed. Will ask [name] to touch base with him next week to see how he's responded to H2 blockers. If persistent symptoms will bring him in for EKG and evaluation.

## 2018-06-11 NOTE — Telephone Encounter (Signed)
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  He states that he has been having significant reflux symptoms throughout the evening taking Mylanta and Prilosec which she had been taking both for with reflux symptoms with aspirin.  Wanted recommendations on whether to be evaluated for heart attack.  Patient does not have any H2 blockers at home and overall is feeling otherwise okay.  Did discuss picking up some famotidine and taking some more Mylanta and assessing symptoms.  If the do not improve he will need to go to the ER.  Patient demonstrated understanding.   [place]

## 2018-06-12 ENCOUNTER — Telehealth (HOSPITAL_COMMUNITY): Payer: Self-pay | Admitting: *Deleted

## 2018-06-12 ENCOUNTER — Encounter (HOSPITAL_COMMUNITY): Payer: BLUE CROSS/BLUE SHIELD

## 2018-06-12 NOTE — Telephone Encounter (Signed)
Scheduled the patient 2/24 with Dr. Burt Knack.  He was grateful for call and agrees with treatment plan.

## 2018-06-12 NOTE — Telephone Encounter (Signed)
She patient saw an internist today and is being worked up for gall bladder issues given his temp, abnormal stool, and location of his pain.  Scheduled him for routine evaluation with Dr. Burt Knack next week as the patient needs a letter from Dr. Burt Knack clearing him to have dental work.  He was grateful for call and agrees with treatment plan.

## 2018-06-14 ENCOUNTER — Telehealth (HOSPITAL_COMMUNITY): Payer: Self-pay | Admitting: Internal Medicine

## 2018-06-14 ENCOUNTER — Encounter (HOSPITAL_COMMUNITY): Payer: BLUE CROSS/BLUE SHIELD

## 2018-06-14 NOTE — Telephone Encounter (Signed)
Called patient after receiving message.  Pt is scheduled for f/u appt on Monday 06/19/2018.  Will follow along with patient to determine appropriateness to return to cardiac rehab.

## 2018-06-16 ENCOUNTER — Encounter (HOSPITAL_COMMUNITY): Payer: BLUE CROSS/BLUE SHIELD

## 2018-06-19 ENCOUNTER — Encounter (HOSPITAL_COMMUNITY): Payer: BLUE CROSS/BLUE SHIELD

## 2018-06-19 ENCOUNTER — Ambulatory Visit: Payer: BLUE CROSS/BLUE SHIELD | Admitting: Cardiovascular Disease

## 2018-06-21 ENCOUNTER — Encounter (HOSPITAL_COMMUNITY): Payer: BLUE CROSS/BLUE SHIELD

## 2018-06-22 NOTE — Progress Notes (Signed)
I have reviewed a Home Exercise Prescription with Garrett Welch is currently exercising at home. The patient was advised to continue to exercise 2-3 days a week for 30 minutes, using treadmill and recumbent bike.  Garrett Welch and I discussed how to progress their exercise prescription. The patient stated that their goals were to start strength training . The patient stated that they understand the exercise prescription. We reviewed exercise guidelines, target heart rate during exercise, RPE Scale, weather conditions, NTG use, endpoints for exercise, warmup and cool down. Patient is encouraged to come to me with any questions. I will continue to follow up with the patient to assist them with progression and safety.     Carma Lair MS, ACSM CEP 3:31 PM 06/07/2018

## 2018-06-23 ENCOUNTER — Encounter (HOSPITAL_COMMUNITY): Payer: BLUE CROSS/BLUE SHIELD

## 2018-06-26 ENCOUNTER — Encounter (HOSPITAL_COMMUNITY): Payer: BLUE CROSS/BLUE SHIELD

## 2018-06-26 ENCOUNTER — Telehealth (HOSPITAL_COMMUNITY): Payer: Self-pay | Admitting: *Deleted

## 2018-06-26 NOTE — Telephone Encounter (Signed)
Pt called for update.  He states he was told by the surgeon that he could return next Monday, 07/03/2018.  Request faxed to MD office for clearance. Pt informed clearance from MD office must be obtained before return.  Pt verbalized understanding.

## 2018-06-27 ENCOUNTER — Other Ambulatory Visit: Payer: BLUE CROSS/BLUE SHIELD

## 2018-06-28 ENCOUNTER — Encounter (HOSPITAL_COMMUNITY): Payer: BLUE CROSS/BLUE SHIELD

## 2018-06-28 NOTE — Telephone Encounter (Signed)
Pt called to inform rehab staff of "fluid around lungs."  Pt was previously hoping to restart on Monday but will instead start Wednesday 07/05/2018.  He has a procedure on Monday for this. Will follow along with patient.

## 2018-06-29 NOTE — Progress Notes (Signed)
Cardiac Individual Treatment Plan  Patient Details  Name: Garrett Welch MRN: [id number] Date of Birth: 10-Jul-1950 Referring Provider:     [place] from 05/11/2018 in [place]  Referring Provider  Sherren Mocha MD       Initial Encounter Date:    CARDIAC REHAB PHASE II ORIENTATION from 05/11/2018 in [place]  Date  05/11/18      Visit Diagnosis: S/P CABG x 2  Patient's Home Medications on Admission:  Current Outpatient Medications:  .  acetaminophen (TYLENOL) 500 MG tablet, Take 1,000 mg by mouth every 8 (eight) hours as needed (knee pain). , Disp: , Rfl:  .  albuterol (PROVENTIL HFA;VENTOLIN HFA) 108 (90 Base) MCG/ACT inhaler, Inhale 2 puffs into the lungs every 6 (six) hours as needed for wheezing or shortness of breath., Disp: , Rfl:  .  aspirin EC 325 MG EC tablet, Take 1 tablet (325 mg total) by mouth daily., Disp: 30 tablet, Rfl: 0 .  aspirin EC 81 MG tablet, Take 81 mg by mouth daily., Disp: , Rfl:  .  COLLAGEN PO, Take 2 capsules by mouth daily., Disp: , Rfl:  .  ferrous sulfate 325 (65 FE) MG EC tablet, Take 1 tablet (325 mg total) by mouth daily with breakfast., Disp: , Rfl: 3 .  fluticasone (FLONASE) 50 MCG/ACT nasal spray, Place 2 sprays into both nostrils daily as needed for allergies. , Disp: , Rfl:  .  irbesartan (AVAPRO) 75 MG tablet, Take 1 tablet (75 mg total) by mouth daily., Disp: 90 tablet, Rfl: 3 .  metFORMIN (GLUCOPHAGE) 500 MG tablet, Take 1,000 mg by mouth 2 (two) times daily. , Disp: , Rfl: 9 .  metoprolol tartrate (LOPRESSOR) 50 MG tablet, Take 0.5 tablets (25 mg total) by mouth 2 (two) times daily., Disp: 60 tablet, Rfl: 6 .  Multiple Vitamins-Minerals (MULTIVITAMIN WITH MINERALS) tablet, Take 1 tablet by mouth daily., Disp: , Rfl:  .  Omega-3 Fatty Acids (FISH OIL) 1000 MG CAPS, Take 1,000 mg by mouth daily., Disp: , Rfl:  .  Polyethyl Glycol-Propyl  Glycol (LUBRICANT EYE DROPS) 0.4-0.3 % SOLN, Place 1 drop into both eyes 3 (three) times daily as needed (for dry/irritated eyes.)., Disp: , Rfl:  .  pramipexole (MIRAPEX) 0.125 MG tablet, Take 0.125 mg by mouth at bedtime., Disp: , Rfl: 2 .  rosuvastatin (CRESTOR) 20 MG tablet, Take 1 tablet (20 mg total) by mouth daily. (Patient taking differently: Take 40 mg by mouth daily. ), Disp: 90 tablet, Rfl: 3 .  sodium fluoride (PREVIDENT 5000 PLUS) 1.1 % CREA dental cream, Take 1 application by mouth 2 (two) times daily. , Disp: , Rfl:  .  tamsulosin (FLOMAX) 0.4 MG CAPS capsule, Take 0.4 mg by mouth at bedtime. , Disp: , Rfl: 3  Past Medical History: Past Medical History:  Diagnosis Date  . Atrial flutter ([place]) 09/16/2017   S/p RFCA // EF was 30% in setting of AFl w/ RVR // EF improved in NSR ([place] 8/19: Mod conc LVH, EF 50-55, Gr 1 DD, mild dilated ascending aorta (40 mm), MAC, trivial TR)  . CAD (coronary artery disease) 04/06/2018   LHC 10/19: dLM 40-50, LAD 100 CTO, D1 99, LCx 99, RI 60; R-L collats, EF normal // s/p CABG 11/19 (L-LAD, RIMA-Dx)  . Cancer ([place])   . Diabetes mellitus without complication ([place])   . HTN (hypertension)   . Hyperlipidemia LDL goal <70   .  OA (osteoarthritis)    right knee  . PAC (premature atrial contraction)   . Pneumonia    walking pneumonia--yrs. ago  . Prostate cancer ([place])   . Sleep apnea     Tobacco Use: Social History   Tobacco Use  Smoking Status Never Smoker  Smokeless Tobacco Never Used    Labs: Recent Review Flowsheet Data    Labs for ITP Cardiac and Pulmonary Rehab Latest Ref Rng & Units 03/20/2018 03/20/2018 03/20/2018 03/20/2018 03/21/2018   Cholestrol 125 - 200 mg/dL - - - - -   LDLCALC <130 mg/dL - - - - -   HDL >=40 mg/dL - - - - -   Trlycerides <150 mg/dL - - - - -   Hemoglobin A1c 4.8 - 5.6 % - - - - -   PHART 7.350 - 7.450 7.352 7.372 7.379 - -   PCO2ART 32.0 - 48.0 mmHg 44.4 37.7 39.5 - -   HCO3 20.0 - 28.0 mmol/L 25.0 21.9  23.2 - -   TCO2 22 - 32 mmol/L _0 ACIDBASEDEF 0.0 - 2.0 mmol/L 1.0 3.0(H) 2.0 - -   O2SAT % 94.0 99.0 97.0 - -      Capillary Blood Glucose: Lab Results  Component Value Date   GLUCAP 128 (H) 05/19/2018   GLUCAP 143 (H) 05/19/2018   GLUCAP 116 (H) 05/17/2018   GLUCAP 124 (H) 03/24/2018   GLUCAP 115 (H) 03/23/2018     Exercise Target Goals: Exercise Program Goal: Individual exercise prescription set using results from initial 6 min walk test and THRR while considering  patient's activity barriers and safety.   Exercise Prescription Goal: Initial exercise prescription builds to 30-45 minutes a day of aerobic activity, 2-3 days per week.  Home exercise guidelines will be given to patient during program as part of exercise prescription that the participant will acknowledge.  Activity Barriers & Risk Stratification: Activity Barriers & Cardiac Risk Stratification - 05/11/18 1034      Activity Barriers & Cardiac Risk Stratification   Activity Barriers  Arthritis    Cardiac Risk Stratification  High       6 Minute Walk: 6 Minute Walk    Row Name 05/11/18 1031         6 Minute Walk   Phase  Initial     Distance  1617 feet     Walk Time  6 minutes     # of Rest Breaks  0     MPH  3.06     METS  2.98     RPE  11     Perceived Dyspnea   0     VO2 Peak  10.42     Symptoms  No     Resting HR  62 bpm     Resting BP  124/74     Resting Oxygen Saturation   98 %     Exercise Oxygen Saturation  during 6 min walk  98 %     Max Ex. HR  115 bpm     Max Ex. BP  128/80     2 Minute Post BP  124/84        Oxygen Initial Assessment:   Oxygen Re-Evaluation:   Oxygen Discharge (Final Oxygen Re-Evaluation):   Initial Exercise Prescription: Initial Exercise Prescription - 05/11/18 1000      Date of Initial Exercise RX and Referring Provider   Date  05/11/18    Referring Provider  Sherren Mocha MD     Expected Discharge Date  08/18/18      Treadmill    MPH  2.2    Grade  1    Minutes  10    METs  2.99      Recumbant Bike   Level  2    Watts  30    Minutes  10    METs  2.9      NuStep   Level  2    SPM  75    Minutes  10    METs  2.5      Prescription Details   Frequency (times per week)  3x    Duration  Progress to 30 minutes of continuous aerobic without signs/symptoms of physical distress      Intensity   THRR 40-80% of Max Heartrate  62-124    Ratings of Perceived Exertion  11-13    Perceived Dyspnea  0-4      Progression   Progression  Continue progressive overload as per policy without signs/symptoms or physical distress.      Resistance Training   Training Prescription  Yes    Weight  5lbs    Reps  10-15       Perform Capillary Blood Glucose checks as needed.  Exercise Prescription Changes: Exercise Prescription Changes    Row Name 05/17/18 0800 05/26/18 1000 06/09/18 1420         Response to Exercise   Blood Pressure (Admit)  130/72  144/84  128/80     Blood Pressure (Exercise)  150/72  132/74  126/58     Blood Pressure (Exit)  120/80  124/70  120/70     Heart Rate (Admit)  72 bpm  70 bpm  75 bpm     Heart Rate (Exercise)  95 bpm  112 bpm  113 bpm     Heart Rate (Exit)  59 bpm  73 bpm  73 bpm     Rating of Perceived Exertion (Exercise)  _0 Perceived Dyspnea (Exercise)  0  0  0     Symptoms  None  None  None     Comments  Pt oriented to exercise equipment  Pt doing 2 15 minute station due to chronic knee pain  None     Duration  Progress to 30 minutes of  aerobic without signs/symptoms of physical distress  Continue with 30 min of aerobic exercise without signs/symptoms of physical distress.  Continue with 30 min of aerobic exercise without signs/symptoms of physical distress.     Intensity  THRR unchanged  THRR unchanged  THRR unchanged       Progression   Progression  Continue to progress workloads to maintain intensity without signs/symptoms of physical distress.  Continue to progress  workloads to maintain intensity without signs/symptoms of physical distress.  Continue to progress workloads to maintain intensity without signs/symptoms of physical distress.     Average METs  2.99  2.94  3.03       Resistance Training   Training Prescription  No  Yes  Yes     Weight  -  5lbs  5lbs     Reps  -  10-15  10-15     Time  -  10 Minutes  10 Minutes       Treadmill   MPH  2.2  2.4  2.4     Grade  1  1  1     Minutes  _0 METs  2.99  3.17  3.17       Recumbant Bike   Level  2  -  -     Watts  30  -  -     Minutes  10  -  -     METs  2.5  -  -       NuStep   Level  _1 SPM  75  85  85     Minutes  _2 METs  2.5  2.7  2.9       Home Exercise Plan   Plans to continue exercise at  -  -  Home (comment)     Frequency  -  -  Add 2 additional days to program exercise sessions.     Initial Home Exercises Provided  -  -  06/07/18        Exercise Comments: Exercise Comments    Row Name 05/17/18 0803 06/07/18 1525 06/26/18 1047       Exercise Comments  Pt's first day of Cardiac Rehab. Pt oriented to exercise equipment. Pt responded well to workloads. Will continue to monitor and progress pt as tolerated.   Reviewed HEP with pt. Pt very motivated to exercise on his own. Will continue to monitor progress.   Pt currently on medical hold. Will continue to monitor pt's progress and status in reahb program.         Exercise Goals and Review: Exercise Goals    Row Name 05/11/18 1035             Exercise Goals   Increase Physical Activity  Yes       Intervention  Provide advice, education, support and counseling about physical activity/exercise needs.;Develop an individualized exercise prescription for aerobic and resistive training based on initial evaluation findings, risk stratification, comorbidities and participant's personal goals.       Expected Outcomes  Short Term: Attend rehab on a regular basis to increase amount of physical  activity.;Long Term: Add in home exercise to make exercise part of routine and to increase amount of physical activity.;Long Term: Exercising regularly at least 3-5 days a week.       Increase Strength and Stamina  Yes       Intervention  Provide advice, education, support and counseling about physical activity/exercise needs.;Develop an individualized exercise prescription for aerobic and resistive training based on initial evaluation findings, risk stratification, comorbidities and participant's personal goals.       Expected Outcomes  Short Term: Increase workloads from initial exercise prescription for resistance, speed, and METs.;Short Term: Perform resistance training exercises routinely during rehab and add in resistance training at home;Long Term: Improve cardiorespiratory fitness, muscular endurance and strength as measured by increased METs and functional capacity (6MWT)       Able to understand and use rate of perceived exertion (RPE) scale  Yes       Intervention  Provide education and explanation on how to use RPE scale       Expected Outcomes  Short Term: Able to use RPE daily in rehab to express subjective intensity level;Long Term:  Able to use RPE to guide intensity level when exercising independently       Knowledge and understanding of Target Heart Rate Range (THRR)  Yes  Intervention  Provide education and explanation of THRR including how the numbers were predicted and where they are located for reference       Expected Outcomes  Short Term: Able to state/look up THRR;Short Term: Able to use daily as guideline for intensity in rehab;Long Term: Able to use THRR to govern intensity when exercising independently       Able to check pulse independently  Yes       Intervention  Provide education and demonstration on how to check pulse in carotid and radial arteries.;Review the importance of being able to check your own pulse for safety during independent exercise       Expected  Outcomes  Short Term: Able to explain why pulse checking is important during independent exercise;Long Term: Able to check pulse independently and accurately       Understanding of Exercise Prescription  Yes       Intervention  Provide education, explanation, and written materials on patient's individual exercise prescription       Expected Outcomes  Short Term: Able to explain program exercise prescription;Long Term: Able to explain home exercise prescription to exercise independently          Exercise Goals Re-Evaluation : Exercise Goals Re-Evaluation    Row Name 06/07/18 1527             Exercise Goal Re-Evaluation   Exercise Goals Review  Increase Physical Activity;Able to understand and use rate of perceived exertion (RPE) scale;Knowledge and understanding of Target Heart Rate Range (THRR);Understanding of Exercise Prescription;Increase Strength and Stamina;Able to check pulse independently       Comments  Reviewed HEP with pt. Reveiwed RPE Scale, weather conditions, warmup and cool down, endpoints of exercise, NTG use, and THR Range.        Expected Outcomes  Pt will plan to exercise 2-3 days at home for 30-45 mins. Pt has access to treadmill and recumbent bike. Will continue to monitor pt.           Discharge Exercise Prescription (Final Exercise Prescription Changes): Exercise Prescription Changes - 06/09/18 1420      Response to Exercise   Blood Pressure (Admit)  128/80    Blood Pressure (Exercise)  126/58    Blood Pressure (Exit)  120/70    Heart Rate (Admit)  75 bpm    Heart Rate (Exercise)  113 bpm    Heart Rate (Exit)  73 bpm    Rating of Perceived Exertion (Exercise)  11    Perceived Dyspnea (Exercise)  0    Symptoms  None    Comments  None    Duration  Continue with 30 min of aerobic exercise without signs/symptoms of physical distress.    Intensity  THRR unchanged      Progression   Progression  Continue to progress workloads to maintain intensity without  signs/symptoms of physical distress.    Average METs  3.03      Resistance Training   Training Prescription  Yes    Weight  5lbs    Reps  10-15    Time  10 Minutes      Treadmill   MPH  2.4    Grade  1    Minutes  15    METs  3.17      NuStep   Level  4    SPM  85    Minutes  15    METs  2.9      Home Exercise Plan  Plans to continue exercise at  Home (comment)    Frequency  Add 2 additional days to program exercise sessions.    Initial Home Exercises Provided  06/07/18       Nutrition:  Target Goals: Understanding of nutrition guidelines, daily intake of sodium <1560m, cholesterol <2067m calories 30% from fat and 7% or less from saturated fats, daily to have 5 or more servings of fruits and vegetables.  Biometrics: Pre Biometrics - 05/11/18 1034      Pre Biometrics   Height  6' 2.25" (1.886 m)    Weight  (!) 146.8 kg    Waist Circumference  50.5 inches    Hip Circumference  52.5 inches    Waist to Hip Ratio  0.96 %    BMI (Calculated)  41.27    Triceps Skinfold  32 mm    % Body Fat  39.7 %    Grip Strength  36.5 kg    Flexibility  0 in    Single Leg Stand  5 seconds        Nutrition Therapy Plan and Nutrition Goals: Nutrition Therapy & Goals - 05/11/18 1135      Nutrition Therapy   Diet  heart healthy, carb modified      Personal Nutrition Goals   Nutrition Goal  Pt to identify and limit food sources of saturated fat, trans fat, refined carbohydrates and sodium    Personal Goal #2  Pt to identify food quantities necessary to achieve weight loss of 6-24 lbs. at graduation from cardiac rehab.    Personal Goal #3  Pt able to name foods that affect blood glucose.      Intervention Plan   Intervention  Prescribe, educate and counsel regarding individualized specific dietary modifications aiming towards targeted core components such as weight, hypertension, lipid management, diabetes, heart failure and other comorbidities.    Expected Outcomes  Short Term  Goal: Understand basic principles of dietary content, such as calories, fat, sodium, cholesterol and nutrients.;Long Term Goal: Adherence to prescribed nutrition plan.       Nutrition Assessments: Nutrition Assessments - 05/11/18 1136      MEDFICTS Scores   Pre Score  41       Nutrition Goals Re-Evaluation: Nutrition Goals Re-Evaluation    Row Name 05/11/18 1135             Goals   Current Weight  323 lb 10.2 oz (146.8 kg)          Nutrition Goals Re-Evaluation: Nutrition Goals Re-Evaluation    [place] 05/11/18 1135             Goals   Current Weight  323 lb 10.2 oz (146.8 kg)          Nutrition Goals Discharge (Final Nutrition Goals Re-Evaluation): Nutrition Goals Re-Evaluation - 05/11/18 1135      Goals   Current Weight  323 lb 10.2 oz (146.8 kg)       Psychosocial: Target Goals: Acknowledge presence or absence of significant depression and/or stress, maximize coping skills, provide positive support system. Participant is able to verbalize types and ability to use techniques and skills needed for reducing stress and depression.  Initial Review & Psychosocial Screening: Initial Psych Review & Screening - 05/11/18 1030      Initial Review   Current issues with  None Identified      Family Dynamics   Good Support System?  Yes   "Garrett Welch" lists his spouse, family, and coworkers  as sources of support.      Barriers   Psychosocial barriers to participate in program  There are no identifiable barriers or psychosocial needs.      Screening Interventions   Interventions  Encouraged to exercise       Quality of Life Scores: Quality of Life - 05/11/18 1030      Quality of Life   Select  Quality of Life      Quality of Life Scores   Health/Function Pre  25.2 %    Socioeconomic Pre  27.86 %    Psych/Spiritual Pre  28.93 %    Family Pre  28.3 %    GLOBAL Pre  26.97 %      Scores of 19 and below usually indicate a poorer quality of life in these areas.   A difference of  2-3 points is a clinically meaningful difference.  A difference of 2-3 points in the total score of the Quality of Life Index has been associated with significant improvement in overall quality of life, self-image, physical symptoms, and general health in studies assessing change in quality of life.  PHQ-9: Recent Review Flowsheet Data    Depression screen [facility] 2/9 02/14/2015 11/18/2014   Decreased Interest 0 0   Down, Depressed, Hopeless 0 0   PHQ - 2 Score 0 0     Interpretation of Total Score  Total Score Depression Severity:  1-4 = Minimal depression, 5-9 = Mild depression, 10-14 = Moderate depression, 15-19 = Moderately severe depression, 20-27 = Severe depression   Psychosocial Evaluation and Intervention: Psychosocial Evaluation - 06/26/18 1708      Psychosocial Evaluation & Interventions   Interventions  Encouraged to exercise with the program and follow exercise prescription    Comments  Pt currently on medical hold. No psychosocial needs identified.     Expected Outcomes  Pt will continue to exhibit positive outlook with good coping skills.     Continue Psychosocial Services   No Follow up required       Psychosocial Re-Evaluation: Psychosocial Re-Evaluation    [place] Name 05/31/18 1711 06/28/18 1654           Psychosocial Re-Evaluation   Current issues with  None Identified  None Identified      Comments  No psychosocial interventions necessary.  Garrett Welch is currently on medical hold. He maintains his positivity during this time as noted during phone conversations.       Expected Outcomes  Garrett Welch will continue to exhibit a positive outlook with good coping skills.   Garrett Welch will continue to exhibit a positive outlook with good coping skills.       Interventions  Encouraged to attend Cardiac Rehabilitation for the exercise  Encouraged to attend Cardiac Rehabilitation for the exercise      Continue Psychosocial Services   No Follow up required  No Follow up required          Psychosocial Discharge (Final Psychosocial Re-Evaluation): Psychosocial Re-Evaluation - 06/28/18 1654      Psychosocial Re-Evaluation   Current issues with  None Identified    Comments  Garrett Welch is currently on medical hold. He maintains his positivity during this time as noted during phone conversations.     Expected Outcomes  Garrett Welch will continue to exhibit a positive outlook with good coping skills.     Interventions  Encouraged to attend Cardiac Rehabilitation for the exercise    Continue Psychosocial Services   No Follow up required  Vocational Rehabilitation: Provide vocational rehab assistance to qualifying candidates.   Vocational Rehab Evaluation & Intervention: Vocational Rehab - 05/11/18 1030      Initial Vocational Rehab Evaluation & Intervention   Assessment shows need for Vocational Rehabilitation  No       Education: Education Goals: Education classes will be provided on a weekly basis, covering required topics. Participant will state understanding/return demonstration of topics presented.  Learning Barriers/Preferences: Learning Barriers/Preferences - 05/11/18 1050      Learning Barriers/Preferences   Learning Barriers  Hearing    Learning Preferences  Verbal Instruction;Skilled Demonstration;Written Material;Individual Instruction       Education Topics: Count Your Pulse:  -Group instruction provided by verbal instruction, demonstration, patient participation and written materials to support subject.  Instructors address importance of being able to find your pulse and how to count your pulse when at home without a heart monitor.  Patients get hands on experience counting their pulse with staff help and individually.   Heart Attack, Angina, and Risk Factor Modification:  -Group instruction provided by verbal instruction, video, and written materials to support subject.  Instructors address signs and symptoms of angina and heart attacks.    Also discuss  risk factors for heart disease and how to make changes to improve heart health risk factors.   Functional Fitness:  -Group instruction provided by verbal instruction, demonstration, patient participation, and written materials to support subject.  Instructors address safety measures for doing things around the house.  Discuss how to get up and down off the floor, how to pick things up properly, how to safely get out of a chair without assistance, and balance training.   Meditation and Mindfulness:  -Group instruction provided by verbal instruction, patient participation, and written materials to support subject.  Instructor addresses importance of mindfulness and meditation practice to help reduce stress and improve awareness.  Instructor also leads participants through a meditation exercise.    Stretching for Flexibility and Mobility:  -Group instruction provided by verbal instruction, patient participation, and written materials to support subject.  Instructors lead participants through series of stretches that are designed to increase flexibility thus improving mobility.  These stretches are additional exercise for major muscle groups that are typically performed during regular warm up and cool down.   Hands Only CPR:  -Group verbal, video, and participation provides a basic overview of AHA guidelines for community CPR. Role-play of emergencies allow participants the opportunity to practice calling for help and chest compression technique with discussion of AED use.   Hypertension: -Group verbal and written instruction that provides a basic overview of hypertension including the most recent diagnostic guidelines, risk factor reduction with self-care instructions and medication management.    Nutrition I class: Heart Healthy Eating:  -Group instruction provided by PowerPoint slides, verbal discussion, and written materials to support subject matter. The instructor gives an explanation and  review of the Therapeutic Lifestyle Changes diet recommendations, which includes a discussion on lipid goals, dietary fat, sodium, fiber, plant stanol/sterol esters, sugar, and the components of a well-balanced, healthy diet.   Nutrition II class: Lifestyle Skills:  -Group instruction provided by PowerPoint slides, verbal discussion, and written materials to support subject matter. The instructor gives an explanation and review of label reading, grocery shopping for heart health, heart healthy recipe modifications, and ways to make healthier choices when eating out.   Diabetes Question & Answer:  -Group instruction provided by PowerPoint slides, verbal discussion, and written materials to support subject matter. The  instructor gives an explanation and review of diabetes co-morbidities, pre- and post-prandial blood glucose goals, pre-exercise blood glucose goals, signs, symptoms, and treatment of hypoglycemia and hyperglycemia, and foot care basics.   Diabetes Blitz:  -Group instruction provided by PowerPoint slides, verbal discussion, and written materials to support subject matter. The instructor gives an explanation and review of the physiology behind type 1 and type 2 diabetes, diabetes medications and rational behind using different medications, pre- and post-prandial blood glucose recommendations and Hemoglobin A1c goals, diabetes diet, and exercise including blood glucose guidelines for exercising safely.    Portion Distortion:  -Group instruction provided by PowerPoint slides, verbal discussion, written materials, and food models to support subject matter. The instructor gives an explanation of serving size versus portion size, changes in portions sizes over the last 20 years, and what consists of a serving from each food group.   Stress Management:  -Group instruction provided by verbal instruction, video, and written materials to support subject matter.  Instructors review role of stress  in heart disease and how to cope with stress positively.     Exercising on Your Own:  -Group instruction provided by verbal instruction, power point, and written materials to support subject.  Instructors discuss benefits of exercise, components of exercise, frequency and intensity of exercise, and end points for exercise.  Also discuss use of nitroglycerin and activating EMS.  Review options of places to exercise outside of rehab.  Review guidelines for sex with heart disease.   Cardiac Drugs I:  -Group instruction provided by verbal instruction and written materials to support subject.  Instructor reviews cardiac drug classes: antiplatelets, anticoagulants, beta blockers, and statins.  Instructor discusses reasons, side effects, and lifestyle considerations for each drug class.   Cardiac Drugs II:  -Group instruction provided by verbal instruction and written materials to support subject.  Instructor reviews cardiac drug classes: angiotensin converting enzyme inhibitors (ACE-I), angiotensin II receptor blockers (ARBs), nitrates, and calcium channel blockers.  Instructor discusses reasons, side effects, and lifestyle considerations for each drug class.   Anatomy and Physiology of the Circulatory System:  Group verbal and written instruction and models provide basic cardiac anatomy and physiology, with the coronary electrical and arterial systems. Review of: AMI, Angina, Valve disease, Heart Failure, Peripheral Artery Disease, Cardiac Arrhythmia, Pacemakers, and the ICD.   Other Education:  -Group or individual verbal, written, or video instructions that support the educational goals of the cardiac rehab program.   Holiday Eating Survival Tips:  -Group instruction provided by PowerPoint slides, verbal discussion, and written materials to support subject matter. The instructor gives patients tips, tricks, and techniques to help them not only survive but enjoy the holidays despite the onslaught  of food that accompanies the holidays.   Knowledge Questionnaire Score: Knowledge Questionnaire Score - 05/11/18 1028      Knowledge Questionnaire Score   Pre Score  19/24       Core Components/Risk Factors/Patient Goals at Admission: Personal Goals and Risk Factors at Admission - 05/11/18 1051      Core Components/Risk Factors/Patient Goals on Admission    Weight Management  Yes    Intervention  Weight Management: Develop a combined nutrition and exercise program designed to reach desired caloric intake, while maintaining appropriate intake of nutrient and fiber, sodium and fats, and appropriate energy expenditure required for the weight goal.;Weight Management: Provide education and appropriate resources to help participant work on and attain dietary goals.;Weight Management/Obesity: Establish reasonable short term and long term weight goals.;Obesity:  Provide education and appropriate resources to help participant work on and attain dietary goals.    Admit Weight  323 lb 10.2 oz (146.8 kg)    Goal Weight: Short Term  315 lb (142.9 kg)    Goal Weight: Long Term  300 lb (136.1 kg)    Expected Outcomes  Short Term: Continue to assess and modify interventions until short term weight is achieved;Long Term: Adherence to nutrition and physical activity/exercise program aimed toward attainment of established weight goal;Weight Loss: Understanding of general recommendations for a balanced deficit meal plan, which promotes 1-2 lb weight loss per week and includes a negative energy balance of [phone] kcal/d;Understanding of distribution of calorie intake throughout the day with the consumption of 4-5 meals/snacks;Understanding recommendations for meals to include 15-35% energy as protein, 25-35% energy from fat, 35-60% energy from carbohydrates, less than 268m of dietary cholesterol, 20-35 gm of total fiber daily    Diabetes  Yes    Intervention  Provide education about signs/symptoms and action to  take for hypo/hyperglycemia.;Provide education about proper nutrition, including hydration, and aerobic/resistive exercise prescription along with prescribed medications to achieve blood glucose in normal ranges: Fasting glucose 65-99 mg/dL    Expected Outcomes  Short Term: Participant verbalizes understanding of the signs/symptoms and immediate care of hyper/hypoglycemia, proper foot care and importance of medication, aerobic/resistive exercise and nutrition plan for blood glucose control.;Long Term: Attainment of HbA1C < 7%.    Hypertension  Yes    Intervention  Provide education on lifestyle modifcations including regular physical activity/exercise, weight management, moderate sodium restriction and increased consumption of fresh fruit, vegetables, and low fat dairy, alcohol moderation, and smoking cessation.;Monitor prescription use compliance.    Expected Outcomes  Short Term: Continued assessment and intervention until BP is < 140/967mHG in hypertensive participants. < 130/8025mG in hypertensive participants with diabetes, heart failure or chronic kidney disease.;Long Term: Maintenance of blood pressure at goal levels.    Lipids  Yes    Intervention  Provide education and support for participant on nutrition & aerobic/resistive exercise along with prescribed medications to achieve LDL <22m30mDL >40mg39m Expected Outcomes  Short Term: Participant states understanding of desired cholesterol values and is compliant with medications prescribed. Participant is following exercise prescription and nutrition guidelines.;Long Term: Cholesterol controlled with medications as prescribed, with individualized exercise RX and with personalized nutrition plan. Value goals: LDL < 22mg,4m > 40 mg.       Core Components/Risk Factors/Patient Goals Review:  Goals and Risk Factor Review    Row Name 05/17/18 1648 05/31/18 1711 06/26/18 1709         Core Components/Risk Factors/Patient Goals Review   Personal  Goals Review  Weight Management/Obesity;Diabetes;Hypertension;Lipids;Stress  Weight Management/Obesity;Diabetes;Hypertension;Lipids;Stress  Weight Management/Obesity;Diabetes;Hypertension;Lipids;Stress     Review  pt with multiple CAD RF demonstrates eagerness to participate in CR exercise, nutrition, and lifestyle modification opportunity.  pt personal goals are to increase strength/stamina,, decrease fatigue and lose weight.  pt enjoys playing golf and islooking forward to resuming in March per CVTS recommendation  Pt with multiple CAD RFs willing to participate in CR exericse. Garrett Welch isObie Dredgef to a good start.  Adjustments were made to original prescritpion d/t knee pain. The new exercise prescription is more comfortbale for Garrett Welch.   Pt is currently on medical hold.  He is looking to restart CR next week.  He was tolerating exercise prior to being out.      Expected Outcomes  pt will participate in  CR exercise, nutrition and lifestyle modification opportunties to decrease overall RF.   Pt will participate in CR exercise, nutrition and lifestyle modification opportunties to decrease overall RF.   Pt will participate in CR exercise, nutrition and lifestyle modification opportunties to decrease overall RF.         Core Components/Risk Factors/Patient Goals at Discharge (Final Review):  Goals and Risk Factor Review - 06/26/18 1709      Core Components/Risk Factors/Patient Goals Review   Personal Goals Review  Weight Management/Obesity;Diabetes;Hypertension;Lipids;Stress    Review  Pt is currently on medical hold.  He is looking to restart CR next week.  He was tolerating exercise prior to being out.     Expected Outcomes  Pt will participate in CR exercise, nutrition and lifestyle modification opportunties to decrease overall RF.        ITP Comments: ITP Comments    Row Name 05/11/18 1030 05/17/18 1645 05/31/18 1710 06/26/18 1709     ITP Comments  Dr. Fransico Him, Medical Director   pt started group  exercise today. pt tolerated light activity without difficulty. pt oriented to exercise equipment and safety routine.  understanding verbalized  30 Day ITP Review.  Garrett Welch has started exercise recently and has been tolerating it well.  Adjustments were made to original exercise prescription d/t knee pain.  This is more comfortable for him now.  30 Day ITP Review.  Pt is currently on medical hold.  He is looking to restart CR next week.  He was tolerating exercise prior to being out.        Comments: See ITP Comments.

## 2018-06-30 ENCOUNTER — Encounter (HOSPITAL_COMMUNITY): Payer: BLUE CROSS/BLUE SHIELD

## 2018-07-03 ENCOUNTER — Encounter (HOSPITAL_COMMUNITY): Payer: BLUE CROSS/BLUE SHIELD

## 2018-07-05 ENCOUNTER — Encounter (HOSPITAL_COMMUNITY): Payer: BLUE CROSS/BLUE SHIELD

## 2018-07-05 ENCOUNTER — Telehealth (HOSPITAL_COMMUNITY): Payer: Self-pay | Admitting: *Deleted

## 2018-07-05 NOTE — Telephone Encounter (Signed)
Following up with Obie Dredge who had a right thoracentesis at [facility] on Monday.  Reviewed notes in Care Everywhere.  Pt had 370 ml of fluid removed from his right lung.  This was sent off for various lab testing. Pt in good spirits and has no complaints.  Shortness of Breath has resolve. Pt will see his internal med MD - Dr. Patrick Jupiter on this coming Monday 3/162020. Pt will get clearance to return back to Cardiac Rehab for exercise. Thanked me for the call. Cherre Huger, BSN Cardiac and Training and development officer

## 2018-07-07 ENCOUNTER — Encounter (HOSPITAL_COMMUNITY): Payer: BLUE CROSS/BLUE SHIELD

## 2018-07-10 ENCOUNTER — Telehealth (HOSPITAL_COMMUNITY): Payer: Self-pay | Admitting: *Deleted

## 2018-07-10 ENCOUNTER — Encounter (HOSPITAL_COMMUNITY): Payer: BLUE CROSS/BLUE SHIELD

## 2018-07-10 NOTE — Telephone Encounter (Signed)
Pt called about closure of cardiac rehab for two weeks. No answer. VM left.

## 2018-07-12 ENCOUNTER — Encounter (HOSPITAL_COMMUNITY): Payer: BLUE CROSS/BLUE SHIELD

## 2018-07-14 ENCOUNTER — Encounter (HOSPITAL_COMMUNITY): Payer: BLUE CROSS/BLUE SHIELD

## 2018-07-17 ENCOUNTER — Telehealth (HOSPITAL_COMMUNITY): Payer: Self-pay | Admitting: *Deleted

## 2018-07-17 ENCOUNTER — Encounter (HOSPITAL_COMMUNITY): Payer: BLUE CROSS/BLUE SHIELD

## 2018-07-17 NOTE — Telephone Encounter (Signed)
Pt called and updated about extended closure of cardiac rehab for four weeks d/t CoVID-19.  Tentative reopen date of 08/14/2018.  Pt verbalized understanding.  Pt states that he is currently working out at home for 30 minutes 3 days a week. He is working out using a treadmill, recumbent bike, and lifting 10lb weights.  Garrett Welch is eager to return to cardiac rehab.

## 2018-07-18 ENCOUNTER — Telehealth (HOSPITAL_COMMUNITY): Payer: Self-pay

## 2018-07-18 NOTE — Telephone Encounter (Signed)
Phone call to patient for nutrition education and counseling. Pt is currently working at this time, expressed the desire for a set scheduled time to talk for 30 minutes. Agreed to call patient for a 30 minute Nutrition consult at 2:30 pm today Tuesday 07/18/2018. Education materials and contact information distributed at this time.   Laurina Bustle

## 2018-07-18 NOTE — Telephone Encounter (Signed)
Garrett Welch 68 y.o. male Nutrition Note Spoke with pt. Nutrition Plan and Nutrition Survey goals reviewed with pt. Pt is following a Heart Healthy diet. Pt wants to lose wt. Pt has been trying to lose wt by eating in a mediterranean style. Heart healthy eating  (Mediterranean) and weight loss tips reviewed (label reading, how to build a healthy plate, portion sizes, eating frequently across the day). Pt last A1C was elevated. Discussed the differences between complex and refined carbs, recommended pt replace refined carbs with complex. Reviewed the benefits swapping in complex carbs and moderating portion sizes can have on managing blood glucose with patient. Feels he is rediscovering foods that he enjoy, tastes and texture preferences have changed since his surgery. Discussed slowly reintroducing foods, and using nutritional supplements to help meet caloric needs. Discussed attempting to build a healthy place, focusing on lean protein, healthy fats, non-starchy veggies, complex carbs. Pt expressed understanding of the information reviewed. Pt aware of nutrition education classes offered and does plan on attending nutrition classes when cardiac rehab resumes.  Lab Results  Component Value Date   HGBA1C 6.4 (H) 03/16/2018    Wt Readings from Last 3 Encounters:  05/11/18 (!) 323 lb 10.2 oz (146.8 kg)  04/26/18 (!) 324 lb (147 kg)  04/24/18 (!) 324 lb (147 kg)    Nutrition Diagnosis ? Food-and nutrition-related knowledge deficit related to lack of exposure to information as related to diagnosis of: ? CVD ? Obese  Welch = 40+ related to excessive energy intake as evidenced by a There is no height or weight on file to calculate BMI.  Nutrition Intervention ? Pt's individual nutrition plan reviewed with pt. ? Benefits of adopting Heart Healthy diet discussed when Medficts reviewed.    Goal(s) ? Pt to identify and limit food sources of saturated fat, trans fat, refined carbohydrates  and sodium ? Pt to identify food quantities necessary to achieve weight loss of 6-24 lb at graduation from cardiac rehab.  ? Pt to build a healthy plate including vegetables, fruits, whole grains, and low-fat dairy products in a heart healthy meal plan. ? Pt able to name foods that affect blood glucose.  Plan:   Pt to attend nutrition classes ? Nutrition I ? Nutrition II ? Portion Distortion   Will provide client-centered nutrition education as part of interdisciplinary care  Monitor and evaluate progress toward nutrition goal with team.    Laurina Bustle, MS, RD, LDN 07/18/2018 2:52 PM

## 2018-07-19 ENCOUNTER — Encounter (HOSPITAL_COMMUNITY): Payer: BLUE CROSS/BLUE SHIELD

## 2018-07-21 ENCOUNTER — Encounter (HOSPITAL_COMMUNITY): Payer: BLUE CROSS/BLUE SHIELD

## 2018-07-24 ENCOUNTER — Telehealth: Payer: Self-pay | Admitting: Cardiovascular Disease

## 2018-07-24 ENCOUNTER — Encounter (HOSPITAL_COMMUNITY): Payer: BLUE CROSS/BLUE SHIELD

## 2018-07-24 NOTE — Telephone Encounter (Signed)
New Message   Patient calling for instructions to set up webex for Evisit.

## 2018-07-24 NOTE — Telephone Encounter (Signed)
Called patient to set up Webex app and give instructions, there was no answer, left message on voicemail .

## 2018-07-25 ENCOUNTER — Encounter

## 2018-07-25 NOTE — Telephone Encounter (Signed)
Mr Garrett Welch is all set for the Web ex meeting , he has good audio and video.

## 2018-07-26 ENCOUNTER — Encounter: Payer: Self-pay | Admitting: Cardiovascular Disease

## 2018-07-26 ENCOUNTER — Other Ambulatory Visit: Payer: Self-pay

## 2018-07-26 ENCOUNTER — Encounter (HOSPITAL_COMMUNITY): Payer: BLUE CROSS/BLUE SHIELD

## 2018-07-26 ENCOUNTER — Telehealth (INDEPENDENT_AMBULATORY_CARE_PROVIDER_SITE_OTHER): Payer: BLUE CROSS/BLUE SHIELD | Admitting: Cardiovascular Disease

## 2018-07-26 ENCOUNTER — Other Ambulatory Visit: Payer: BLUE CROSS/BLUE SHIELD

## 2018-07-26 VITALS — BP 106/65 | HR 99 | Wt 279.0 lb

## 2018-07-26 DIAGNOSIS — I10 Essential (primary) hypertension: Secondary | ICD-10-CM

## 2018-07-26 DIAGNOSIS — I2510 Atherosclerotic heart disease of native coronary artery without angina pectoris: Secondary | ICD-10-CM

## 2018-07-26 DIAGNOSIS — E782 Mixed hyperlipidemia: Secondary | ICD-10-CM

## 2018-07-26 NOTE — Progress Notes (Signed)
Virtual Visit via Video Note    Evaluation Performed:  Follow-up visit  This visit type was conducted due to national recommendations for restrictions regarding the COVID-19 Pandemic (e.g. social distancing).  This format is felt to be most appropriate for this patient at this time.  All issues noted in this document were discussed and addressed.  No physical exam was performed (except for noted visual exam findings with Video Visits).  Please refer to the patient's chart (MyChart message for video visits and phone note for telephone visits) for the patient's consent to telehealth for [facility].  Date:  07/26/2018   ID:  Garrett Welch, DOB [birth year], MRN [id number]  Patient Location:  home  Provider location:   [place] office  PCP:  Tempie Hoist, MD  Cardiologist:  Sherren Mocha, MD  Electrophysiologist:  [name], MD   Chief Complaint:  CAD  History of Present Illness:    Garrett Welch is a 68 y.o. male who presents via audio/video conferencing for a telehealth visit today.    The patient does not have symptoms concerning for COVID-19 infection (fever, chills, cough, or new shortness of breath).   The patient is interviewed via a webex video conference.  He is doing okay from a cardiac perspective.  He was participating in cardiac rehab until he developed acute cholecystitis in February when he had to undergo emergency cholecystectomy.  He feels that he still recovering from this and having some trouble with intolerance to certain foods.  He denies any cardiac related symptoms and specifically denies chest pain, shortness of breath, or heart palpitations.  He denies edema, orthopnea, or PND.  He asks about when he can start increasing his activity level.   Prior CV studies:   The following studies were reviewed today:  TEE 03/20/2018 (intraop): Left Ventricle Normal cavity size. LV systolic function is low normal with an EF of 50-55%.  Septum  Normal atrial septum with no atrial septal defect and normal septal motion. No Patent Foramen Ovale present.  Left Atrium Left atrium normal in structure and function.  Aortic Valve Normal valve structure.  Mitral Valve Normal valve structure.     Past Medical History:  Diagnosis Date  . Atrial flutter ([place]) 09/16/2017   S/p RFCA // EF was 30% in setting of AFl w/ RVR // EF improved in NSR (Echo 8/19: Mod conc LVH, EF 50-55, Gr 1 DD, mild dilated ascending aorta (40 mm), MAC, trivial TR)  . CAD (coronary artery disease) 04/06/2018   LHC 10/19: dLM 40-50, LAD 100 CTO, D1 99, LCx 99, RI 60; R-L collats, EF normal // s/p CABG 11/19 (L-LAD, RIMA-Dx)  . Cancer ([place])   . Diabetes mellitus without complication ([place])   . HTN (hypertension)   . Hyperlipidemia LDL goal <70   . OA (osteoarthritis)    right knee  . PAC (premature atrial contraction)   . Pneumonia    walking pneumonia--yrs. ago  . Prostate cancer ([place])   . Sleep apnea    Past Surgical History:  Procedure Laterality Date  . A-FLUTTER ABLATION N/A 10/18/2017   Procedure: A-FLUTTER ABLATION;  Surgeon: Evans Lance, MD;  Location: [place] [facility];  Service: Cardiovascular;  Laterality: N/A;  . 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 CORONARY ARTERY BYPASS GRAFT N/A 03/20/2018   Procedure: CORONARY ARTERY BYPASS GRAFTING (CABG)  times two using bilateral internal mammary arteries. Plating  of sternal fracture.;  Surgeon: [name], MD;  Location: [facility] OR;  Service: Open Heart Surgery;  Laterality: N/A;  with bilateral IMA  . GASTRIC BYPASS     20 yrs. ago  . LEFT HEART CATH AND CORONARY ANGIOGRAPHY N/A 02/22/2018   Procedure: LEFT HEART CATH AND CORONARY ANGIOGRAPHY;  Surgeon: Sherren Mocha, MD;  Location: [place] [facility];  Service: Cardiovascular;  Laterality: N/A;  . prostate radioactive seeds implant 2009    . RADIAL ARTERY HARVEST N/A 03/20/2018   Procedure:  possible RADIAL ARTERY HARVEST;  Surgeon: [name], MD;  Location: [place];  Service: Open Heart Surgery;  Laterality: N/A;  . TEE WITHOUT CARDIOVERSION N/A 03/20/2018   Procedure: TRANSESOPHAGEAL ECHOCARDIOGRAM (TEE);  Surgeon: [name], MD;  Location: [place];  Service: Open Heart Surgery;  Laterality: N/A;     Current Meds  Medication Sig  . albuterol (PROVENTIL HFA;VENTOLIN HFA) 108 (90 Base) MCG/ACT inhaler Inhale 2 puffs into the lungs every 6 (six) hours as needed for wheezing or shortness of breath.  Marland Kitchen aspirin EC 81 MG tablet Take 81 mg by mouth daily.  . COLLAGEN PO Take 2 capsules by mouth daily.  . ferrous sulfate 325 (65 FE) MG EC tablet Take 1 tablet (325 mg total) by mouth daily with breakfast.  . fluticasone (FLONASE) 50 MCG/ACT nasal spray Place 2 sprays into both nostrils daily as needed for allergies.   Marland Kitchen irbesartan (AVAPRO) 75 MG tablet Take 1 tablet (75 mg total) by mouth daily.  . metFORMIN (GLUCOPHAGE) 500 MG tablet Take 1,000 mg by mouth 2 (two) times daily.   . metoprolol tartrate (LOPRESSOR) 50 MG tablet Take 0.5 tablets (25 mg total) by mouth 2 (two) times daily.  . Multiple Vitamins-Minerals (MULTIVITAMIN WITH MINERALS) tablet Take 1 tablet by mouth daily.  Vladimir Faster Glycol-Propyl Glycol (LUBRICANT EYE DROPS) 0.4-0.3 % SOLN Place 1 drop into both eyes 3 (three) times daily as needed (for dry/irritated eyes.).  Marland Kitchen pramipexole (MIRAPEX) 0.125 MG tablet Take 0.125 mg by mouth at bedtime.  . rosuvastatin (CRESTOR) 20 MG tablet Take 40 mg by mouth daily.  . sodium fluoride (PREVIDENT 5000 PLUS) 1.1 % CREA dental cream Take 1 application by mouth 2 (two) times daily.   . tamsulosin (FLOMAX) 0.4 MG CAPS capsule Take 0.4 mg by mouth at bedtime.      Allergies:   Tape; Tramadol; and Amoxicillin-pot clavulanate   Social History   Tobacco Use  . Smoking status: Never Smoker  . Smokeless tobacco: Never Used  Substance Use Topics  . Alcohol use: Yes     Comment: occ  . Drug use: Never     Family Hx: The patient's family history includes Cancer in his father; Heart attack in his mother; Heart disease in his mother; Heart failure in his mother; Hypertension in his maternal grandmother and mother; Kidney failure in his father; Stroke in his father.  ROS:   Please see the history of present illness.    All other systems reviewed and are negative.   Labs/Other Tests and Data Reviewed:    Recent Labs: 09/16/2017: TSH 1.622 03/16/2018: ALT 20 03/21/2018: Magnesium 1.9 03/22/2018: BUN 12; Creatinine, Ser 0.90; Hemoglobin 12.0; Platelets 166; Potassium 4.0; Sodium 135   Recent Lipid Panel Lab Results  Component Value Date/Time   CHOL 141 06/05/2015 08:55 AM   TRIG 65 06/05/2015 08:55 AM   HDL 47 06/05/2015 08:55 AM   CHOLHDL 3.0 06/05/2015 08:55 AM  [place] 81 06/05/2015 08:55 AM    Wt Readings from Last 3 Encounters:  07/26/18 279 lb (126.6 kg)  05/11/18 (!) 323 lb 10.2 oz (146.8 kg)  04/26/18 (!) 324 lb (147 kg)     Objective:    Vital Signs:  BP 106/65   Pulse 99   Wt 279 lb (126.6 kg)   BMI 35.58 kg/m    Well nourished, well developed male in no acute distress. Further exam not performed because this is a telemedicine visit.  ASSESSMENT & PLAN:    1.  Coronary artery disease, native vessel, without angina: The patient is stable after undergoing coronary bypass surgery now about 4 months ago.  He is cleared to participate in regular exercise, return to cardiac rehab, and undergo dental work, as these are all specific questions he had today.  His medical program is reviewed and no changes are recommended.  2.  Essential hypertension: Blood pressure is well controlled on his current medical program.  3.  Mixed dyslipidemia: Treated with a statin drug.  Most recent labs reviewed.  4.  Right pleural effusion: The patient had a thoracentesis and reports bland fluid analysis.  He is having no further shortness of breath.   He has been followed by pulmonology as well as his primary care physician.  COVID-19 Education: The signs and symptoms of COVID-19 were discussed with the patient and how to seek care for testing (follow up with PCP or arrange E-visit).  The importance of social distancing was discussed today.  Patient Risk:   After full review of this patient's clinical status, I feel that they are at least moderate risk at this time.  Time:   Today, I have spent 14 minutes with the patient with telehealth technology discussing issues outlined above.     Medication Adjustments/Labs and Tests Ordered: Current medicines are reviewed at length with the patient today.  Concerns regarding medicines are outlined above.  Tests Ordered: No orders of the defined types were placed in this encounter.  Medication Changes: No orders of the defined types were placed in this encounter.   Disposition:  Follow up in 6 month(s)  Signed, Sherren Mocha, MD  07/26/2018 10:32 AM    [place]

## 2018-07-28 ENCOUNTER — Encounter (HOSPITAL_COMMUNITY): Payer: BLUE CROSS/BLUE SHIELD

## 2018-07-31 ENCOUNTER — Encounter (HOSPITAL_COMMUNITY): Payer: BLUE CROSS/BLUE SHIELD

## 2018-08-02 ENCOUNTER — Telehealth (HOSPITAL_COMMUNITY): Payer: Self-pay | Admitting: Cardiac Rehabilitation

## 2018-08-02 ENCOUNTER — Encounter (HOSPITAL_COMMUNITY): Payer: BLUE CROSS/BLUE SHIELD

## 2018-08-02 NOTE — Telephone Encounter (Signed)
Pt phone call to advise of continued Outpatient  Cardiac Rehab departmental closure for COVID 19 precautions.  Unknown date for reopening. Pt advised to continue exercising on her own, following home exercise guidelines.  Pt instructed to notify MD PRN symptoms.  Left message on voicemail.   Andi Hence, RN, BSN Cardiac Pulmonary Rehab

## 2018-08-04 ENCOUNTER — Encounter (HOSPITAL_COMMUNITY): Payer: BLUE CROSS/BLUE SHIELD

## 2018-08-07 ENCOUNTER — Encounter (HOSPITAL_COMMUNITY): Payer: BLUE CROSS/BLUE SHIELD

## 2018-08-09 ENCOUNTER — Encounter (HOSPITAL_COMMUNITY): Payer: BLUE CROSS/BLUE SHIELD

## 2018-08-11 ENCOUNTER — Encounter (HOSPITAL_COMMUNITY): Payer: BLUE CROSS/BLUE SHIELD

## 2018-08-14 ENCOUNTER — Encounter (HOSPITAL_COMMUNITY): Payer: BLUE CROSS/BLUE SHIELD

## 2018-08-16 ENCOUNTER — Encounter (HOSPITAL_COMMUNITY): Payer: BLUE CROSS/BLUE SHIELD

## 2018-08-23 ENCOUNTER — Other Ambulatory Visit: Payer: Self-pay

## 2018-08-23 MED ORDER — METOPROLOL TARTRATE 50 MG PO TABS: 25.0000 mg | ORAL_TABLET | Freq: Two times a day (BID) | ORAL | 8 refills | Status: DC

## 2018-08-24 ENCOUNTER — Other Ambulatory Visit: Payer: Self-pay

## 2018-08-24 MED ORDER — METOPROLOL TARTRATE 50 MG PO TABS: 25.0000 mg | ORAL_TABLET | Freq: Two times a day (BID) | ORAL | 8 refills | Status: DC

## 2018-09-19 ENCOUNTER — Telehealth (HOSPITAL_COMMUNITY): Payer: Self-pay | Admitting: *Deleted

## 2018-09-19 NOTE — Telephone Encounter (Signed)
PC to patient regarding virtual cardiac rehab option.  No answer. VM left for pt to return call to CR.

## 2018-09-21 ENCOUNTER — Telehealth (HOSPITAL_COMMUNITY): Payer: Self-pay | Admitting: *Deleted

## 2018-09-21 NOTE — Telephone Encounter (Signed)
No response from message left on voicemail regarding Virtual Cardiac Rehab for the continuation of her participation.  Please contact letter sent to address on file in epic. Await response, if no response by June 12th will discharge pt from the program.  Carlette Carlton RN, BSN Cardiac and Pulmonary Rehab Nurse Navigator   

## 2018-09-25 ENCOUNTER — Telehealth (HOSPITAL_COMMUNITY): Payer: Self-pay

## 2018-09-25 NOTE — Telephone Encounter (Signed)
Attempted to contact pt in regards to an appt for our Virtual Cardiac Rehab, LMTCB.

## 2018-10-16 NOTE — Progress Notes (Signed)
Discharge Progress Report  Patient Details  Name: Garrett Welch MRN: [id number] Date of Birth: 12-28-1950 Referring Provider:     [place] from 05/11/2018 in [place]  Referring Provider  Sherren Mocha MD        Number of Visits: 9  Reason for Discharge:  Early Exit:  Unable to reach patient.   Smoking History:  Social History   Tobacco Use  Smoking Status Never Smoker  Smokeless Tobacco Never Used    Diagnosis:  S/P CABG x 2  ADL UCSD:   Initial Exercise Prescription: Initial Exercise Prescription - 05/11/18 1000      Date of Initial Exercise RX and Referring Provider   Date  05/11/18    Referring Provider  Sherren Mocha MD     Expected Discharge Date  08/18/18      Treadmill   MPH  2.2    Grade  1    Minutes  10    METs  2.99      Recumbant Bike   Level  2    Watts  30    Minutes  10    METs  2.9      NuStep   Level  2    SPM  75    Minutes  10    METs  2.5      Prescription Details   Frequency (times per week)  3x    Duration  Progress to 30 minutes of continuous aerobic without signs/symptoms of physical distress      Intensity   THRR 40-80% of Max Heartrate  62-124    Ratings of Perceived Exertion  11-13    Perceived Dyspnea  0-4      Progression   Progression  Continue progressive overload as per policy without signs/symptoms or physical distress.      Resistance Training   Training Prescription  Yes    Weight  5lbs    Reps  10-15       Discharge Exercise Prescription (Final Exercise Prescription Changes): Exercise Prescription Changes - 06/09/18 1420      Response to Exercise   Blood Pressure (Admit)  128/80    Blood Pressure (Exercise)  126/58    Blood Pressure (Exit)  120/70    Heart Rate (Admit)  75 bpm    Heart Rate (Exercise)  113 bpm    Heart Rate (Exit)  73 bpm    Rating of Perceived Exertion (Exercise)  11    Perceived Dyspnea (Exercise)  0     Symptoms  None    Comments  None    Duration  Continue with 30 min of aerobic exercise without signs/symptoms of physical distress.    Intensity  THRR unchanged      Progression   Progression  Continue to progress workloads to maintain intensity without signs/symptoms of physical distress.    Average METs  3.03      Resistance Training   Training Prescription  Yes    Weight  5lbs    Reps  10-15    Time  10 Minutes      Treadmill   MPH  2.4    Grade  1    Minutes  15    METs  3.17      NuStep   Level  4    SPM  85    Minutes  15    METs  2.9  Home Exercise Plan   Plans to continue exercise at  Home (comment)    Frequency  Add 2 additional days to program exercise sessions.    Initial Home Exercises Provided  06/07/18       Functional Capacity: 6 Minute Walk    Row Name 05/11/18 1031         6 Minute Walk   Phase  Initial     Distance  1617 feet     Walk Time  6 minutes     # of Rest Breaks  0     MPH  3.06     METS  2.98     RPE  11     Perceived Dyspnea   0     VO2 Peak  10.42     Symptoms  No     Resting HR  62 bpm     Resting BP  124/74     Resting Oxygen Saturation   98 %     Exercise Oxygen Saturation  during 6 min walk  98 %     Max Ex. HR  115 bpm     Max Ex. BP  128/80     2 Minute Post BP  124/84        Psychological, QOL, Others - Outcomes: PHQ 2/9: Depression screen [facility] 2/9 02/14/2015 11/18/2014  Decreased Interest 0 0  Down, Depressed, Hopeless 0 0  PHQ - 2 Score 0 0    Quality of Life: Quality of Life - 05/11/18 1030      Quality of Life   Select  Quality of Life      Quality of Life Scores   Health/Function Pre  25.2 %    Socioeconomic Pre  27.86 %    Psych/Spiritual Pre  28.93 %    Family Pre  28.3 %    GLOBAL Pre  26.97 %       Personal Goals: Goals established at orientation with interventions provided to work toward goal. Personal Goals and Risk Factors at Admission - 05/11/18 1051      Core Components/Risk  Factors/Patient Goals on Admission    Weight Management  Yes    Intervention  Weight Management: Develop a combined nutrition and exercise program designed to reach desired caloric intake, while maintaining appropriate intake of nutrient and fiber, sodium and fats, and appropriate energy expenditure required for the weight goal.;Weight Management: Provide education and appropriate resources to help participant work on and attain dietary goals.;Weight Management/Obesity: Establish reasonable short term and long term weight goals.;Obesity: Provide education and appropriate resources to help participant work on and attain dietary goals.    Admit Weight  323 lb 10.2 oz (146.8 kg)    Goal Weight: Short Term  315 lb (142.9 kg)    Goal Weight: Long Term  300 lb (136.1 kg)    Expected Outcomes  Short Term: Continue to assess and modify interventions until short term weight is achieved;Long Term: Adherence to nutrition and physical activity/exercise program aimed toward attainment of established weight goal;Weight Loss: Understanding of general recommendations for a balanced deficit meal plan, which promotes 1-2 lb weight loss per week and includes a negative energy balance of [phone] kcal/d;Understanding of distribution of calorie intake throughout the day with the consumption of 4-5 meals/snacks;Understanding recommendations for meals to include 15-35% energy as protein, 25-35% energy from fat, 35-60% energy from carbohydrates, less than 200mg  of dietary cholesterol, 20-35 gm of total fiber daily    Diabetes  Yes    Intervention  Provide education about signs/symptoms and action to take for hypo/hyperglycemia.;Provide education about proper nutrition, including hydration, and aerobic/resistive exercise prescription along with prescribed medications to achieve blood glucose in normal ranges: Fasting glucose 65-99 mg/dL    Expected Outcomes  Short Term: Participant verbalizes understanding of the signs/symptoms and  immediate care of hyper/hypoglycemia, proper foot care and importance of medication, aerobic/resistive exercise and nutrition plan for blood glucose control.;Long Term: Attainment of HbA1C < 7%.    Hypertension  Yes    Intervention  Provide education on lifestyle modifcations including regular physical activity/exercise, weight management, moderate sodium restriction and increased consumption of fresh fruit, vegetables, and low fat dairy, alcohol moderation, and smoking cessation.;Monitor prescription use compliance.    Expected Outcomes  Short Term: Continued assessment and intervention until BP is < 140/75mm HG in hypertensive participants. < 130/54mm HG in hypertensive participants with diabetes, heart failure or chronic kidney disease.;Long Term: Maintenance of blood pressure at goal levels.    Lipids  Yes    Intervention  Provide education and support for participant on nutrition & aerobic/resistive exercise along with prescribed medications to achieve LDL 70mg , HDL >40mg .    Expected Outcomes  Short Term: Participant states understanding of desired cholesterol values and is compliant with medications prescribed. Participant is following exercise prescription and nutrition guidelines.;Long Term: Cholesterol controlled with medications as prescribed, with individualized exercise RX and with personalized nutrition plan. Value goals: LDL < 70mg , HDL > 40 mg.        Personal Goals Discharge: Goals and Risk Factor Review    Row Name 05/17/18 1648 05/31/18 1711 06/26/18 1709 07/19/18 1054       Core Components/Risk Factors/Patient Goals Review   Personal Goals Review  Weight Management/Obesity;Diabetes;Hypertension;Lipids;Stress  Weight Management/Obesity;Diabetes;Hypertension;Lipids;Stress  Weight Management/Obesity;Diabetes;Hypertension;Lipids;Stress  Weight Management/Obesity;Diabetes;Hypertension;Lipids;Stress    Review  pt with multiple CAD RF demonstrates eagerness to participate in CR  exercise, nutrition, and lifestyle modification opportunity.  pt personal goals are to increase strength/stamina,, decrease fatigue and lose weight.  pt enjoys playing golf and islooking forward to resuming in March per CVTS recommendation  Pt with multiple CAD RFs willing to participate in CR exericse. Obie Dredge is off to a good start.  Adjustments were made to original prescritpion d/t knee pain. The new exercise prescription is more comfortbale for Ike.   Pt is currently on medical hold.  He is looking to restart CR next week.  He was tolerating exercise prior to being out.   30 Day ITP Review.  Pt is currently exercising at home while cardiac rehab is on hold d/t federal recommendations regarding CoVID.  Pt states that he is tolerating exercising at home.    Expected Outcomes  pt will participate in CR exercise, nutrition and lifestyle modification opportunties to decrease overall RF.   Pt will participate in CR exercise, nutrition and lifestyle modification opportunties to decrease overall RF.   Pt will participate in CR exercise, nutrition and lifestyle modification opportunties to decrease overall RF.   Pt will participate in CR exercise, nutrition and lifestyle modification opportunties to decrease overall RF.        Exercise Goals and Review: Exercise Goals    Row Name 05/11/18 1035             Exercise Goals   Increase Physical Activity  Yes       Intervention  Provide advice, education, support and counseling about physical activity/exercise needs.;Develop an individualized exercise prescription for aerobic and resistive  training based on initial evaluation findings, risk stratification, comorbidities and participant's personal goals.       Expected Outcomes  Short Term: Attend rehab on a regular basis to increase amount of physical activity.;Long Term: Add in home exercise to make exercise part of routine and to increase amount of physical activity.;Long Term: Exercising regularly at least 3-5  days a week.       Increase Strength and Stamina  Yes       Intervention  Provide advice, education, support and counseling about physical activity/exercise needs.;Develop an individualized exercise prescription for aerobic and resistive training based on initial evaluation findings, risk stratification, comorbidities and participant's personal goals.       Expected Outcomes  Short Term: Increase workloads from initial exercise prescription for resistance, speed, and METs.;Short Term: Perform resistance training exercises routinely during rehab and add in resistance training at home;Long Term: Improve cardiorespiratory fitness, muscular endurance and strength as measured by increased METs and functional capacity (6MWT)       Able to understand and use rate of perceived exertion (RPE) scale  Yes       Intervention  Provide education and explanation on how to use RPE scale       Expected Outcomes  Short Term: Able to use RPE daily in rehab to express subjective intensity level;Long Term:  Able to use RPE to guide intensity level when exercising independently       Knowledge and understanding of Target Heart Rate Range (THRR)  Yes       Intervention  Provide education and explanation of THRR including how the numbers were predicted and where they are located for reference       Expected Outcomes  Short Term: Able to state/look up THRR;Short Term: Able to use daily as guideline for intensity in rehab;Long Term: Able to use THRR to govern intensity when exercising independently       Able to check pulse independently  Yes       Intervention  Provide education and demonstration on how to check pulse in carotid and radial arteries.;Review the importance of being able to check your own pulse for safety during independent exercise       Expected Outcomes  Short Term: Able to explain why pulse checking is important during independent exercise;Long Term: Able to check pulse independently and accurately        Understanding of Exercise Prescription  Yes       Intervention  Provide education, explanation, and written materials on patient's individual exercise prescription       Expected Outcomes  Short Term: Able to explain program exercise prescription;Long Term: Able to explain home exercise prescription to exercise independently          Exercise Goals Re-Evaluation: Exercise Goals Re-Evaluation    Row Name 06/07/18 1527             Exercise Goal Re-Evaluation   Exercise Goals Review  Increase Physical Activity;Able to understand and use rate of perceived exertion (RPE) scale;Knowledge and understanding of Target Heart Rate Range (THRR);Understanding of Exercise Prescription;Increase Strength and Stamina;Able to check pulse independently       Comments  Reviewed HEP with pt. Reveiwed RPE Scale, weather conditions, warmup and cool down, endpoints of exercise, NTG use, and THR Range.        Expected Outcomes  Pt will plan to exercise 2-3 days at home for 30-45 mins. Pt has access to treadmill and recumbent bike. Will continue to monitor  pt.           Nutrition & Weight - Outcomes: Pre Biometrics - 05/11/18 1034      Pre Biometrics   Height  6' 2.25" (1.886 m)    Weight  (!) 146.8 kg    Waist Circumference  50.5 inches    Hip Circumference  52.5 inches    Waist to Hip Ratio  0.96 %    BMI (Calculated)  41.27    Triceps Skinfold  32 mm    % Body Fat  39.7 %    Grip Strength  36.5 kg    Flexibility  0 in    Single Leg Stand  5 seconds        Nutrition: Nutrition Therapy & Goals - 05/11/18 1135      Nutrition Therapy   Diet  heart healthy, carb modified      Personal Nutrition Goals   Nutrition Goal  Pt to identify and limit food sources of saturated fat, trans fat, refined carbohydrates and sodium    Personal Goal #2  Pt to identify food quantities necessary to achieve weight loss of 6-24 lbs. at graduation from cardiac rehab.    Personal Goal #3  Pt able to name foods that  affect blood glucose.      Intervention Plan   Intervention  Prescribe, educate and counsel regarding individualized specific dietary modifications aiming towards targeted core components such as weight, hypertension, lipid management, diabetes, heart failure and other comorbidities.    Expected Outcomes  Short Term Goal: Understand basic principles of dietary content, such as calories, fat, sodium, cholesterol and nutrients.;Long Term Goal: Adherence to prescribed nutrition plan.       Nutrition Discharge: Nutrition Assessments - 05/11/18 1136      MEDFICTS Scores   Pre Score  41       Education Questionnaire Score: Knowledge Questionnaire Score - 05/11/18 1028      Knowledge Questionnaire Score   Pre Score  19/24       Goals reviewed with patient; copy given to patient.

## 2018-10-16 NOTE — Addendum Note (Signed)
Encounter addended by: Noel Christmas, RN on: 10/16/2018 1:47 PM  Actions taken: Clinical Note Signed

## 2018-12-14 ENCOUNTER — Telehealth: Payer: Self-pay | Admitting: Internal Medicine

## 2018-12-14 NOTE — Telephone Encounter (Signed)
New Message  [name] is calling from Dr. Ethel Rana office. She states that the patient was there on yesterday and had a dental cleaning. They went ahead and saw him and they are wanting a note saying that it was okay.    1. What dental office are you calling from? Dr. Ethel Rana  2. What is your office phone number? [phone]  3. What is your fax number? [phone]  4. What type of procedure is the patient having performed? Dental cleaning   5. What date is procedure scheduled or is the patient there now? Patient was seen on 8/19  (if the patient is at the dentist's office question goes to their cardiologist if he/she is in the office.  If not, question should go to the DOD).   6. What is your question (ex. Antibiotics prior to procedure, holding medication-we need to know how long dentist wants pt to hold med)? No

## 2018-12-14 NOTE — Telephone Encounter (Signed)
   Primary Cardiologist: Sherren Mocha, MD  Chart reviewed as part of pre-operative protocol coverage. Simple dental extractions are considered low risk procedures per guidelines and generally do not require any specific cardiac clearance. It is also generally accepted that for simple extractions and dental cleanings, there is no need to interrupt blood thinner therapy.   SBE prophylaxis is not required for the patient.  I will route this recommendation to the requesting party via Epic fax function and remove from pre-op pool.  Please call with questions.  Kathyrn Drown, NP 12/14/2018, 4:10 PM

## 2019-01-22 NOTE — Progress Notes (Signed)
Cardiology Office Note    Date:  01/23/2019   ID:  Garrett Welch, DOB 1950-08-16, MRN [id number]  PCP:  Tempie Hoist, MD  Cardiologist:  Dr. Burt Knack EP: Dr. Lovena Le  Chief Complaint: 6  Months follow up  History of Present Illness:   Garrett Welch is a 68 y.o. male with coronary artery disease, atrial flutter s/p ablation 10/2017, tachycardia induced cardiomyopathy, diabetes, hypertension, hyperlipidemia, prostate CA, obesity s/p prior gastric bypass seen for follow up.   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  He developed reduced LVF with an EF of 30% in the setting of atrial flutter with RVR.  His EF returned to normal at 50-55% after RFCA restoring normal sinus rhythm. He was seen by Dr. Lovena Le (last 10/2017)>> maintaining sinus rhythm. Stopped anticoagulation.   Cardiac Catheterization 01/2018  demonstrated mild to mod distal LM disease (40-50%), CTO of the LAD with R-L collaterals, subtotal occlusion of the D1 and LCx and mod stenosis of the RI.  There were no PCI options and he was referred for CABG. Subsequently underwent CABG x 2 with Dr. Cyndia Bent (L-LAD, free RIMA to Dx) 02/2018. Post bypass course was fairly uneventful aside from bradycardia requiring reduction in his beta-blocker dose.  He underwent emergency cholecystectomy for acute cholecystitis 05/2018. He was doing well on cardiac stand point when last seen by Dr. Burt Knack on 07/2018 virtually.   Here today for follow up. He exercise 5 days/week. No chest pain or dyspnea. LDL was 62 on 08/2018 by PCP. Reviewed labs in [place]. The patient denies nausea, vomiting, fever, chest pain, palpitations, shortness of breath, orthopnea, PND, dizziness, syncope, cough, congestion, abdominal pain, hematochezia, melena, lower extremity edema.   Past Medical History:  Diagnosis Date  . Atrial flutter (Garrett Welch) 09/16/2017   S/p RFCA // EF was 30% in  setting of AFl w/ RVR // EF improved in NSR (Echo 8/19: Mod conc LVH, EF 50-55, Gr 1 DD, mild dilated ascending aorta (40 mm), MAC, trivial TR)  . CAD (coronary artery disease) 04/06/2018   LHC 10/19: dLM 40-50, LAD 100 CTO, D1 99, LCx 99, RI 60; R-L collats, EF normal // s/p CABG 11/19 (L-LAD, RIMA-Dx)  . Cancer ([place])   . Diabetes mellitus without complication ([place])   . HTN (hypertension)   . Hyperlipidemia LDL goal <70   . OA (osteoarthritis)    right knee  . PAC (premature atrial contraction)   . Pneumonia    walking pneumonia--yrs. ago  . Prostate cancer (Garrett Welch)   . Sleep apnea     Past Surgical History:  Procedure Laterality Date  . A-FLUTTER ABLATION N/A 10/18/2017   Procedure: A-FLUTTER ABLATION;  Surgeon: Evans Lance, MD;  Location: [facility];  Service: Cardiovascular;  Laterality: N/A;  . CARDIAC CATHETERIZATION  09/2014   [facility] in [place], LAD 100%, anomalous CFX 40-50%, RCA patent  . CORONARY ARTERY BYPASS GRAFT N/A 03/20/2018   Procedure: CORONARY ARTERY BYPASS GRAFTING (CABG)  times two using bilateral internal mammary arteries. Plating of sternal fracture.;  Surgeon: [name], MD;  Location: [facility];  Service: Open Heart Surgery;  Laterality: N/A;  with bilateral IMA  . GASTRIC BYPASS     20 yrs. ago  . LEFT HEART CATH AND CORONARY ANGIOGRAPHY N/A 02/22/2018   Procedure: LEFT HEART CATH AND CORONARY ANGIOGRAPHY;  Surgeon: Sherren Mocha, MD;  Location: [facility];  Service: Cardiovascular;  Laterality: N/A;  . prostate radioactive seeds implant 2009    . RADIAL ARTERY HARVEST N/A 03/20/2018   Procedure: possible RADIAL ARTERY HARVEST;  Surgeon: [name], MD;  Location: [facility];  Service: Open Heart Surgery;  Laterality: N/A;  . TEE WITHOUT CARDIOVERSION N/A 03/20/2018   Procedure: TRANSESOPHAGEAL ECHOCARDIOGRAM (TEE);  Surgeon: [name], MD;  Location: [facility];  Service: Open Heart Surgery;  Laterality: N/A;    Current Medications: Prior  to Admission medications   Medication Sig Start Date End Date Taking? Authorizing Provider  albuterol (PROVENTIL HFA;VENTOLIN HFA) 108 (90 Base) MCG/ACT inhaler Inhale 2 puffs into the lungs every 6 (six) hours as needed for wheezing or shortness of breath.    [provider]  aspirin EC 81 MG tablet Take 81 mg by mouth daily.    [provider]  COLLAGEN PO Take 2 capsules by mouth daily.    [provider]  ferrous sulfate 325 (65 FE) MG EC tablet Take 1 tablet (325 mg total) by mouth daily with breakfast. 03/24/18   Lars Pinks M, PA-C  fluticasone (FLONASE) 50 MCG/ACT nasal spray Place 2 sprays into both nostrils daily as needed for allergies.     [provider]  irbesartan (AVAPRO) 75 MG tablet Take 1 tablet (75 mg total) by mouth daily. 11/30/17   Evans Lance, MD  metFORMIN (GLUCOPHAGE) 500 MG tablet Take 1,000 mg by mouth 2 (two) times daily.  09/16/14   [provider]  metoprolol tartrate (LOPRESSOR) 50 MG tablet Take 0.5 tablets (25 mg total) by mouth 2 (two) times daily. 08/24/18   Barrett, Evelene Croon, PA-C  Multiple Vitamins-Minerals (MULTIVITAMIN WITH MINERALS) tablet Take 1 tablet by mouth daily.    [provider]  Polyethyl Glycol-Propyl Glycol (LUBRICANT EYE DROPS) 0.4-0.3 % SOLN Place 1 drop into both eyes 3 (three) times daily as needed (for dry/irritated eyes.).    [provider]  pramipexole (MIRAPEX) 0.125 MG tablet Take 0.125 mg by mouth at bedtime. 08/27/14   [provider]  rosuvastatin (CRESTOR) 20 MG tablet Take 40 mg by mouth daily.    [provider]  sodium fluoride (PREVIDENT 5000 PLUS) 1.1 % CREA dental cream Take 1 application by mouth 2 (two) times daily.  07/01/14   [provider]  tamsulosin (FLOMAX) 0.4 MG CAPS capsule Take 0.4 mg by mouth at bedtime.  09/18/14   [provider]    Allergies:   Tape, Tramadol, and Amoxicillin-pot clavulanate   Social History    Socioeconomic History  . Marital status: Married    Spouse name: Not on file  . Number of children: Not on file  . Years of education: Not on file  . Highest education level: Not on file  Occupational History  . Occupation: Theme park manager  Social Needs  . Financial resource strain: Not on file  . Food insecurity    Worry: Not on file    Inability: Not on file  . Transportation needs    Medical: Not on file    Non-medical: Not on file  Tobacco Use  . Smoking status: Never Smoker  . Smokeless tobacco: Never Used  Substance and Sexual Activity  . Alcohol use: Yes    Comment: occ  . Drug use: Never  . Sexual activity: Not on file  Lifestyle  . Physical activity    Days per week: Not on file    Minutes per session: Not on file  .  Stress: Not on file  Relationships  . Social Herbalist on phone: Not on file    Gets together: Not on file    Attends religious service: Not on file    Active member of club or organization: Not on file    Attends meetings of clubs or organizations: Not on file    Relationship status: Not on file  Other Topics Concern  . 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      Family History:  The patient's family history includes Cancer in his father; Heart attack in his mother; Heart disease in his mother; Heart failure in his mother; Hypertension in his maternal grandmother and mother; Kidney failure in his father; Stroke in his father.   ROS:   Please see the history of present illness.    ROS All other systems reviewed and are negative.   PHYSICAL EXAM:   VS:  BP 136/74   Pulse (!) 41   Ht _0  (1.905 m)   Wt (!) 331 lb 12.8 oz (150.5 kg)   SpO2 97%   BMI 41.47 kg/m    GEN: Well nourished, well developed, in no acute distress  HEENT: normal  Neck: no JVD, carotid bruits, or masses Cardiac: Irregular ; no murmurs, rubs, or gallops,no edema  Respiratory:  clear to auscultation bilaterally,  normal work of breathing GI: soft, nontender, nondistended, + BS MS: no deformity or atrophy  Skin: warm and dry, no rash Neuro:  Alert and Oriented x 3, Strength and sensation are intact Psych: euthymic mood, full affect  Wt Readings from Last 3 Encounters:  01/23/19 (!) 331 lb 12.8 oz (150.5 kg)  07/26/18 279 lb (126.6 kg)  05/11/18 (!) 323 lb 10.2 oz (146.8 kg)      Studies/Labs Reviewed:   EKG:  EKG is ordered today.  The ekg ordered today demonstrates sinus bradycardia rate of 49 bpm, PAC  Recent Labs: 03/16/2018: ALT 20 03/21/2018: Magnesium 1.9 03/22/2018: BUN 12; Creatinine, Ser 0.90; Hemoglobin 12.0; Platelets 166; Potassium 4.0; Sodium 135   Lipid Panel    Component Value Date/Time   CHOL 141 06/05/2015 0855   TRIG 65 06/05/2015 0855   HDL 47 06/05/2015 0855   CHOLHDL 3.0 06/05/2015 0855   VLDL 13 06/05/2015 0855   LDLCALC 81 06/05/2015 0855    Additional studies/ records that were reviewed today include:   LEFT HEART CATH AND CORONARY ANGIOGRAPHY 01/2018  Conclusion    Prox RCA to Mid RCA lesion is 25% stenosed.  Post Atrio lesion is 50% stenosed.  Mid LM to Dist LM lesion is 50% stenosed.  Ost Ramus to Ramus lesion is 60% stenosed.  Ost Cx to Prox Cx lesion is 99% stenosed.  Ost LAD to Prox LAD lesion is 95% stenosed.  Prox LAD lesion is 100% stenosed.  Ost 1st Diag lesion is 99% stenosed.  The left ventricular ejection fraction is 50-55% by visual estimate.   1.  Mild to moderate distal left main stenosis estimated at 40 to 50% 2.  Chronic total occlusion of the proximal LAD with right to left collaterals supplying the mid and distal LAD territory 3.  Subtotal occlusion of the first diagonal branch in the left circumflex 4.  Moderate stenosis of the large ramus intermedius branch estimated at 60% 5.  Patent RCA with mild nonobstructive stenosis (large dominant vessel) 6.  Mild segmental contraction abnormality the left ventricle with  preserved overall LV systolic function  Recommend: The patient does not have typical symptoms of angina.  However he has moderate ischemia on stress testing and based on assessment of his coronary anatomy as at large burden of potential ischemia involving the LAD, diagonal branch, circumflex, and intermediate branches.  I do not think he has any feasible PCI options.  I am going to refer him for an outpatient cardiac surgical evaluation for consideration of CABG versus ongoing medical therapy.    Echo 11/2017 Study Conclusions  - Left ventricle: The cavity size was normal. There was moderate   concentric hypertrophy. Systolic function was normal. The   estimated ejection fraction was in the range of 50% to 55%.   Doppler parameters are consistent with abnormal left ventricular   relaxation (grade 1 diastolic dysfunction). Doppler parameters   are consistent with elevated ventricular end-diastolic filling   pressure. - Aortic valve: There was no regurgitation. - Ascending aorta: The ascending aorta was mildly dilated measuring   40 mm. - Mitral valve: Calcified annulus. Mildly thickened leaflets . - Right ventricle: The cavity size was normal. Wall thickness was   normal. Systolic pressure was within the normal range. - Tricuspid valve: There was trivial regurgitation. - Pulmonary arteries: Systolic pressure was within the normal   range. - Inferior vena cava: The vessel was normal in size. The   respirophasic diameter changes were in the normal range (>= 50%),   consistent with normal central venous pressure.  Impressions:  - Since the last study on 09/28/2017 LVEF has improved from 30-35% to   50-55%.    ASSESSMENT & PLAN:    1. CAD s/p CABG - No angina. Continue ASA, statin and BB.   2. Paroxysmal atrial flutter s/p Ablation 09/2017 - Maintaining sinus rhythm but slow rate. Reports sometime no energy. Reduce metoprolol to 12.25m BID.   3. HTN - BP stable. Advised to  check periodically.   4. HLD - LDL 62 08/2018. Continue statin. Managed by PCP.    Medication Adjustments/Labs and Tests Ordered: Current medicines are reviewed at length with the patient today.  Concerns regarding medicines are outlined above.  Medication changes, Labs and Tests ordered today are listed in the Patient Instructions below. Patient Instructions  Medication Instructions:  Your physician has recommended you make the following change in your medication:  1.  REDUCE the Metoprolol to 1/2 tablet a day.  I will send in a new prescription for this dose good for a year   If you need a refill on your cardiac medications before your next appointment, please call your pharmacy.   Lab work: None ordered  If you have labs (blood work) drawn today and your tests are completely normal, you will receive your results only by: .Marland KitchenMyChart Message (if you have MyChart) OR . A paper copy in the mail If you have any lab test that is abnormal or we need to change your treatment, we will call you to review the results.  Testing/Procedures: None ordered  Follow-Up: At [facility] you and your health needs are our priority.  As part of our continuing mission to provide you with exceptional heart care, we have created designated Provider Care Teams.  These Care Teams include your primary Cardiologist (physician) and Advanced Practice Providers (APPs -  Physician Assistants and Nurse Practitioners) who all work together to provide you with the care you need, when you need it. You will need a follow up appointment in:  12 months.  Please call our office 2 months in advance to schedule this appointment.  You may see Sherren Mocha, MD or one of the following Advanced Practice Providers on your designated Care Team: Richardson Dopp, PA-C [place], [place] . Daune Perch, NP  Any Other Special Instructions Will Be Listed Below (If Applicable).       Jarrett Soho, [place]  01/23/2019  1:32 PM    [facility] [place], [place], [place]  [address] Phone: [phone]; Fax: [phone]

## 2019-01-23 ENCOUNTER — Ambulatory Visit (INDEPENDENT_AMBULATORY_CARE_PROVIDER_SITE_OTHER): Payer: BC Managed Care – PPO | Admitting: Physician Assistant

## 2019-01-23 ENCOUNTER — Encounter: Payer: Self-pay | Admitting: Physician Assistant

## 2019-01-23 ENCOUNTER — Other Ambulatory Visit: Payer: Self-pay

## 2019-01-23 VITALS — BP 136/74 | HR 41 | Ht 75.0 in | Wt 331.8 lb

## 2019-01-23 DIAGNOSIS — I499 Cardiac arrhythmia, unspecified: Secondary | ICD-10-CM | POA: Diagnosis not present

## 2019-01-23 DIAGNOSIS — I2510 Atherosclerotic heart disease of native coronary artery without angina pectoris: Secondary | ICD-10-CM | POA: Diagnosis not present

## 2019-01-23 DIAGNOSIS — E785 Hyperlipidemia, unspecified: Secondary | ICD-10-CM

## 2019-01-23 DIAGNOSIS — I4892 Unspecified atrial flutter: Secondary | ICD-10-CM | POA: Diagnosis not present

## 2019-01-23 DIAGNOSIS — I10 Essential (primary) hypertension: Secondary | ICD-10-CM

## 2019-01-23 MED ORDER — METOPROLOL TARTRATE 25 MG PO TABS: 12.5000 mg | ORAL_TABLET | Freq: Two times a day (BID) | ORAL | 3 refills | Status: DC

## 2019-01-23 NOTE — Patient Instructions (Addendum)
Medication Instructions:  Your physician has recommended you make the following change in your medication:  1.  REDUCE the Metoprolol to 1/2 tablet twice  a day.  I will send in a new prescription for this dose good for a year   If you need a refill on your cardiac medications before your next appointment, please call your pharmacy.   Lab work: None ordered  If you have labs (blood work) drawn today and your tests are completely normal, you will receive your results only by: Marland Kitchen MyChart Message (if you have MyChart) OR . A paper copy in the mail If you have any lab test that is abnormal or we need to change your treatment, we will call you to review the results.  Testing/Procedures: None ordered  Follow-Up: At [facility], you and your health needs are our priority.  As part of our continuing mission to provide you with exceptional heart care, we have created designated Provider Care Teams.  These Care Teams include your primary Cardiologist (physician) and Advanced Practice Providers (APPs -  Physician Assistants and Nurse Practitioners) who all work together to provide you with the care you need, when you need it. You will need a follow up appointment in:  12 months.  Please call our office 2 months in advance to schedule this appointment.  You may see Sherren Mocha, MD or one of the following Advanced Practice Providers on your designated Care Team: Richardson Dopp, PA-C [place], [place] . Daune Perch, NP  Any Other Special Instructions Will Be Listed Below (If Applicable).

## 2019-02-14 MED ORDER — IRBESARTAN 75 MG PO TABS: 75.0000 mg | ORAL_TABLET | Freq: Every day | ORAL | 3 refills | Status: DC

## 2019-04-12 MED ORDER — ROSUVASTATIN CALCIUM 40 MG PO TABS: 40.0000 mg | ORAL_TABLET | Freq: Every day | ORAL | 3 refills | Status: DC

## 2019-04-13 ENCOUNTER — Other Ambulatory Visit: Payer: Self-pay | Admitting: Cardiovascular Disease

## 2019-05-22 NOTE — Telephone Encounter (Signed)
Called patient in response to my chart message.  He realized that he misunderstood Vin's recommendations while he was re-reading his notes.  He had cut metoprolol back to 12.5 mg BID back in September after visit but just recently started reducing rosuvastatin also.  He realized on his own and has gone back to 40 mg daily starting today.  Thanked me for call to clarifiy.

## 2020-01-29 ENCOUNTER — Other Ambulatory Visit: Payer: Self-pay | Admitting: Cardiovascular Disease

## 2020-02-21 ENCOUNTER — Other Ambulatory Visit: Payer: Self-pay | Admitting: Cardiovascular Disease

## 2020-03-01 ENCOUNTER — Other Ambulatory Visit: Payer: Self-pay | Admitting: Cardiovascular Disease

## 2020-03-04 ENCOUNTER — Telehealth: Payer: Self-pay

## 2020-03-04 ENCOUNTER — Other Ambulatory Visit: Payer: Self-pay

## 2020-03-04 ENCOUNTER — Other Ambulatory Visit: Payer: Self-pay | Admitting: Cardiovascular Disease

## 2020-03-04 ENCOUNTER — Telehealth (INDEPENDENT_AMBULATORY_CARE_PROVIDER_SITE_OTHER): Payer: Medicare Other | Admitting: Physician Assistant

## 2020-03-04 ENCOUNTER — Encounter: Payer: Self-pay | Admitting: Physician Assistant

## 2020-03-04 VITALS — BP 133/78 | Ht 75.0 in | Wt 341.0 lb

## 2020-03-04 DIAGNOSIS — I4892 Unspecified atrial flutter: Secondary | ICD-10-CM

## 2020-03-04 DIAGNOSIS — I2510 Atherosclerotic heart disease of native coronary artery without angina pectoris: Secondary | ICD-10-CM | POA: Diagnosis not present

## 2020-03-04 DIAGNOSIS — I10 Essential (primary) hypertension: Secondary | ICD-10-CM | POA: Diagnosis not present

## 2020-03-04 DIAGNOSIS — E782 Mixed hyperlipidemia: Secondary | ICD-10-CM

## 2020-03-04 NOTE — Telephone Encounter (Signed)
  Patient Consent for Virtual Visit         Garrett Welch has provided verbal consent on 03/04/2020 for a virtual visit (video or telephone).   CONSENT FOR VIRTUAL VISIT FOR:  Garrett Welch  By participating in this virtual visit I agree to the following:  I hereby voluntarily request, consent and authorize [facility] and its employed or contracted physicians, physician assistants, nurse practitioners or other licensed health care professionals (the Practitioner), to provide me with telemedicine health care services (the "Services") as deemed necessary by the treating Practitioner. I acknowledge and consent to receive the Services by the Practitioner via telemedicine. I understand that the telemedicine visit will involve communicating with the Practitioner through live audiovisual communication technology and the disclosure of certain medical information by electronic transmission. I acknowledge that I have been given the opportunity to request an in-person assessment or other available alternative prior to the telemedicine visit and am voluntarily participating in the telemedicine visit.  I understand that I have the right to withhold or withdraw my consent to the use of telemedicine in the course of my care at any time, without affecting my right to future care or treatment, and that the Practitioner or I may terminate the telemedicine visit at any time. I understand that I have the right to inspect all information obtained and/or recorded in the course of the telemedicine visit and may receive copies of available information for a reasonable fee.  I understand that some of the potential risks of receiving the Services via telemedicine include:  Marland Kitchen Delay or interruption in medical evaluation due to technological equipment failure or disruption; . Information transmitted may not be sufficient (e.g. poor resolution of images) to allow for appropriate medical decision  making by the Practitioner; and/or  . In rare instances, security protocols could fail, causing a breach of personal health information.  Furthermore, I acknowledge that it is my responsibility to provide information about my medical history, conditions and care that is complete and accurate to the best of my ability. I acknowledge that Practitioner's advice, recommendations, and/or decision may be based on factors not within their control, such as incomplete or inaccurate data provided by me or distortions of diagnostic images or specimens that may result from electronic transmissions. I understand that the practice of medicine is not an exact science and that Practitioner makes no warranties or guarantees regarding treatment outcomes. I acknowledge that a copy of this consent can be made available to me via my patient portal ([facility]), or I can request a printed copy by calling the office of [facility].    I understand that my insurance will be billed for this visit.   I have read or had this consent read to me. . I understand the contents of this consent, which adequately explains the benefits and risks of the Services being provided via telemedicine.  . I have been provided ample opportunity to ask questions regarding this consent and the Services and have had my questions answered to my satisfaction. . I give my informed consent for the services to be provided through the use of telemedicine in my medical care

## 2020-03-04 NOTE — Progress Notes (Signed)
Virtual Visit via Telephone Note   This visit type was conducted due to national recommendations for restrictions regarding the COVID-19 Pandemic (e.g. social distancing) in an effort to limit this patient's exposure and mitigate transmission in our community.  Due to his co-morbid illnesses, this patient is at least at moderate risk for complications without adequate follow up.  This format is felt to be most appropriate for this patient at this time.  The patient did not have access to video technology/had technical difficulties with video requiring transitioning to audio format only (telephone).  All issues noted in this document were discussed and addressed.  No physical exam could be performed with this format.  Please refer to the patient's chart for his  consent to telehealth for [facility]    Date:  03/04/2020   ID:  Garrett Welch, DOB 29-Mar-1951, MRN [id number] The patient was identified using 2 identifiers.  Patient Location: Home Provider Location: Home Office  PCP:  Garrett Hoist, MD  Cardiologist:  Garrett Mocha, MD  Electrophysiologist:  Garrett Peru, MD   Evaluation Performed:  Follow-Up Visit  Chief Complaint:  12 months follow up  History of Present Illness:    Garrett Welch is a 69 y.o. male with hx of coronary artery disease, atrial flutter s/p ablation 10/2017, tachycardia induced cardiomyopathy,diabetes,hypertension,hyperlipidemia, prostate CA, obesity s/p prior gastric bypass seen for follow up.   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 He developed reduced LVF with an EF of 30% in the setting of atrial flutter with RVR. His EF returned to normal at 50-55% after RFCA restoring normal sinus rhythm. He was seen by Dr. Lovena Welch (last 10/2017)>> maintaining sinus rhythm. Stopped anticoagulation.   Cardiac Catheterization10/2019  demonstrated mild to mod distal LM disease  (40-50%), CTO of the LAD with R-L collaterals, subtotal occlusion of the D1 and LCx and mod stenosis of the RI. There were no PCI options and he was referred for CABG. Subsequently underwent CABG x 2 with Dr. Cyndia Bent (L-LAD, free RIMA to Dx) 02/2018. Post bypass course was fairly uneventful aside from bradycardia requiring reduction in his beta-blockerdose.  He underwent emergency cholecystectomy for acute cholecystitis 05/2018.   He was doing well when last seen by me 12/2018.  Patient is seen virtually for follow-up.  No complaints.  He is doing recumbent bicycle riding and weight lifting without chest pain or shortness of breath.  Denies orthopnea, PND, syncope, lower extremity edema or melena.  He has been vaccinated for COVID-19 and plan to get booster shot next week.   The patient does not have symptoms concerning for COVID-19 infection (fever, chills, cough, or new shortness of breath).    Past Medical History:  Diagnosis Date  . Atrial flutter ([place]) 09/16/2017   S/p RFCA // EF was 30% in setting of AFl w/ RVR // EF improved in NSR (Echo 8/19: Mod conc LVH, EF 50-55, Gr 1 DD, mild dilated ascending aorta (40 mm), MAC, trivial TR)  . CAD (coronary artery disease) 04/06/2018   LHC 10/19: dLM 40-50, LAD 100 CTO, D1 99, LCx 99, RI 60; R-L collats, EF normal // s/p CABG 11/19 (L-LAD, RIMA-Dx)  . Cancer ([place])   . Diabetes mellitus without complication ([place])   . HTN (hypertension)   . Hyperlipidemia LDL goal <70   . OA (osteoarthritis)    right knee  . PAC (premature atrial contraction)   . Pneumonia  walking pneumonia--yrs. ago  . Prostate cancer ([place])   . Sleep apnea    Past Surgical History:  Procedure Laterality Date  . A-FLUTTER ABLATION N/A 10/18/2017   Procedure: A-FLUTTER ABLATION;  Surgeon: Evans Lance, MD;  Location: [place] [facility];  Service: Cardiovascular;  Laterality: N/A;  . CARDIAC CATHETERIZATION  09/2014   [facility] in [place], LAD 100%, anomalous CFX 40-50%, RCA  patent  . CORONARY ARTERY BYPASS GRAFT N/A 03/20/2018   Procedure: CORONARY ARTERY BYPASS GRAFTING (CABG)  times two using bilateral internal mammary arteries. Plating of sternal fracture.;  Surgeon: [name], MD;  Location: [facility];  Service: Open Heart Surgery;  Laterality: N/A;  with bilateral IMA  . GASTRIC BYPASS     20 yrs. ago  . LEFT HEART CATH AND CORONARY ANGIOGRAPHY N/A 02/22/2018   Procedure: LEFT HEART CATH AND CORONARY ANGIOGRAPHY;  Surgeon: Garrett Mocha, MD;  Location: [place] [facility];  Service: Cardiovascular;  Laterality: N/A;  . prostate radioactive seeds implant 2009    . RADIAL ARTERY HARVEST N/A 03/20/2018   Procedure: possible RADIAL ARTERY HARVEST;  Surgeon: [name], MD;  Location: [place];  Service: Open Heart Surgery;  Laterality: N/A;  . TEE WITHOUT CARDIOVERSION N/A 03/20/2018   Procedure: TRANSESOPHAGEAL ECHOCARDIOGRAM (TEE);  Surgeon: [name], MD;  Location: [place];  Service: Open Heart Surgery;  Laterality: N/A;     Current Meds  Medication Sig  . albuterol (PROVENTIL HFA;VENTOLIN HFA) 108 (90 Base) MCG/ACT inhaler Inhale 2 puffs into the lungs every 6 (six) hours as needed for wheezing or shortness of breath.  Marland Kitchen aspirin EC 81 MG tablet Take 81 mg by mouth daily.  . ferrous sulfate 325 (65 FE) MG EC tablet Take 1 tablet (325 mg total) by mouth daily with breakfast.  . fluticasone (FLONASE) 50 MCG/ACT nasal spray Place 2 sprays into both nostrils daily as needed for allergies.   Marland Kitchen irbesartan (AVAPRO) 75 MG tablet Take 1 tablet (75 mg total) by mouth daily. Please make overdue appt with Dr. Burt Knack before anymore refills. Thank you 2nd attempt  . metFORMIN (GLUCOPHAGE) 500 MG tablet Take 1,000 mg by mouth 2 (two) times daily.   . metoprolol tartrate (LOPRESSOR) 25 MG tablet Take 0.5 tablets (12.5 mg total) by mouth 2 (two) times daily.  . Multiple Vitamins-Minerals (MULTIVITAMIN WITH MINERALS) tablet Take 1 tablet by mouth daily.  Vladimir Faster  Glycol-Propyl Glycol (LUBRICANT EYE DROPS) 0.4-0.3 % SOLN Place 1 drop into both eyes 3 (three) times daily as needed (for dry/irritated eyes.).  Marland Kitchen pramipexole (MIRAPEX) 0.125 MG tablet Take 0.125 mg by mouth at bedtime.  . rosuvastatin (CRESTOR) 40 MG tablet Take 1 tablet (40 mg total) by mouth daily.  . sodium fluoride (PREVIDENT 5000 PLUS) 1.1 % CREA dental cream Take 1 application by mouth 2 (two) times daily.   . tamsulosin (FLOMAX) 0.4 MG CAPS capsule Take 0.4 mg by mouth at bedtime.      Allergies:   Tape, Tramadol, and Amoxicillin-pot clavulanate   Social History   Tobacco Use  . Smoking status: Never Smoker  . Smokeless tobacco: Never Used  Vaping Use  . Vaping Use: Never used  Substance Use Topics  . Alcohol use: Yes    Comment: occ  . Drug use: Never     Family Hx: The patient's family history includes Cancer in his father; Heart attack in his mother; Heart disease in his mother; Heart failure in his mother; Hypertension in  his maternal grandmother and mother; Kidney failure in his father; Stroke in his father.  ROS:   Please see the history of present illness.   All other systems reviewed and are negative.   Prior CV studies:   The following studies were reviewed today:  LEFT HEART CATH AND CORONARY ANGIOGRAPHY 01/2018  Conclusion    Prox RCA to Mid RCA lesion is 25% stenosed.  Post Atrio lesion is 50% stenosed.  Mid LM to Dist LM lesion is 50% stenosed.  Ost Ramus to Ramus lesion is 60% stenosed.  Ost Cx to Prox Cx lesion is 99% stenosed.  Ost LAD to Prox LAD lesion is 95% stenosed.  Prox LAD lesion is 100% stenosed.  Ost 1st Diag lesion is 99% stenosed.  The left ventricular ejection fraction is 50-55% by visual estimate.  1. Mild to moderate distal left main stenosis estimated at 40 to 50% 2. Chronic total occlusion of the proximal LAD with right to left collaterals supplying the mid and distal LAD territory 3. Subtotal occlusion of the  first diagonal branch in the left circumflex 4. Moderate stenosis of the large ramus intermedius branch estimated at 60% 5. Patent RCA with mild nonobstructive stenosis (large dominant vessel) 6. Mild segmental contraction abnormality the left ventricle with preserved overall LV systolic function  Recommend: The patient does not have typical symptoms of angina. However he has moderate ischemia on stress testing and based on assessment of his coronary anatomy as at large burden of potential ischemia involving the LAD, diagonal branch, circumflex, and intermediate branches. I do not think he has any feasible PCI options. I am going to refer him for an outpatient cardiac surgical evaluation for consideration of CABG versus ongoing medical therapy.    Echo 11/2017 Study Conclusions  - Left ventricle: The cavity size was normal. There was moderate concentric hypertrophy. Systolic function was normal. The estimated ejection fraction was in the range of 50% to 55%. Doppler parameters are consistent with abnormal left ventricular relaxation (grade 1 diastolic dysfunction). Doppler parameters are consistent with elevated ventricular end-diastolic filling pressure. - Aortic valve: There was no regurgitation. - Ascending aorta: The ascending aorta was mildly dilated measuring 40 mm. - Mitral valve: Calcified annulus. Mildly thickened leaflets . - Right ventricle: The cavity size was normal. Wall thickness was normal. Systolic pressure was within the normal range. - Tricuspid valve: There was trivial regurgitation. - Pulmonary arteries: Systolic pressure was within the normal range. - Inferior vena cava: The vessel was normal in size. The respirophasic diameter changes were in the normal range (>= 50%), consistent with normal central venous pressure.  Impressions:  - Since the last study on 09/28/2017 LVEF has improved from 30-35% to 50-55%.   Labs/Other  Tests and Data Reviewed:    EKG:  No ECG reviewed.  Recent Labs: No results found for requested labs within last 8760 hours.   Recent Lipid Panel Lab Results  Component Value Date/Time   CHOL 141 06/05/2015 08:55 AM   TRIG 65 06/05/2015 08:55 AM   HDL 47 06/05/2015 08:55 AM   CHOLHDL 3.0 06/05/2015 08:55 AM   LDLCALC 81 06/05/2015 08:55 AM    Wt Readings from Last 3 Encounters:  03/04/20 (!) 341 lb (154.7 kg)  01/23/19 (!) 331 lb 12.8 oz (150.5 kg)  07/26/18 279 lb (126.6 kg)    Objective:    Vital Signs:  BP 133/78   Ht _0  (1.905 m)   Wt (!) 341 lb (154.7 kg)  BMI 42.62 kg/m    VITAL SIGNS:  reviewed GEN:  no acute distress EYES:  sclerae anicteric, EOMI - Extraocular Movements Intact RESPIRATORY:  normal respiratory effort, symmetric expansion CARDIOVASCULAR:  no peripheral edema SKIN:  no rash, lesions or ulcers. MUSCULOSKELETAL:  no obvious deformities. NEURO:  alert and oriented x 3, no obvious focal deficit PSYCH:  normal affect  ASSESSMENT & PLAN:    1. CAD s/p CABG - No angina. Continue ASA, statin and BB.   2. Paroxysmal atrial flutter s/p Ablation 09/2017 -  He was seen by Dr. Lovena Welch (last 10/2017)>> maintaining sinus rhythm. Stopped anticoagulation -Denies palpitation.  3. HTN - BP stable.  4. HLD - LDL 62 08/2018. Continue statin. Managed by PCP.   5.  Surgical clearance -Patient is easily getting greater than 4 METS of activity without anginal symptoms.  He is planning for left foot bunionectomy January 2021.  He is clear for surgery at acceptable risk.   COVID-19 Education: The signs and symptoms of COVID-19 were discussed with the patient and how to seek care for testing (follow up with PCP or arrange E-visit).  The importance of social distancing was discussed today.  Time:   Today, I have spent 10 minutes with the patient with telehealth technology discussing the above problems.     Medication Adjustments/Labs and Tests  Ordered: Current medicines are reviewed at length with the patient today.  Concerns regarding medicines are outlined above.   Tests Ordered: No orders of the defined types were placed in this encounter.   Medication Changes: No orders of the defined types were placed in this encounter.   Follow Up:  Virtual Visit  in 1 year(s)  Signed, Leanor Kail, PA  03/04/2020 10:42 AM    [place]

## 2020-03-04 NOTE — Patient Instructions (Signed)
Medication Instructions:  Your physician recommends that you continue on your current medications as directed. Please refer to the Current Medication list given to you today.  *If you need a refill on your cardiac medications before your next appointment, please call your pharmacy*   Lab Work: None If you have labs (blood work) drawn today and your tests are completely normal, you will receive your results only by: Marland Kitchen MyChart Message (if you have MyChart) OR . A paper copy in the mail If you have any lab test that is abnormal or we need to change your treatment, we will call you to review the results.   Follow-Up: At [facility], you and your health needs are our priority.  As part of our continuing mission to provide you with exceptional heart care, we have created designated Provider Care Teams.  These Care Teams include your primary Cardiologist (physician) and Advanced Practice Providers (APPs -  Physician Assistants and Nurse Practitioners) who all work together to provide you with the care you need, when you need it.  We recommend signing up for the patient portal called "MyChart".  Sign up information is provided on this After Visit Summary.  MyChart is used to connect with patients for Virtual Visits (Telemedicine).  Patients are able to view lab/test results, encounter notes, upcoming appointments, etc.  Non-urgent messages can be sent to your provider as well.   To learn more about what you can do with MyChart, go to [email or web address].    Your next appointment:   1 year(s)  The format for your next appointment:   In Person  Provider:   You may see Sherren Mocha, MD or one of the following Advanced Practice Providers on your designated Care Team:    Richardson Dopp, PA-C  [place], [place]

## 2020-04-30 ENCOUNTER — Other Ambulatory Visit: Payer: Self-pay | Admitting: Cardiovascular Disease

## 2020-09-24 IMAGING — DX DG CHEST 1V PORT
1 series · 1 of 1 positions shown · non-contrast
Comparison: 03/20/2018

CLINICAL DATA: Follow-up chest tube

EXAM:
PORTABLE CHEST 1 VIEW

[chest]
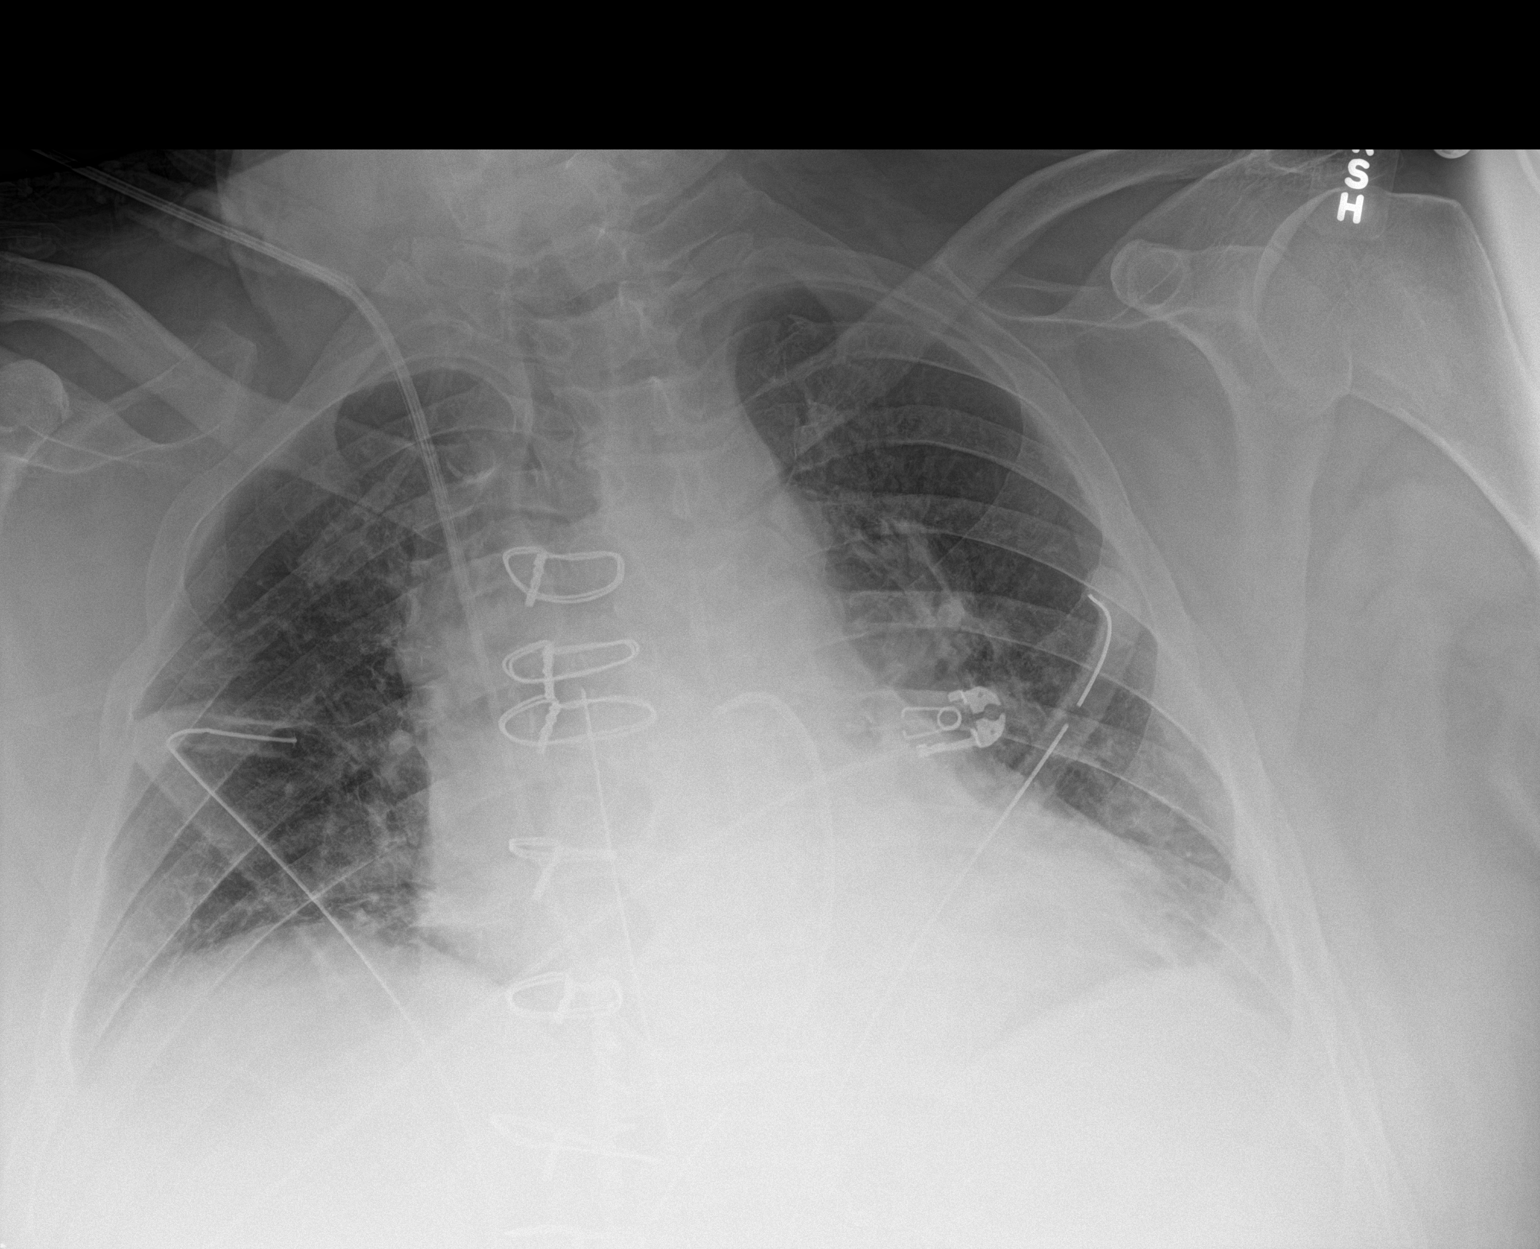

[1 of 1 positions shown; findings below may reference images not displayed]

FINDINGS: Cardiac shadow is enlarged in size but accentuated by the portable
technique. Mediastinal drain and bilateral thoracostomy catheters
are noted. No pneumothorax is seen. Swan-Ganz catheter is noted in
the pulmonary outflow tract. The endotracheal tube has been removed
in the interval. The overall inspiratory effort is poor with mild
bibasilar atelectasis.
IMPRESSION: Tubes and lines as described above.

Mild bibasilar atelectasis and poor inspiratory effort.

## 2020-09-25 IMAGING — DX DG CHEST 1V PORT
1 series · 1 of 1 positions shown · non-contrast
Comparison: 03/22/2015

CLINICAL DATA: Chest pain following coronary bypass grafting

EXAM:
PORTABLE CHEST 1 VIEW

[chest]
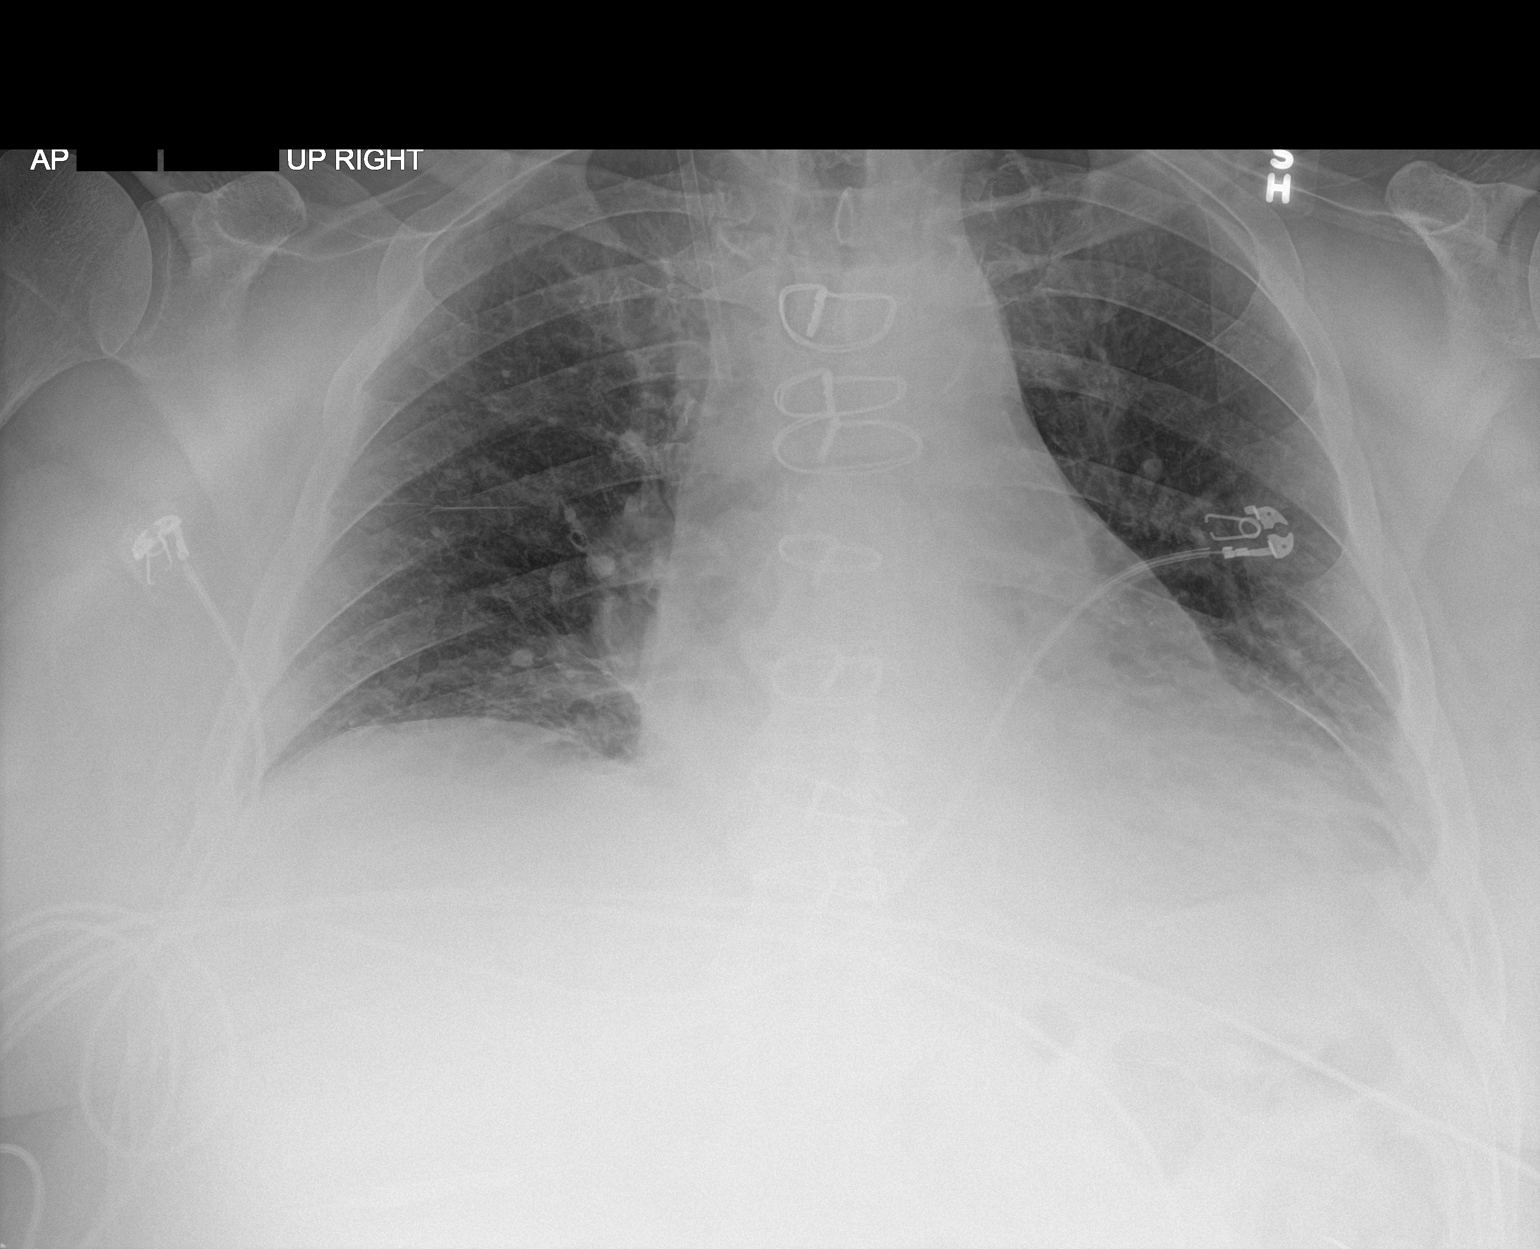

[1 of 1 positions shown; findings below may reference images not displayed]

FINDINGS: Cardiac shadow is stable. Postsurgical changes are again seen.
Mediastinal drain and bilateral thoracostomy catheters have been
removed in the interval. Swan-Ganz catheter has also been removed.
Mild bibasilar atelectasis is again identified and stable. No new
focal abnormality is noted.
IMPRESSION: Interval removal of tubes and lines as described.

Stable bibasilar atelectasis.

## 2021-02-23 ENCOUNTER — Other Ambulatory Visit: Payer: Self-pay | Admitting: Cardiovascular Disease

## 2021-05-14 ENCOUNTER — Other Ambulatory Visit: Payer: Self-pay | Admitting: Cardiovascular Disease

## 2021-05-18 NOTE — Progress Notes (Signed)
Cardiology Office Note:    Date:  05/19/2021   ID:  Garrett Welch, DOB 27-Oct-1950, MRN [id number]  PCP:  Tempie Hoist, MD   [facility] Providers Cardiologist:  Sherren Mocha, MD Electrophysiologist:  [name], MD     Referring MD: Tempie Hoist, MD   Chief Complaint: 1 year f/u CAD, HTN  History of Present Illness:    Garrett Welch is a very pleasant 71 y.o. 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 In the past these had a patent right coronary artery without significant stenosis.  LV function was normal until he developed atrial flutter with RVR and echo revealed LVEF around 30%.  He ultimately underwent flutter ablation with restoration of sinus rhythm and improvement of LVEF to 50 to 55%.  In October 2019 he underwent nuclear stress test which demonstrated apical thinning and mild to moderate anterior apical ischemia consistent with the patient's LAD occlusion.  Dr. Burt Knack recommended cardiac catheterization to evaluate progressive CAD and further assess revascularization options.  Cardiac cath demonstrated mild to moderate distal left main disease (40-50%), CTO of the LAD with right to left collaterals, subtotal occlusion of the D1 and left circumflex and moderate stenosis of the RI.  There were no PCI options and he was referred for CABG. he underwent CABG x2 (LIMA to the LAD, free RIMA to Dx) by Dr. Cyndia Bent during admission 11/25-11/29/19.  Post bypass course was fairly uneventful aside from bradycardia requiring reduction in his beta-blocker dose and volume overload requiring diuretic therapy.  Since that time he has been seen in regular follow-up. He underwent emergency cholecystectomy for acute cholecystitis 2/20. He was last seen via video  visit by Robbie Lis, PA on 03/04/20. He was doing well at that visit and was cleared for upcoming bunionectomy.   Today, he is here alone for annual follow-up.  He reports he has been feeling well and denies chest pain, dyspnea, dizziness, weakness, presyncope, PND, orthopnea, early satiety, melena, hematuria, hemoptysis, diaphoresis. He reports occasional palpitations that are not bothersome to him. States they resolved following CABG in 2019 then returned at an unknown time. He has bilateral lower extremity edema that he feels is stable.  Previously took hydrochlorothiazide but it was stopped, he is unsure why. He is planning to have bilateral knee replacement starting the the right knee in May 2023. He is riding his recumbent bike 3 times a week without dyspnea or chest pain.    Past Medical History:  Diagnosis Date   Atrial flutter ([place]) 09/16/2017   S/p RFCA // EF was 30% in setting of AFl w/ RVR // EF improved in NSR (Echo 8/19: Mod conc LVH, EF 50-55, Gr 1 DD, mild dilated ascending aorta (40 mm), MAC, trivial TR)   CAD (coronary artery disease) 04/06/2018   LHC 10/19: dLM 40-50, LAD 100 CTO, D1 99, LCx 99, RI 60; R-L collats, EF normal // s/p CABG 11/19 (L-LAD, RIMA-Dx)   Cancer (HCC)    Diabetes mellitus without complication (HCC)    HTN (hypertension)    Hyperlipidemia LDL goal <70    OA (osteoarthritis)    right knee   PAC (premature atrial contraction)    Pneumonia    walking pneumonia--yrs. ago   Prostate cancer ([place])  Sleep apnea     Past Surgical History:  Procedure Laterality Date   A-FLUTTER ABLATION N/A 10/18/2017   Procedure: A-FLUTTER ABLATION;  Surgeon: Evans Lance, MD;  Location: [place] [facility];  Service: Cardiovascular;  Laterality: N/A;   CARDIAC CATHETERIZATION  09/2014   [facility] in [place], LAD 100%, anomalous CFX 40-50%, RCA patent   CORONARY ARTERY BYPASS GRAFT N/A 03/20/2018   Procedure: CORONARY ARTERY BYPASS GRAFTING (CABG)  times two using bilateral  internal mammary arteries. Plating of sternal fracture.;  Surgeon: [name], MD;  Location: [facility];  Service: Open Heart Surgery;  Laterality: N/A;  with bilateral IMA   GASTRIC BYPASS     20 yrs. ago   LEFT HEART CATH AND CORONARY ANGIOGRAPHY N/A 02/22/2018   Procedure: LEFT HEART CATH AND CORONARY ANGIOGRAPHY;  Surgeon: Sherren Mocha, MD;  Location: [place] [facility];  Service: Cardiovascular;  Laterality: N/A;   prostate radioactive seeds implant 2009     RADIAL ARTERY HARVEST N/A 03/20/2018   Procedure: possible RADIAL ARTERY HARVEST;  Surgeon: [name], MD;  Location: [place];  Service: Open Heart Surgery;  Laterality: N/A;   TEE WITHOUT CARDIOVERSION N/A 03/20/2018   Procedure: TRANSESOPHAGEAL ECHOCARDIOGRAM (TEE);  Surgeon: [name], MD;  Location: [place];  Service: Open Heart Surgery;  Laterality: N/A;    Current Medications: Current Meds  Medication Sig   albuterol (PROVENTIL HFA;VENTOLIN HFA) 108 (90 Base) MCG/ACT inhaler Inhale 2 puffs into the lungs every 6 (six) hours as needed for wheezing or shortness of breath.   aspirin EC 81 MG tablet Take 81 mg by mouth daily.   famotidine (PEPCID) 40 MG tablet SMARTSIG:1 Tablet(s) By Mouth Every Evening   ferrous sulfate 325 (65 FE) MG EC tablet Take 1 tablet (325 mg total) by mouth daily with breakfast.   fluticasone (FLONASE) 50 MCG/ACT nasal spray Place 2 sprays into both nostrils daily as needed for allergies.    irbesartan (AVAPRO) 75 MG tablet Take 1 tablet (75 mg total) by mouth daily.   metFORMIN (GLUCOPHAGE) 500 MG tablet Take 1,000 mg by mouth 2 (two) times daily.    metoprolol tartrate (LOPRESSOR) 25 MG tablet Take 0.5 tablets (12.5 mg total) by mouth 2 (two) times daily.   Multiple Vitamins-Minerals (MULTIVITAMIN WITH MINERALS) tablet Take 1 tablet by mouth daily.   Polyethyl Glycol-Propyl Glycol 0.4-0.3 % SOLN Place 1 drop into both eyes 3 (three) times daily as needed (for dry/irritated eyes.).    pramipexole (MIRAPEX) 0.125 MG tablet Take 0.125 mg by mouth at bedtime.   rosuvastatin (CRESTOR) 40 MG tablet Take 1 tablet (40 mg total) by mouth daily.   sodium fluoride (PREVIDENT 5000 PLUS) 1.1 % CREA dental cream Take 1 application by mouth 2 (two) times daily.    tamsulosin (FLOMAX) 0.4 MG CAPS capsule Take 0.4 mg by mouth at bedtime.      Allergies:   Tape, Tramadol, and Amoxicillin-pot clavulanate   Social History   Socioeconomic History   Marital status: Married    Spouse name: Not on file   Number of children: Not on file   Years of education: Not on file   Highest education level: Not on file  Occupational History   Occupation: Pastor  Tobacco Use   Smoking status: Never   Smokeless tobacco: Never  Vaping Use   Vaping Use: Never used  Substance and Sexual Activity   Alcohol use: Yes    Comment: occ   Drug use:  Never   Sexual activity: Not on file  Other Topics Concern   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    Social Determinants of Health   Financial Resource Strain: Not on file  Food Insecurity: Not on file  Transportation Needs: Not on file  Physical Activity: Not on file  Stress: Not on file  Social Connections: Not on file     Family History: The patient's family history includes Cancer in his father; Heart attack in his mother; Heart disease in his mother; Heart failure in his mother; Hypertension in his maternal grandmother and mother; Kidney failure in his father; Stroke in his father.  ROS:   Please see the history of present illness.    +bilateral lower extremity edema All other systems reviewed and are negative.  Labs/Other Studies Reviewed:    The following studies were reviewed today:  LHC 10/19  Prox RCA to Mid RCA lesion is 25% stenosed. Post Atrio lesion is 50% stenosed. Mid LM to Dist LM lesion is 50% stenosed. Ost Ramus to Ramus lesion is 60% stenosed. Ost Cx to Prox Cx lesion is  99% stenosed. Ost LAD to Prox LAD lesion is 95% stenosed. Prox LAD lesion is 100% stenosed. Ost 1st Diag lesion is 99% stenosed. The left ventricular ejection fraction is 50-55% by visual estimate.   1.  Mild to moderate distal left main stenosis estimated at 40 to 50% 2.  Chronic total occlusion of the proximal LAD with right to left collaterals supplying the mid and distal LAD territory 3.  Subtotal occlusion of the first diagonal branch in the left circumflex 4.  Moderate stenosis of the large ramus intermedius branch estimated at 60% 5.  Patent RCA with mild nonobstructive stenosis (large dominant vessel) 6.  Mild segmental contraction abnormality the left ventricle with preserved overall LV systolic function   Recommend: The patient does not have typical symptoms of angina.  However he has moderate ischemia on stress testing and based on assessment of his coronary anatomy as at large burden of potential ischemia involving the LAD, diagonal branch, circumflex, and intermediate branches.  I do not think he has any feasible PCI options.  I am going to refer him for an outpatient cardiac surgical evaluation for consideration of CABG versus ongoing medical therapy.  Echo 8/19  Left ventricle:  The cavity size was normal. There was moderate  concentric hypertrophy. Systolic function was normal. The estimated  ejection fraction was in the range of 50% to 55%. Doppler  parameters are consistent with abnormal left ventricular relaxation  (grade 1 diastolic dysfunction). Doppler parameters are consistent  with elevated ventricular end-diastolic filling pressure.  Aortic valve:   Trileaflet; moderately thickened, moderately  calcified leaflets. Sclerosis without stenosis.  Doppler:  There  was no regurgitation.  Aorta: Ascending aorta: The ascending aorta was mildly dilated  measuring 40 mm.  Mitral valve:   Calcified annulus. Mildly thickened leaflets .  Doppler:     Peak gradient (D): 4 mm  Hg.  Right ventricle:  The cavity size was normal. Wall thickness was  normal. Systolic function was normal. Systolic pressure was within  the normal range.  Pulmonic valve:    Structurally normal valve.   Cusp separation was  normal.  Doppler:  Transvalvular velocity was within the normal  range. There was no regurgitation.  Tricuspid valve:   Structurally normal valve.   Leaflet separation  was normal.  Doppler:  Transvalvular  velocity was within the normal  range. There was trivial regurgitation.  Pulmonary artery:   Systolic pressure was within the normal range.  Systemic veins:  Inferior vena cava: The vessel was normal in size. The  respirophasic diameter changes were in the normal range (>= 50%),  consistent with normal central venous pressure.    Recent Labs: 07/01/20 hgb 14.2, plt 220 01/06/21 A1C 6.3   Recent Lipid Panel    Component Value Date/Time   CHOL 141 06/05/2015 0855   TRIG 65 06/05/2015 0855   HDL 47 06/05/2015 0855   CHOLHDL 3.0 06/05/2015 0855   VLDL 13 06/05/2015 0855   LDLCALC 81 06/05/2015 0855     Risk Assessment/Calculations:      Physical Exam:    VS:  BP (!) 148/82 (BP Location: Right Arm)    Pulse (!) 54    Ht _0  (1.905 m)    Wt (!) 346 lb 9.6 oz (157.2 kg)    SpO2 98%    BMI 43.32 kg/m     Wt Readings from Last 3 Encounters:  05/19/21 (!) 346 lb 9.6 oz (157.2 kg)  03/04/20 (!) 341 lb (154.7 kg)  01/23/19 (!) 331 lb 12.8 oz (150.5 kg)     GEN:  Well nourished, well developed in no acute distress HEENT: Normal NECK: No JVD; No carotid bruits CARDIAC: RRR, no murmurs, rubs, gallops RESPIRATORY:  Clear to auscultation without rales, wheezing or rhonchi  ABDOMEN: Obese, soft, non-tender, non-distended MUSCULOSKELETAL:  Bilateral pitting edema; No deformity. 2+ pedal pulses, equal bilaterally SKIN: Warm and dry NEUROLOGIC:  Alert and oriented x 3 PSYCHIATRIC:  Normal affect   EKG:  EKG is ordered today.  The ekg ordered today  demonstrates sinus rhythm with aberrant conduction versus new RBBB, LAD, no ST/T wave abnormality  Diagnoses:    1. PAC (premature atrial contraction)   2. Palpitations   3. Chronic diastolic heart failure ([place])   4. Essential hypertension   5. Coronary artery disease involving native coronary artery of native heart without angina pectoris   6. S/P CABG x 2   7. Abnormal EKG   8. Hyperlipidemia LDL goal <70    Assessment and Plan:     PAC/Abnormal EKG/Palpitations: EKG today reveals possible aberrant conduction versus RBBB, a change since his last ekg 9/20. He has occasional palpitations which initially resolved after CABG in 2019 but returned recently (does not recall specific date).  We will get echocardiogram for evaluation of LVEF and valve function in the setting of EKG changes, palpitations. We will place a 3-day ZIO monitor for additional rhythm analysis.  Heart rate is 54 bpm, will continue low-dose metoprolol and await further testing.  CAD without angina/s/p CABG: He denies chest pain, dyspnea, or additional symptom concerning for angina.  He is doing his recumbent bike several times a week without difficulty.  He can easily achieve > 4 METS activity without difficulty. No bleeding problems on aspirin. Continue aspirin, metoprolol, statin.   Chronic diastolic HF: LVEF 12-24%, G1DD 8/19. He has chronic leg edema that he feels is stable. Otherwise appears euvolemic.  He does not limit sodium intake.  We will get updated echocardiogram today for further evaluation.  Will await results of basic metabolic panel for decision regarding diuretic therapy.  Would favor addition of Lasix if kidney function and electrolytes are stable.  Continue irbesartan.   Essential hypertension: Blood pressure is elevated today.  He reports recent home readings are consistent with today.  We  do not have recent kidney function or electrolytes so we will get basic metabolic panel today.  Would favor the  addition of furosemide for leg edema and blood pressure control if kidney function/electrolytes are stable.  Additionally could increase dose of irbesartan as he is not on max dose. Continue irbesartan, metoprolol.   Hyperlipidemia LDL goal < 70: Last LDL 81 2017. Will update today.  He reports compliance with rosuvastatin 40 mg daily. Continue statin.   Disposition: 3 months with Dr. Burt Knack or APP   Medication Adjustments/Labs and Tests Ordered: Current medicines are reviewed at length with the patient today.  Concerns regarding medicines are outlined above.  Orders Placed This Encounter  Procedures   Comp Met (CMET)   Lipid Profile   LONG TERM MONITOR (3-14 DAYS)   EKG 12-Lead   ECHOCARDIOGRAM COMPLETE   No orders of the defined types were placed in this encounter.   Patient Instructions  Medication Instructions:   Your physician recommends that you continue on your current medications as directed. Please refer to the Current Medication list given to you today.  *If you need a refill on your cardiac medications before your next appointment, please call your pharmacy*   Lab Work:  TODAY!!!!  CMET/LIPID  If you have labs (blood work) drawn today and your tests are completely normal, you will receive your results only by: [place] (if you have MyChart) OR A paper copy in the mail If you have any lab test that is abnormal or we need to change your treatment, we will call you to review the results.   Testing/Procedures:  A zio monitor was ordered today. It will remain on for 3 days. You will then return monitor and event diary in provided box. It takes 1-2 weeks for report to be downloaded and returned to [place]. We will call you with the results. If monitor falls off or has orange flashing light, please call Zio for further instructions.   Your physician has requested that you have an echocardiogram. Echocardiography is a painless test that uses sound waves to create  images of your heart. It provides your doctor with information about the size and shape of your heart and how well your hearts chambers and valves are working. This procedure takes approximately one hour. There are no restrictions for this procedure.    Follow-Up: At [facility], you and your health needs are our priority.  As part of our continuing mission to provide you with exceptional heart care, we have created designated Provider Care Teams.  These Care Teams include your primary Cardiologist (physician) and Advanced Practice Providers (APPs -  Physician Assistants and Nurse Practitioners) who all work together to provide you with the care you need, when you need it.  We recommend signing up for the patient portal called "MyChart".  Sign up information is provided on this After Visit Summary.  MyChart is used to connect with patients for Virtual Visits (Telemedicine).  Patients are able to view lab/test results, encounter notes, upcoming appointments, etc.  Non-urgent messages can be sent to your provider as well.   To learn more about what you can do with MyChart, go to [email or web address].    Your next appointment:   3 month(s)  The format for your next appointment:   In Person  Provider:   Christen Bame, NP         Other Instructions     Signed, Emmaline Life, NP  05/19/2021 1:11 PM  [facility]

## 2021-05-19 ENCOUNTER — Ambulatory Visit (INDEPENDENT_AMBULATORY_CARE_PROVIDER_SITE_OTHER): Payer: Medicare Other | Admitting: Nurse Practitioner

## 2021-05-19 ENCOUNTER — Ambulatory Visit (INDEPENDENT_AMBULATORY_CARE_PROVIDER_SITE_OTHER): Payer: Medicare Other

## 2021-05-19 ENCOUNTER — Other Ambulatory Visit: Payer: Self-pay

## 2021-05-19 ENCOUNTER — Encounter: Payer: Self-pay | Admitting: Nurse Practitioner

## 2021-05-19 VITALS — BP 148/82 | HR 54 | Ht 75.0 in | Wt 346.6 lb

## 2021-05-19 DIAGNOSIS — I10 Essential (primary) hypertension: Secondary | ICD-10-CM

## 2021-05-19 DIAGNOSIS — E785 Hyperlipidemia, unspecified: Secondary | ICD-10-CM

## 2021-05-19 DIAGNOSIS — I5032 Chronic diastolic (congestive) heart failure: Secondary | ICD-10-CM

## 2021-05-19 DIAGNOSIS — Z951 Presence of aortocoronary bypass graft: Secondary | ICD-10-CM

## 2021-05-19 DIAGNOSIS — I491 Atrial premature depolarization: Secondary | ICD-10-CM | POA: Diagnosis not present

## 2021-05-19 DIAGNOSIS — R002 Palpitations: Secondary | ICD-10-CM

## 2021-05-19 DIAGNOSIS — R9431 Abnormal electrocardiogram [ECG] [EKG]: Secondary | ICD-10-CM

## 2021-05-19 DIAGNOSIS — I2510 Atherosclerotic heart disease of native coronary artery without angina pectoris: Secondary | ICD-10-CM

## 2021-05-19 LAB — COMPREHENSIVE METABOLIC PANEL
ALT: 24 IU/L (ref 0–44)
AST: 28 IU/L (ref 0–40)
Albumin/Globulin Ratio: 2.4 — ABNORMAL HIGH (ref 1.2–2.2)
Albumin: 4.4 g/dL (ref 3.8–4.8)
Alkaline Phosphatase: 53 IU/L (ref 44–121)
BUN/Creatinine Ratio: 19 (ref 10–24)
BUN: 15 mg/dL (ref 8–27)
Bilirubin Total: 0.5 mg/dL (ref 0.0–1.2)
CO2: 26 mmol/L (ref 20–29)
Calcium: 9.2 mg/dL (ref 8.6–10.2)
Chloride: 104 mmol/L (ref 96–106)
Creatinine, Ser: 0.81 mg/dL (ref 0.76–1.27)
Globulin, Total: 1.8 g/dL (ref 1.5–4.5)
Glucose: 123 mg/dL — ABNORMAL HIGH (ref 70–99)
Potassium: 4.8 mmol/L (ref 3.5–5.2)
Sodium: 141 mmol/L (ref 134–144)
Total Protein: 6.2 g/dL (ref 6.0–8.5)
eGFR: 96 mL/min/{1.73_m2} (ref >59)

## 2021-05-19 LAB — LIPID PANEL
Chol/HDL Ratio: 2.1 ratio (ref 0.0–5.0)
Cholesterol, Total: 115 mg/dL (ref 100–199)
HDL: 56 mg/dL (ref >39)
LDL Chol Calc (NIH): 46 mg/dL (ref 0–99)
Triglycerides: 56 mg/dL (ref 0–149)
VLDL Cholesterol Cal: 13 mg/dL (ref 5–40)

## 2021-05-19 NOTE — Progress Notes (Unsigned)
[id number] 3 day zio xt from office inventory applied to patient.Marland Kitchen

## 2021-05-19 NOTE — Patient Instructions (Signed)
Medication Instructions:   Your physician recommends that you continue on your current medications as directed. Please refer to the Current Medication list given to you today.  *If you need a refill on your cardiac medications before your next appointment, please call your pharmacy*   Lab Work:  TODAY!!!!  CMET/LIPID  If you have labs (blood work) drawn today and your tests are completely normal, you will receive your results only by: [place] (if you have MyChart) OR A paper copy in the mail If you have any lab test that is abnormal or we need to change your treatment, we will call you to review the results.   Testing/Procedures:  A zio monitor was ordered today. It will remain on for 3 days. You will then return monitor and event diary in provided box. It takes 1-2 weeks for report to be downloaded and returned to [place]. We will call you with the results. If monitor falls off or has orange flashing light, please call Zio for further instructions.   Your physician has requested that you have an echocardiogram. Echocardiography is a painless test that uses sound waves to create images of your heart. It provides your doctor with information about the size and shape of your heart and how well your hearts chambers and valves are working. This procedure takes approximately one hour. There are no restrictions for this procedure.    Follow-Up: At [facility], you and your health needs are our priority.  As part of our continuing mission to provide you with exceptional heart care, we have created designated Provider Care Teams.  These Care Teams include your primary Cardiologist (physician) and Advanced Practice Providers (APPs -  Physician Assistants and Nurse Practitioners) who all work together to provide you with the care you need, when you need it.  We recommend signing up for the patient portal called "MyChart".  Sign up information is provided on this After Visit Summary.  MyChart  is used to connect with patients for Virtual Visits (Telemedicine).  Patients are able to view lab/test results, encounter notes, upcoming appointments, etc.  Non-urgent messages can be sent to your provider as well.   To learn more about what you can do with MyChart, go to [email or web address].    Your next appointment:   3 month(s)  The format for your next appointment:   In Person  Provider:   Christen Bame, NP         Other Instructions

## 2021-05-22 ENCOUNTER — Other Ambulatory Visit: Payer: Self-pay | Admitting: *Deleted

## 2021-05-22 DIAGNOSIS — I10 Essential (primary) hypertension: Secondary | ICD-10-CM

## 2021-05-22 DIAGNOSIS — I491 Atrial premature depolarization: Secondary | ICD-10-CM

## 2021-05-22 DIAGNOSIS — R002 Palpitations: Secondary | ICD-10-CM

## 2021-05-22 DIAGNOSIS — I5032 Chronic diastolic (congestive) heart failure: Secondary | ICD-10-CM

## 2021-05-22 MED ORDER — POTASSIUM CHLORIDE ER 10 MEQ PO TBCR: 10.0000 meq | EXTENDED_RELEASE_TABLET | Freq: Every day | ORAL | 3 refills | Status: DC

## 2021-05-22 MED ORDER — FUROSEMIDE 40 MG PO TABS: 40.0000 mg | ORAL_TABLET | Freq: Every day | ORAL | 3 refills | Status: DC

## 2021-05-27 ENCOUNTER — Other Ambulatory Visit: Payer: Medicare Other

## 2021-05-27 ENCOUNTER — Ambulatory Visit (HOSPITAL_COMMUNITY): Payer: Medicare Other | Attending: Cardiology

## 2021-05-27 ENCOUNTER — Other Ambulatory Visit: Payer: Self-pay

## 2021-05-27 ENCOUNTER — Other Ambulatory Visit: Payer: Self-pay | Admitting: *Deleted

## 2021-05-27 DIAGNOSIS — I2510 Atherosclerotic heart disease of native coronary artery without angina pectoris: Secondary | ICD-10-CM

## 2021-05-27 DIAGNOSIS — I5032 Chronic diastolic (congestive) heart failure: Secondary | ICD-10-CM | POA: Diagnosis present

## 2021-05-27 DIAGNOSIS — R002 Palpitations: Secondary | ICD-10-CM

## 2021-05-27 DIAGNOSIS — I491 Atrial premature depolarization: Secondary | ICD-10-CM | POA: Diagnosis present

## 2021-05-27 LAB — ECHOCARDIOGRAM COMPLETE
AV Mean grad: 12 mmHg
AV Peak grad: 21.4 mmHg
Ao pk vel: 2.31 m/s
Area-P 1/2: 1.69 cm2
S' Lateral: 3.9 cm

## 2021-06-01 ENCOUNTER — Encounter: Payer: Self-pay | Admitting: Cardiovascular Disease

## 2021-06-29 ENCOUNTER — Ambulatory Visit
Admission: RE | Admit: 2021-06-29 | Discharge: 2021-06-29 | Disposition: A | Discharge status: Home / Self Care | Payer: Medicare Other | Source: Ambulatory Visit | Attending: Nurse Practitioner | Admitting: Nurse Practitioner

## 2021-06-29 ENCOUNTER — Other Ambulatory Visit: Payer: Self-pay

## 2021-06-29 DIAGNOSIS — I2510 Atherosclerotic heart disease of native coronary artery without angina pectoris: Secondary | ICD-10-CM

## 2021-06-29 MED ORDER — IOPAMIDOL (ISOVUE-370) INJECTION 76%
75.0000 mL | Freq: Once | INTRAVENOUS | Status: AC | PRN
  Administered 2021-06-29: 75 mL via INTRAVENOUS

## 2021-08-01 NOTE — Progress Notes (Signed)
?Cardiology Office Note:   ? ?Date:  08/10/2021  ? ?ID:  Garrett Welch, DOB [birth year], MRN [id number] ? ?PCP:  Tempie Hoist, MD ?  ?[facility] Providers ?Cardiologist:  Sherren Mocha, MD ?Electrophysiologist:  [name], MD    ? ?Referring MD: Tempie Hoist, MD  ? ?Chief Complaint: 3 month follow-up leg edema, frequent PACs ? ?History of Present Illness:   ? ?Garrett Welch is a very pleasant 71 y.o. male with a hx of CAD s/p CABG x 2 2019, HTN, hyperlipidemia, DM, atrial flutter s/p ablation 2019, sleep apnea on OSA, obesity, bypass surgery, and OA, ? 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 In the past these had a patent right coronary artery without significant stenosis.  LV function was normal until he developed atrial flutter with RVR and echo revealed LVEF around 30%.  He ultimately underwent flutter ablation with restoration of sinus rhythm and improvement of LVEF to 50 to 55%.  In October 2019 he underwent nuclear stress test which demonstrated apical thinning and mild to moderate anterior apical ischemia consistent with the patient's LAD occlusion.  Dr. Burt Knack recommended cardiac catheterization to evaluate progressive CAD and further assess revascularization options.  Cardiac cath demonstrated mild to moderate distal left main disease (40-50%), CTO of the LAD with right to left collaterals, subtotal occlusion of the D1 and left circumflex and moderate stenosis of the RI.  There were no PCI options and he was referred for CABG. he underwent CABG x2 (LIMA to the LAD, free RIMA to Dx) by Dr. Cyndia Bent during admission 11/25-11/29/19.  Post bypass course was fairly uneventful aside from bradycardia requiring reduction in his beta-blocker dose and volume overload requiring diuretic therapy.  Since that time he has been seen in regular follow-up. He underwent emergency cholecystectomy for acute cholecystitis 2/20. He was  last seen via video visit by Robbie Lis, PA on 03/04/20. He was doing well at that visit and was cleared for upcoming bunionectomy.  ? ?He was last seen in our office by me on 05/19/21 for annual follow-up.  He reported occasional palpitations that are not bothersome to him. States they resolved following CABG in 2019 then returned at an unknown time. He has bilateral lower extremity edema that he feels is stable.  Previously took hydrochlorothiazide but it was stopped, he is unsure why. He is planning to have bilateral knee replacement starting the the right knee in May 2023. He is riding his recumbent bike 3 times a week without dyspnea or chest pain. EKG revealed possible aberrant conduction versus RBBB, a change from previous EKG 12/2018. Cardiac monitor revealed frequent PACs without sustained arrhythmia. With underlying bradycardia, we could not up-titrate beta blocker. Plan to continue medical therapy.  ? ?Today, he is here alone for follow-up of frequent PACs, leg edema.  He reports leg edema has improved drastically since starting Lasix.  Home blood pressure readings are most often less than 130/80, occasional SBP in 130s.  He is working diligently on weight loss for upcoming total knee replacement, states he has goal weight of 250 pounds. He denies chest pain, shortness of breath, fatigue, palpitations, melena, hematuria, hemoptysis, diaphoresis, weakness, presyncope, syncope, orthopnea, and PND.  He continues to ride recumbent bike for exercise without symptoms. Is increasing intake of vegetables and lean protein and has reduced intake of refined sugar and simple carbohydrates.  He has no specific complaints today. ? ? ?Past Medical History:  ?  Diagnosis Date  ? Atrial flutter ([place]) 09/16/2017  ? S/p RFCA // EF was 30% in setting of AFl w/ RVR // EF improved in NSR (Echo 8/19: Mod conc LVH, EF 50-55, Gr 1 DD, mild dilated ascending aorta (40 mm), MAC, trivial TR)  ? CAD (coronary artery disease) 04/06/2018  ?  LHC 10/19: dLM 40-50, LAD 100 CTO, D1 99, LCx 99, RI 60; R-L collats, EF normal // s/p CABG 11/19 (L-LAD, RIMA-Dx)  ? Cancer [facility])   ? Diabetes mellitus without complication ([place])   ? HTN (hypertension)   ? Hyperlipidemia LDL goal <70   ? OA (osteoarthritis)   ? right knee  ? PAC (premature atrial contraction)   ? Pneumonia   ? walking pneumonia--yrs. ago  ? Prostate cancer ([place])   ? Sleep apnea   ? ? ?Past Surgical History:  ?Procedure Laterality Date  ? A-FLUTTER ABLATION N/A 10/18/2017  ? Procedure: A-FLUTTER ABLATION;  Surgeon: Evans Lance, MD;  Location: [place] [facility];  Service: Cardiovascular;  Laterality: N/A;  ? CARDIAC CATHETERIZATION  09/2014  ? 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 CORONARY ARTERY BYPASS GRAFT N/A 03/20/2018  ? Procedure: CORONARY ARTERY BYPASS GRAFTING (CABG)  times two using bilateral internal mammary arteries. Plating of sternal fracture.;  Surgeon: [name], MD;  Location: [facility] OR;  Service: Open Heart Surgery;  Laterality: N/A;  with bilateral IMA  ? GASTRIC BYPASS    ? 20 yrs. ago  ? LEFT HEART CATH AND CORONARY ANGIOGRAPHY N/A 02/22/2018  ? Procedure: LEFT HEART CATH AND CORONARY ANGIOGRAPHY;  Surgeon: Sherren Mocha, MD;  Location: [place] [facility];  Service: Cardiovascular;  Laterality: N/A;  ? prostate radioactive seeds implant 2009    ? RADIAL ARTERY HARVEST N/A 03/20/2018  ? Procedure: possible RADIAL ARTERY HARVEST;  Surgeon: [name], MD;  Location: [place];  Service: Open Heart Surgery;  Laterality: N/A;  ? TEE WITHOUT CARDIOVERSION N/A 03/20/2018  ? Procedure: TRANSESOPHAGEAL ECHOCARDIOGRAM (TEE);  Surgeon: [name], MD;  Location: [place];  Service: Open Heart Surgery;  Laterality: N/A;  ? ? ?Current Medications: ?Current Meds  ?Medication Sig  ? albuterol (PROVENTIL HFA;VENTOLIN HFA) 108 (90 Base) MCG/ACT inhaler Inhale 2 puffs into the lungs every 6 (six) hours as needed for wheezing or shortness of breath.  ? aspirin EC  81 MG tablet Take 81 mg by mouth daily.  ? famotidine (PEPCID) 40 MG tablet Take 40 mg by mouth every evening.  ? ferrous sulfate 325 (65 FE) MG EC tablet Take 1 tablet (325 mg total) by mouth daily with breakfast.  ? fluticasone (FLONASE) 50 MCG/ACT nasal spray Place 2 sprays into both nostrils daily as needed for allergies.   ? furosemide (LASIX) 40 MG tablet Take 1 tablet (40 mg total) by mouth daily.  ? irbesartan (AVAPRO) 75 MG tablet Take 1 tablet (75 mg total) by mouth daily.  ? metFORMIN (GLUCOPHAGE) 500 MG tablet Take 1,000 mg by mouth 2 (two) times daily.   ? Multiple Vitamins-Minerals (MULTIVITAMIN WITH MINERALS) tablet Take 1 tablet by mouth daily.  ? Polyethyl Glycol-Propyl Glycol 0.4-0.3 % SOLN Place 1 drop into both eyes 3 (three) times daily as needed (for dry/irritated eyes.).  ? potassium chloride (KLOR-CON) 10 MEQ tablet Take 1 tablet (10 mEq total) by mouth daily.  ? pramipexole (MIRAPEX) 0.125 MG tablet Take 0.125 mg by mouth at bedtime.  ? rosuvastatin (CRESTOR) 40 MG tablet Take 1  tablet (40 mg total) by mouth daily.  ? sodium fluoride (PREVIDENT 5000 PLUS) 1.1 % CREA dental cream Take 1 application by mouth 2 (two) times daily.   ? tamsulosin (FLOMAX) 0.4 MG CAPS capsule Take 0.4 mg by mouth at bedtime.   ? triamcinolone cream (KENALOG) 0.1 % Apply 1 application. topically daily.  ? [DISCONTINUED] metoprolol tartrate (LOPRESSOR) 25 MG tablet Take 0.5 tablets (12.5 mg total) by mouth 2 (two) times daily.  ? [DISCONTINUED] rosuvastatin (CRESTOR) 20 MG tablet Take 20 mg by mouth daily.  ?  ? ?Allergies:   Tape, Tramadol, Amoxicillin, and Amoxicillin-pot clavulanate  ? ?Social History  ? ?Socioeconomic History  ? Marital status: Married  ?  Spouse name: Not on file  ? Number of children: Not on file  ? Years of education: Not on file  ? Highest education level: Not on file  ?Occupational History  ? Occupation: Theme park manager  ?Tobacco Use  ? Smoking status: Never  ? Smokeless tobacco: Never  ?Vaping Use   ? Vaping Use: Never used  ?Substance and Sexual Activity  ? Alcohol use: Yes  ?  Comment: occ  ? Drug use: Never  ? Sexual activity: Not on file  ?Other Topics Concern  ? Not on file  ?Social History

## 2021-08-04 NOTE — H&P (Addendum)
TOTAL KNEE ADMISSION H&P ? ?Patient is being admitted for right total knee arthroplasty. ? ?Subjective: ? ?Chief Complaint: Right knee pain. ? ?HPI: Garrett Welch, 71 y.o. male has a history of pain and functional disability in the right knee due to arthritis and has failed non-surgical conservative treatments for greater than 12 weeks to include use of assistive devices, weight reduction as appropriate, and activity modification. Onset of symptoms was  gradual , starting  several  years ago with gradually worsening course since that time. The patient noted no past surgery on the right knee.  Patient currently rates pain in the right knee at 8 out of 10 with activity. Patient has night pain, worsening of pain with activity and weight bearing, pain that interferes with activities of daily living, and crepitus. Patient has evidence of  severe bone-on-bone arthritis in the medial and patellofemoral compartments with varus deformity and tibial subluxation  by imaging studies. There is no active infection. ? ?Patient Active Problem List  ? Diagnosis Date Noted  ? CAD (coronary artery disease) 04/06/2018  ? S/P CABG x 2 03/20/2018  ? Abnormal nuclear cardiac imaging test 02/22/2018  ? Atrial flutter ([place]) 09/16/2017  ? Hypotension 09/16/2017  ? HTN (hypertension)   ? 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 ? ?Past Medical History:  ?Diagnosis Date  ? Atrial flutter ([place]) 09/16/2017  ? S/p RFCA // EF was 30% in setting of AFl w/ RVR // EF improved in NSR (Echo 8/19: Mod conc LVH, EF 50-55, Gr 1 DD, mild dilated ascending aorta (40 mm), MAC, trivial TR)  ? CAD (coronary artery disease) 04/06/2018  ? LHC 10/19: dLM 40-50, LAD 100 CTO, D1 99, LCx 99, RI 60; R-L collats, EF normal // s/p CABG 11/19 (L-LAD, RIMA-Dx)  ? Cancer [facility])   ? Diabetes mellitus without complication ([place])   ? HTN (hypertension)   ? Hyperlipidemia LDL goal <70   ? OA (osteoarthritis)   ? right knee  ? PAC (premature atrial contraction)   ? Pneumonia    ? walking pneumonia--yrs. ago  ? Prostate cancer ([place])   ? Sleep apnea   ? ? ?Past Surgical History:  ?Procedure Laterality Date  ? A-FLUTTER ABLATION N/A 10/18/2017  ? Procedure: A-FLUTTER ABLATION;  Surgeon: Evans Lance, MD;  Location: [place] [facility];  Service: Cardiovascular;  Laterality: N/A;  ? CARDIAC CATHETERIZATION  09/2014  ? 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 CORONARY ARTERY BYPASS GRAFT N/A 03/20/2018  ? Procedure: CORONARY ARTERY BYPASS GRAFTING (CABG)  times two using bilateral internal mammary arteries. Plating of sternal fracture.;  Surgeon: [name], MD;  Location: [facility] OR;  Service: Open Heart Surgery;  Laterality: N/A;  with bilateral IMA  ? GASTRIC BYPASS    ? 20 yrs. ago  ? LEFT HEART CATH AND CORONARY ANGIOGRAPHY N/A 02/22/2018  ? Procedure: LEFT HEART CATH AND CORONARY ANGIOGRAPHY;  Surgeon: Sherren Mocha, MD;  Location: [place] [facility];  Service: Cardiovascular;  Laterality: N/A;  ? prostate radioactive seeds implant 2009    ? RADIAL ARTERY HARVEST N/A 03/20/2018  ? Procedure: possible RADIAL ARTERY HARVEST;  Surgeon: [name], MD;  Location: [place];  Service: Open Heart Surgery;  Laterality: N/A;  ? TEE WITHOUT CARDIOVERSION N/A 03/20/2018  ? Procedure: TRANSESOPHAGEAL ECHOCARDIOGRAM (TEE);  Surgeon: [name], MD;  Location: [place];  Service: Open Heart Surgery;  Laterality: N/A;  ? ? ?Prior to Admission medications   ?  Medication Sig Start Date End Date Taking? Authorizing Provider  ?albuterol (PROVENTIL HFA;VENTOLIN HFA) 108 (90 Base) MCG/ACT inhaler Inhale 2 puffs into the lungs every 6 (six) hours as needed for wheezing or shortness of breath.    [provider]  ?aspirin EC 81 MG tablet Take 81 mg by mouth daily.    [provider]  ?famotidine (PEPCID) 40 MG tablet SMARTSIG:1 Tablet(s) By Mouth Every Evening 04/04/21   [provider]  ?ferrous sulfate 325 (65 FE) MG EC tablet Take 1 tablet (325 mg total)  by mouth daily with breakfast. 03/24/18   Nani Skillern, PA-C  ?fluticasone (FLONASE) 50 MCG/ACT nasal spray Place 2 sprays into both nostrils daily as needed for allergies.     [provider]  ?furosemide (LASIX) 40 MG tablet Take 1 tablet (40 mg total) by mouth daily. 05/22/21 08/20/21  Swinyer, Lanice Schwab, NP  ?irbesartan (AVAPRO) 75 MG tablet Take 1 tablet (75 mg total) by mouth daily. 05/15/21   Sherren Mocha, MD  ?metFORMIN (GLUCOPHAGE) 500 MG tablet Take 1,000 mg by mouth 2 (two) times daily.  09/16/14   [provider]  ?metoprolol tartrate (LOPRESSOR) 25 MG tablet Take 0.5 tablets (12.5 mg total) by mouth 2 (two) times daily. 01/23/19 05/19/21  Leanor Kail, PA  ?Multiple Vitamins-Minerals (MULTIVITAMIN WITH MINERALS) tablet Take 1 tablet by mouth daily.    [provider]  ?Polyethyl Glycol-Propyl Glycol 0.4-0.3 % SOLN Place 1 drop into both eyes 3 (three) times daily as needed (for dry/irritated eyes.).    [provider]  ?potassium chloride (KLOR-CON) 10 MEQ tablet Take 1 tablet (10 mEq total) by mouth daily. 05/22/21 08/20/21  Swinyer, Lanice Schwab, NP  ?pramipexole (MIRAPEX) 0.125 MG tablet Take 0.125 mg by mouth at bedtime. 08/27/14   [provider]  ?rosuvastatin (CRESTOR) 40 MG tablet Take 1 tablet (40 mg total) by mouth daily. 05/15/21   Sherren Mocha, MD  ?sodium fluoride (PREVIDENT 5000 PLUS) 1.1 % CREA dental cream Take 1 application by mouth 2 (two) times daily.  07/01/14   [provider]  ?tamsulosin (FLOMAX) 0.4 MG CAPS capsule Take 0.4 mg by mouth at bedtime.  09/18/14   [provider]  ? ? ?Allergies  ?Allergen Reactions  ? Tape Rash  ?  With prolonged use--blisters  ? Tramadol Other (See Comments), Rash and Hives  ?  Skin irritation ?Skin irritation ?Other reaction(s): Other ?Other reaction(s): Other (See Comments) ?Skin irritation ?Skin irritation ?Skin irritation ?Skin irritation ?Skin irritation ?Skin  irritation ?Skin irritation ?Skin irritation ?Skin irritation ?  ? Amoxicillin-Pot Clavulanate Nausea Only  ? ? ?Social History  ? ?Socioeconomic History  ? Marital status: Married  ?  Spouse name: Not on file  ? Number of children: Not on file  ? Years of education: Not on file  ? Highest education level: Not on file  ?Occupational History  ? Occupation: Theme park manager  ?Tobacco Use  ? Smoking status: Never  ? Smokeless tobacco: Never  ?Vaping Use  ? Vaping Use: Never used  ?Substance and Sexual Activity  ? Alcohol use: Yes  ?  Comment: occ  ? Drug use: Never  ? Sexual activity: Not on file  ?Other Topics Concern  ? Not on file  ?Social History Narrative  ? Works as a Writer. Never smoker.   ? ?Social Determinants of Health  ? ?Financial Resource Strain: Not on file  ?Food Insecurity: Not on file  ?Transportation Needs: Not on file  ?Physical  Activity: Not on file  ?Stress: Not on file  ?Social Connections: Not on file  ?Intimate Partner Violence: Not on file  ? ? ?Tobacco Use: Low Risk   ? Smoking Tobacco Use: Never  ? Smokeless Tobacco Use: Never  ? Passive Exposure: Not on file  ? ?Social History  ? ?Substance and Sexual Activity  ?Alcohol Use Yes  ? Comment: occ  ? ? ?Family History  ?Problem Relation Age of Onset  ? Heart failure Mother   ? Heart disease Mother   ? Heart attack Mother   ? Hypertension Mother   ? Stroke Father   ? Kidney failure Father   ? Cancer Father   ? Hypertension Maternal Grandmother   ? ? ?Review of Systems  ?Constitutional:  Negative for chills and fever.  ?HENT:  Negative for congestion, sore throat and tinnitus.   ?Eyes:  Negative for double vision, photophobia and pain.  ?Respiratory:  Negative for cough, shortness of breath and wheezing.   ?Cardiovascular:  Negative for chest pain, palpitations and orthopnea.  ?Gastrointestinal:  Negative for heartburn, nausea and vomiting.  ?Genitourinary:  Negative for dysuria, frequency and urgency.  ?Musculoskeletal:  Positive for joint  pain.  ?Neurological:  Negative for dizziness, weakness and headaches.  ? ?Objective: ? ?Physical Exam: ?Well nourished and well developed.  ?General: Alert and oriented x3, cooperative and pleasant, no acute distress

## 2021-08-07 NOTE — Progress Notes (Addendum)
Anesthesia Review: ? ?PCP: DR Tempie Hoist with [facility]  ?LOV 07/14/21.  ?Clearance- 05/04/21 by Robie Ridge on chart  ?Cardiologist : DR Gerrit Halls  LOV 08/10/21  ?Clearannce by Lorenda Peck on 08/10/21.  ?Chest x-ray : ?Ct angio chest- 06/29/21  ?EKG :08/10/2021  ?Echo :05/27/21  ?Monitor- 06/10/21  ?PFT- 2019  ?Stress test:2019  ?Cardiac Cath :  2019  ?Activity level:  can do a flight of stiars without difficulty  ?Sleep Study/ CPAP : has cpap to bring mask and tubing  ?Fasting Blood Sugar :      / Checks Blood Sugar -- times a day:   ?Blood Thinner/ Instructions /Last Dose: ?ASA / Instructions/ Last Dose :   ?81 mg Aspirin  ?DM- type  2 ?Hgba1c- 07/10/21- 6.3  ?Checks glucose every 2-3 days per pt  ?

## 2021-08-10 ENCOUNTER — Encounter: Payer: Self-pay | Admitting: Nurse Practitioner

## 2021-08-10 ENCOUNTER — Telehealth: Payer: Self-pay | Admitting: *Deleted

## 2021-08-10 ENCOUNTER — Ambulatory Visit (INDEPENDENT_AMBULATORY_CARE_PROVIDER_SITE_OTHER): Payer: Medicare Other | Admitting: Nurse Practitioner

## 2021-08-10 VITALS — BP 132/80 | HR 74 | Ht 75.0 in | Wt 316.4 lb

## 2021-08-10 DIAGNOSIS — I2510 Atherosclerotic heart disease of native coronary artery without angina pectoris: Secondary | ICD-10-CM

## 2021-08-10 DIAGNOSIS — I5032 Chronic diastolic (congestive) heart failure: Secondary | ICD-10-CM

## 2021-08-10 DIAGNOSIS — I10 Essential (primary) hypertension: Secondary | ICD-10-CM

## 2021-08-10 DIAGNOSIS — Z951 Presence of aortocoronary bypass graft: Secondary | ICD-10-CM

## 2021-08-10 DIAGNOSIS — E785 Hyperlipidemia, unspecified: Secondary | ICD-10-CM

## 2021-08-10 DIAGNOSIS — Z01810 Encounter for preprocedural cardiovascular examination: Secondary | ICD-10-CM

## 2021-08-10 DIAGNOSIS — I491 Atrial premature depolarization: Secondary | ICD-10-CM

## 2021-08-10 LAB — BASIC METABOLIC PANEL
BUN/Creatinine Ratio: 19 (ref 10–24)
BUN: 19 mg/dL (ref 8–27)
CO2: 23 mmol/L (ref 20–29)
Calcium: 9.5 mg/dL (ref 8.6–10.2)
Chloride: 102 mmol/L (ref 96–106)
Creatinine, Ser: 1.01 mg/dL (ref 0.76–1.27)
Glucose: 132 mg/dL — ABNORMAL HIGH (ref 70–99)
Potassium: 4.7 mmol/L (ref 3.5–5.2)
Sodium: 141 mmol/L (ref 134–144)
eGFR: 81 mL/min/{1.73_m2} (ref >59)

## 2021-08-10 MED ORDER — METOPROLOL TARTRATE 25 MG PO TABS: 12.5000 mg | ORAL_TABLET | Freq: Two times a day (BID) | ORAL | 3 refills | Status: DC

## 2021-08-10 NOTE — Telephone Encounter (Signed)
? ?  Primary Cardiologist: Sherren Mocha, MD ? ?Chart reviewed as part of pre-operative protocol coverage. Given past medical history and time since last visit, based on ACC/AHA guidelines, Hall Birchard III would be at acceptable risk for the planned procedure without further cardiovascular testing.  ? ?**See separate preop evaluation dated 08/10/21. ? ?I will route this recommendation to the requesting party via Epic fax function and remove from pre-op pool. ? ?Please call with questions. ? ?Emmaline Life, NP-C ? ?  ?08/10/2021, 10:01 AM ?[facility] ?[address] ?Office [phone] Fax [phone] ? ?

## 2021-08-10 NOTE — Patient Instructions (Addendum)
Medication Instructions:  ?Your physician recommends that you continue on your current medications as directed. Please refer to the Current Medication list given to you today. ? ?*If you need a refill on your cardiac medications before your next appointment, please call your pharmacy* ? ? ?Lab Work: ?TODAY:  BMET ? ?If you have labs (blood work) drawn today and your tests are completely normal, you will receive your results only by: ?MyChart Message (if you have MyChart) OR ?A paper copy in the mail ?If you have any lab test that is abnormal or we need to change your treatment, we will call you to review the results. ? ? ?Testing/Procedures: ?None ordered ? ? ?Follow-Up: ?At [facility], you and your health needs are our priority.  As part of our continuing mission to provide you with exceptional heart care, we have created designated Provider Care Teams.  These Care Teams include your primary Cardiologist (physician) and Advanced Practice Providers (APPs -  Physician Assistants and Nurse Practitioners) who all work together to provide you with the care you need, when you need it. ? ?We recommend signing up for the patient portal called "MyChart".  Sign up information is provided on this After Visit Summary.  MyChart is used to connect with patients for Virtual Visits (Telemedicine).  Patients are able to view lab/test results, encounter notes, upcoming appointments, etc.  Non-urgent messages can be sent to your provider as well.   ?To learn more about what you can do with MyChart, go to [email or web address].   ? ?Your next appointment:   ?6 month(s) ? ?The format for your next appointment:   ?In Person ? ?Provider:   ?Sherren Mocha, MD  or Christen Bame, NP       ? ? ?Other Instructions ? ?Important Information About Sugar ? ? ? ? ?  ?

## 2021-08-10 NOTE — Progress Notes (Signed)
DUE TO COVID-19 ONLY  2 VISITOR IS ALLOWED TO COME WITH YOU AND STAY IN THE WAITING ROOM ONLY DURING PRE OP AND PROCEDURE DAY OF SURGERY.   4VISITOR  MAY VISIT WITH YOU AFTER SURGERY IN YOUR PRIVATE ROOM DURING VISITING HOURS ONLY! ?YOU MAY HAVE ONE PERSON SPEND THE NITE WITH YOU IN YOUR ROOM AFTER SURGERY.   ? ? ? Your procedure is scheduled on:  ?              08/24/2021  ? Report to [facility] Main  Entrance ? ? Report to admitting at  0515              AM ?DO NOT [place], PICTURE ID OR WALLET DAY OF SURGERY.  ?  ? ? Call this number if you have problems the morning of surgery [phone]  ? ? REMEMBER: NO  SOLID FOODS , CANDY, GUM OR MINTS AFTER MIDNITE THE NITE BEFORE SURGERY .       Marland Kitchen CLEAR LIQUIDS UNTIL    0415am             DAY OF SURGERY.      PLEASE FINISH ENSURE DRINK PER SURGEON ORDER  WHICH NEEDS TO BE COMPLETED AT     0415am       MORNING OF SURGERY.   ? ? ? ? ?CLEAR LIQUID DIET ? ? ?Foods Allowed      ?WATER ?BLACK COFFEE ( SUGAR OK, NO MILK, CREAM OR CREAMER) REGULAR AND DECAF  ?TEA ( SUGAR OK NO MILK, CREAM, OR CREAMER) REGULAR AND DECAF  ?PLAIN JELLO ( NO RED)  ?FRUIT ICES ( NO RED, NO FRUIT PULP)  ?POPSICLES ( NO RED)  ?JUICE- APPLE, WHITE GRAPE AND WHITE CRANBERRY  ?SPORT DRINK LIKE GATORADE ( NO RED)  ?CLEAR BROTH ( VEGETABLE , CHICKEN OR BEEF)                                                               ? ?    ? ?BRUSH YOUR TEETH MORNING OF SURGERY AND RINSE YOUR MOUTH OUT, NO CHEWING GUM CANDY OR MINTS. ?  ? ? Take these medicines the morning of surgery with A SIP OF WATER:  inhalers as usual andd bring, metoprolol  ? ? ?DO NOT TAKE ANY DIABETIC MEDICATIONS DAY OF YOUR SURGERY ?                  ?            You may not have any metal on your body including hair pins and  ?            piercings  Do not wear jewelry, make-up, lotions, powders or perfumes, deodorant ?            Do not wear nail polish on your fingernails.   ?           IF YOU ARE A MALE AND WANT TO  SHAVE UNDER ARMS OR LEGS PRIOR TO SURGERY YOU MUST DO SO AT LEAST 48 HOURS PRIOR TO SURGERY.  ?            Men may shave face and neck. ? ? Do not bring valuables to the hospital. [facility]  IS NOT ?            RESPONSIBLE   FOR VALUABLES. ? Contacts, dentures or bridgework may not be worn into surgery. ? Leave suitcase in the car. After surgery it may be brought to your room. ? ?  ? Patients discharged the day of surgery will not be allowed to drive home. IF YOU ARE HAVING SURGERY AND GOING HOME THE SAME DAY, YOU MUST HAVE AN ADULT TO DRIVE YOU HOME AND BE WITH YOU FOR 24 HOURS. YOU MAY GO HOME BY TAXI OR UBER OR ORTHERWISE, BUT AN ADULT MUST ACCOMPANY YOU HOME AND STAY WITH YOU FOR 24 HOURS. ?  ? ?            Please read over the following fact sheets you were given: ?_____________________________________________________________________ ? ?[facility] - Preparing for Surgery ?Before surgery, you can play an important role.  Because skin is not sterile, your skin needs to be as free of germs as possible.  You can reduce the number of germs on your skin by washing with CHG (chlorahexidine gluconate) soap before surgery.  CHG is an antiseptic cleaner which kills germs and bonds with the skin to continue killing germs even after washing. ?Please DO NOT use if you have an allergy to CHG or antibacterial soaps.  If your skin becomes reddened/irritated stop using the CHG and inform your nurse when you arrive at Short Stay. ?Do not shave (including legs and underarms) for at least 48 hours prior to the first CHG shower.  You may shave your face/neck. ?Please follow these instructions carefully: ? 1.  Shower with CHG Soap the night before surgery and the  morning of Surgery. ? 2.  If you choose to wash your hair, wash your hair first as usual with your  normal  shampoo. ? 3.  After you shampoo, rinse your hair and body thoroughly to remove the  shampoo.                           4.  Use CHG as you would any other liquid  soap.  You can apply chg directly  to the skin and wash  ?                     Gently with a scrungie or clean washcloth. ? 5.  Apply the CHG Soap to your body ONLY FROM THE NECK DOWN.   Do not use on face/ open      ?                     Wound or open sores. Avoid contact with eyes, ears mouth and genitals (private parts).  ?                     Production manager,  Genitals (private parts) with your normal soap. ?            6.  Wash thoroughly, paying special attention to the area where your surgery  will be performed. ? 7.  Thoroughly rinse your body with warm water from the neck down. ? 8.  DO NOT shower/wash with your normal soap after using and rinsing off  the CHG Soap. ?               9.  Pat yourself dry with a clean towel. ?  10.  Wear clean pajamas. ?           11.  Place clean sheets on your bed the night of your first shower and do not  sleep with pets. ?Day of Surgery : ?Do not apply any lotions/deodorants the morning of surgery.  Please wear clean clothes to the hospital/surgery center. ? ?FAILURE TO FOLLOW THESE INSTRUCTIONS MAY RESULT IN THE CANCELLATION OF YOUR SURGERY ?PATIENT SIGNATURE_________________________________ ? ?NURSE SIGNATURE__________________________________ ? ?________________________________________________________________________  ? ? ?           ?

## 2021-08-10 NOTE — Telephone Encounter (Signed)
? ?  Pre-operative Risk Assessment  ?  ?Patient Name: Garrett Welch  ?DOB: 02-26-1951 ?MRN: [id number]  ? ?  ? ?Request for Surgical Clearance   ? ?Procedure:   R TOTAL KNEE ARTHROPLASTY ? ?Date of Surgery:  Clearance 08/24/21                              ?   ?Surgeon:  DR. Wynelle Link ?Surgeon's Group or Practice Name:  [facility] ?Phone number:  [phone] ?Fax number:  [phone] ATTN:  [place] Chard ?  ?Type of Clearance Requested:   ?- Medical  NOT INDICATED ON CLEARANCE, BUT PT IS ON ASPIRIN  ?  ?Type of Anesthesia:  Spinal ?  ?Additional requests/questions:   ? ?Signed, ?Jeanann Lewandowsky   ?08/10/2021, 8:03 AM  ? ?

## 2021-08-10 NOTE — Telephone Encounter (Signed)
? ?  Primary Cardiologist: Sherren Mocha, MD ? ?Procedure:   R TOTAL KNEE ARTHROPLASTY ?  ?Date of Surgery:  Clearance 08/24/21                              ?   ?Surgeon:  DR. Wynelle Link ?Surgeon's Group or Practice Name:  [facility] ?Type of anesthesia: Spinal ? ?May patient hold aspirin for 5-7 days prior to surgery? ? ?

## 2021-08-11 ENCOUNTER — Other Ambulatory Visit: Payer: Self-pay

## 2021-08-11 ENCOUNTER — Encounter (HOSPITAL_COMMUNITY): Payer: Self-pay

## 2021-08-11 ENCOUNTER — Encounter (HOSPITAL_COMMUNITY)
Admission: RE | Admit: 2021-08-11 | Discharge: 2021-08-11 | Disposition: A | Discharge status: Home / Self Care | Payer: Medicare Other | Source: Ambulatory Visit | Attending: Orthopedic Surgery | Admitting: Orthopedic Surgery

## 2021-08-11 VITALS — BP 151/73 | HR 62 | Temp 98.5°F | Resp 16 | Ht 75.0 in | Wt 312.0 lb

## 2021-08-11 DIAGNOSIS — Z01818 Encounter for other preprocedural examination: Secondary | ICD-10-CM

## 2021-08-11 DIAGNOSIS — Z01812 Encounter for preprocedural laboratory examination: Secondary | ICD-10-CM | POA: Diagnosis present

## 2021-08-11 DIAGNOSIS — Z7984 Long term (current) use of oral hypoglycemic drugs: Secondary | ICD-10-CM | POA: Diagnosis not present

## 2021-08-11 DIAGNOSIS — Z951 Presence of aortocoronary bypass graft: Secondary | ICD-10-CM | POA: Diagnosis not present

## 2021-08-11 DIAGNOSIS — I2510 Atherosclerotic heart disease of native coronary artery without angina pectoris: Secondary | ICD-10-CM | POA: Diagnosis not present

## 2021-08-11 DIAGNOSIS — M1711 Unilateral primary osteoarthritis, right knee: Secondary | ICD-10-CM | POA: Insufficient documentation

## 2021-08-11 DIAGNOSIS — G4730 Sleep apnea, unspecified: Secondary | ICD-10-CM | POA: Diagnosis not present

## 2021-08-11 DIAGNOSIS — E119 Type 2 diabetes mellitus without complications: Secondary | ICD-10-CM | POA: Insufficient documentation

## 2021-08-11 DIAGNOSIS — I10 Essential (primary) hypertension: Secondary | ICD-10-CM | POA: Diagnosis not present

## 2021-08-11 HISTORY — DX: Anemia, unspecified: D64.9

## 2021-08-11 LAB — COMPREHENSIVE METABOLIC PANEL
ALT: 32 U/L (ref 0–44)
AST: 31 U/L (ref 15–41)
Albumin: 4.3 g/dL (ref 3.5–5.0)
Alkaline Phosphatase: 48 U/L (ref 38–126)
Anion gap: 9 (ref 5–15)
BUN: 24 mg/dL — ABNORMAL HIGH (ref 8–23)
CO2: 27 mmol/L (ref 22–32)
Calcium: 9.4 mg/dL (ref 8.9–10.3)
Chloride: 103 mmol/L (ref 98–111)
Creatinine, Ser: 1.13 mg/dL (ref 0.61–1.24)
GFR, Estimated: >60 mL/min (ref >60)
Glucose, Bld: 112 mg/dL — ABNORMAL HIGH (ref 70–99)
Potassium: 4.8 mmol/L (ref 3.5–5.1)
Sodium: 139 mmol/L (ref 135–145)
Total Bilirubin: 0.9 mg/dL (ref 0.3–1.2)
Total Protein: 6.9 g/dL (ref 6.5–8.1)

## 2021-08-11 LAB — CBC
HCT: 44.6 % (ref 39.0–52.0)
Hemoglobin: 14.9 g/dL (ref 13.0–17.0)
MCH: 32.3 pg (ref 26.0–34.0)
MCHC: 33.4 g/dL (ref 30.0–36.0)
MCV: 96.7 fL (ref 80.0–100.0)
Platelets: 283 10*3/uL (ref 150–400)
RBC: 4.61 MIL/uL (ref 4.22–5.81)
RDW: 12.5 % (ref 11.5–15.5)
WBC: 11.5 10*3/uL — ABNORMAL HIGH (ref 4.0–10.5)
nRBC: 0 % (ref 0.0–0.2)

## 2021-08-11 LAB — GLUCOSE, CAPILLARY: Glucose-Capillary: 101 mg/dL — ABNORMAL HIGH (ref 70–99)

## 2021-08-11 LAB — SURGICAL PCR SCREEN
MRSA, PCR: NEGATIVE
Staphylococcus aureus: NEGATIVE

## 2021-08-12 NOTE — Progress Notes (Signed)
Anesthesia Chart Review ? ? Case: [id number] Date/Time: 08/24/21 0700  ? Procedure: TOTAL KNEE ARTHROPLASTY (Right: Knee)  ? Anesthesia type: Choice  ? Pre-op diagnosis: right knee osteoarthritis  ? Location: [facility] ROOM 09 / [facility]  ? Surgeons: Gaynelle Arabian, MD  ? ?  ? ? ?DISCUSSION:70 y.o. never smoker with h/o DM II, HTN, sleep apnea with cpap, CAD s/p CABG x 2 2019, atrial flutter s/p ablation 2019, right knee OA scheduled for above procedure 08/24/21 with Dr. Gaynelle Arabian.  ? ?Pt last seen by cardiology 08/10/2021. Per OV note, "Preoperative cardiac evaluation: He is doing well from a cardiac perspective and may proceed to surgery without further cardiac testing.  According to the Revised Cardiac Risk Index (RCRI), his Perioperative Risk of Major Cardiac Event is (%): 6.6. His Functional Capacity in METs is: 7.59 according to the Duke Activity Status Index (DASI).  Per Dr. Burt Knack, primary cardiologist, he may hold aspirin for 5 to 7 days preoperatively and resume as soon as hemodynamically stable following surgery." ? ?Anticipate pt can proceed with planned procedure barring acute status change.   ?VS: BP (!) 151/73   Pulse 62   Temp 36.9 ?C (Oral)   Resp 16   Ht _0  (1.905 m)   Wt (!) 141.5 kg   SpO2 98%   BMI 39.00 kg/m?  ? ?PROVIDERS: ?Tempie Hoist, MD is PCP  ? ?Cardiologist : DR Gerrit Halls   ?LABS: Labs reviewed: Acceptable for surgery. ?(all labs ordered are listed, but only abnormal results are displayed) ? ?Labs Reviewed  ?CBC - Abnormal; Notable for the following components:  ?    Result Value  ? WBC 11.5 (*)   ? All other components within normal limits  ?COMPREHENSIVE METABOLIC PANEL - Abnormal; Notable for the following components:  ? Glucose, Bld 112 (*)   ? BUN 24 (*)   ? All other components within normal limits  ?GLUCOSE, CAPILLARY - Abnormal; Notable for the following components:  ? Glucose-Capillary 101 (*)   ? All other components within normal limits  ?SURGICAL PCR SCREEN   ? ? ? ?IMAGES: ? ? ?EKG: ?08/10/2021 ?Rate 71 bpm  ?LAD ?RBBB ?Frequent PACs ?No ST/T wave changes ? ?CV: ?Echo 05/27/21 ?1. Left ventricular ejection fraction, by estimation, is 60 to 65%. The  ?left ventricle has normal function. The left ventricle has no regional  ?wall motion abnormalities. There is severe concentric left ventricular  ?hypertrophy. Left ventricular diastolic  ? parameters are consistent with Grade I diastolic dysfunction (impaired  ?relaxation). Elevated left ventricular end-diastolic pressure.  ? 2. Right ventricular systolic function is normal. The right ventricular  ?size is normal. Tricuspid regurgitation signal is inadequate for assessing  ?PA pressure.  ? 3. Left atrial size was mildly dilated.  ? 4. The mitral valve is normal in structure. No evidence of mitral valve  ?regurgitation. No evidence of mitral stenosis.  ? 5. The aortic valve is calcified. Aortic valve regurgitation is not  ?visualized. Mild aortic valve stenosis. Aortic valve mean gradient  ?measures 12.0 mmHg. Aortic valve Vmax measures 2.31 m/s.  ? 6. Aortic dilatation noted. 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  ? 7. The inferior vena cava is normal in size with greater than 50%  ?respiratory variability, suggesting right atrial pressure of 3 mmHg.  ?Past Medical History:  ?Diagnosis Date  ? Anemia   ? Atrial flutter ([place]) 09/16/2017  ? S/p RFCA // EF was 30% in  setting of AFl w/ RVR // EF improved in NSR (Echo 8/19: Mod conc LVH, EF 50-55, Gr 1 DD, mild dilated ascending aorta (40 mm), MAC, trivial TR)  ? CAD (coronary artery disease) 04/06/2018  ? LHC 10/19: dLM 40-50, LAD 100 CTO, D1 99, LCx 99, RI 60; R-L collats, EF normal // s/p CABG 11/19 (L-LAD, RIMA-Dx)  ? Cancer [facility])   ? Diabetes mellitus without complication ([place])   ? HTN (hypertension)   ? Hyperlipidemia LDL goal <70   ? OA (osteoarthritis)   ? right knee  ? PAC (premature atrial contraction)   ? Prostate cancer ([place])   ? Sleep  apnea   ? cpap  ? ? ?Past Surgical History:  ?Procedure Laterality Date  ? A-FLUTTER ABLATION N/A 10/18/2017  ? Procedure: A-FLUTTER ABLATION;  Surgeon: Evans Lance, MD;  Location: [place] CV LAB;  Service: Cardiovascular;  Laterality: N/A;  ? CARDIAC CATHETERIZATION  09/2014  ? 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 CHOLECYSTECTOMY    ? CORONARY ARTERY BYPASS GRAFT N/A 03/20/2018  ? Procedure: CORONARY ARTERY BYPASS GRAFTING (CABG)  times two using bilateral internal mammary arteries. Plating of sternal fracture.;  Surgeon: [name], MD;  Location: [facility] OR;  Service: Open Heart Surgery;  Laterality: N/A;  with bilateral IMA  ? GASTRIC BYPASS    ? 20 yrs. ago  ? LEFT HEART CATH AND CORONARY ANGIOGRAPHY N/A 02/22/2018  ? Procedure: LEFT HEART CATH AND CORONARY ANGIOGRAPHY;  Surgeon: Sherren Mocha, MD;  Location: [place] CV LAB;  Service: Cardiovascular;  Laterality: N/A;  ? prostate radioactive seeds implant 2009    ? RADIAL ARTERY HARVEST N/A 03/20/2018  ? Procedure: possible RADIAL ARTERY HARVEST;  Surgeon: [name], MD;  Location: [place];  Service: Open Heart Surgery;  Laterality: N/A;  ? TEE WITHOUT CARDIOVERSION N/A 03/20/2018  ? Procedure: TRANSESOPHAGEAL ECHOCARDIOGRAM (TEE);  Surgeon: [name], MD;  Location: [place];  Service: Open Heart Surgery;  Laterality: N/A;  ? ? ?MEDICATIONS: ? albuterol (PROVENTIL HFA;VENTOLIN HFA) 108 (90 Base) MCG/ACT inhaler  ? aspirin EC 81 MG tablet  ? famotidine (PEPCID) 40 MG tablet  ? ferrous sulfate 325 (65 FE) MG EC tablet  ? fluticasone (FLONASE) 50 MCG/ACT nasal spray  ? furosemide (LASIX) 40 MG tablet  ? irbesartan (AVAPRO) 75 MG tablet  ? metFORMIN (GLUCOPHAGE) 500 MG tablet  ? metoprolol tartrate (LOPRESSOR) 25 MG tablet  ? Multiple Vitamins-Minerals (MULTIVITAMIN WITH MINERALS) tablet  ? Polyethyl Glycol-Propyl Glycol 0.4-0.3 % SOLN  ? potassium chloride (KLOR-CON) 10 MEQ tablet  ? pramipexole (MIRAPEX) 0.125 MG  tablet  ? rosuvastatin (CRESTOR) 40 MG tablet  ? sodium fluoride (PREVIDENT 5000 PLUS) 1.1 % CREA dental cream  ? tamsulosin (FLOMAX) 0.4 MG CAPS capsule  ? triamcinolone cream (KENALOG) 0.1 %  ? ?No current facility-administered medications for this encounter.  ? ? ? ?Konrad Felix Ward, PA-C ?[facility] ?[phone] ? ? ? ? ? ?

## 2021-08-12 NOTE — Anesthesia Preprocedure Evaluation (Addendum)
Anesthesia Evaluation  ?Patient identified by MRN, date of birth, ID band ?Patient awake ? ? ? ?Reviewed: ?Allergy & Precautions, NPO status , Patient's Chart, lab work & pertinent test results ? ?Airway ?Mallampati: III ? ?TM Distance: >3 FB ?Neck ROM: Full ? ? ? Dental ?no notable dental hx. ?(+) Dental Advisory Given ?  ?Pulmonary ?neg pulmonary ROS,  ?  ?Pulmonary exam normal ? ? ? ? ? ? ? Cardiovascular ?hypertension, Pt. on home beta blockers ?+ CAD and + CABG  ?Normal cardiovascular exam+ dysrhythmias Atrial Fibrillation  ? ?IMPRESSIONS  ? ? ??1. Left ventricular ejection fraction, by estimation, is 60 to 65%. The  ?left ventricle has normal function. The left ventricle has no regional  ?wall motion abnormalities. There is severe concentric left ventricular  ?hypertrophy. Left ventricular diastolic  ??parameters are consistent with Grade I diastolic dysfunction (impaired  ?relaxation). Elevated left ventricular end-diastolic pressure.  ??2. Right ventricular systolic function is normal. The right ventricular  ?size is normal. Tricuspid regurgitation signal is inadequate for assessing  ?PA pressure.  ??3. Left atrial size was mildly dilated.  ??4. The mitral valve is normal in structure. No evidence of mitral valve  ?regurgitation. No evidence of mitral stenosis.  ??5. The aortic valve is calcified. Aortic valve regurgitation is not  ?visualized. Mild aortic valve stenosis. Aortic valve mean gradient  ?measures 12.0 mmHg. Aortic valve Vmax measures 2.31 m/s.  ??6. Aortic dilatation noted. 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  ??7. The inferior vena cava is normal in size with greater than 50%  ?respiratory variability, suggesting right atrial pressure of 3 mmHg.  ?  ?Neuro/Psych ?negative neurological ROS ?   ? GI/Hepatic ?negative GI ROS, Neg liver ROS,   ?Endo/Other  ?diabetes ? Renal/GU ?negative Renal ROS  ? ?  ?Musculoskeletal ? ?(+)  Arthritis ,  ? Abdominal ?  ?Peds ? Hematology ?negative hematology ROS ?(+)   ?Anesthesia Other Findings ? ? Reproductive/Obstetrics ? ?  ? ? ? ? ? ? ? ? ? ? ? ? ? ?  ?  ? ? ? ? ? ? ?Anesthesia Physical ?Anesthesia Plan ? ?ASA: 3 ? ?Anesthesia Plan: Spinal and MAC  ? ?Post-op Pain Management: Celebrex PO (pre-op)*, Ofirmev IV (intra-op)* and Regional block*  ? ?Induction:  ? ?PONV Risk Score and Plan: 2 and Ondansetron and Propofol infusion ? ?Airway Management Planned: Natural Airway ? ?Additional Equipment:  ? ?Intra-op Plan:  ? ?Post-operative Plan:  ? ?Informed Consent: I have reviewed the patients History and Physical, chart, labs and discussed the procedure including the risks, benefits and alternatives for the proposed anesthesia with the patient or authorized representative who has indicated his/her understanding and acceptance.  ? ? ? ?Dental advisory given ? ?Plan Discussed with: Anesthesiologist and CRNA ? ?Anesthesia Plan Comments: (See PAT note 08/11/2021, Konrad Felix Ward, PA-C)  ? ? ? ? ?Anesthesia Quick Evaluation ? ?

## 2021-08-17 ENCOUNTER — Other Ambulatory Visit: Payer: Self-pay | Admitting: Cardiovascular Disease

## 2021-08-20 ENCOUNTER — Encounter (HOSPITAL_COMMUNITY): Payer: Medicare Other

## 2021-08-24 ENCOUNTER — Ambulatory Visit (HOSPITAL_BASED_OUTPATIENT_CLINIC_OR_DEPARTMENT_OTHER): Payer: Medicare Other | Admitting: Anesthesiology

## 2021-08-24 ENCOUNTER — Encounter (HOSPITAL_COMMUNITY)
Admission: RE | Disposition: A | Discharge status: Home / Self Care | Payer: Self-pay | Source: Ambulatory Visit | Attending: Orthopedic Surgery

## 2021-08-24 ENCOUNTER — Other Ambulatory Visit: Payer: Self-pay

## 2021-08-24 ENCOUNTER — Ambulatory Visit (HOSPITAL_COMMUNITY): Payer: Medicare Other | Admitting: Physician Assistant

## 2021-08-24 ENCOUNTER — Encounter (HOSPITAL_COMMUNITY): Payer: Self-pay | Admitting: Orthopedic Surgery

## 2021-08-24 ENCOUNTER — Observation Stay (HOSPITAL_COMMUNITY)
Admission: RE | Admit: 2021-08-24 | Discharge: 2021-08-25 | Disposition: A | Discharge status: Home / Self Care | Payer: Medicare Other | Source: Ambulatory Visit | Attending: Orthopedic Surgery | Admitting: Orthopedic Surgery

## 2021-08-24 DIAGNOSIS — Z951 Presence of aortocoronary bypass graft: Secondary | ICD-10-CM | POA: Insufficient documentation

## 2021-08-24 DIAGNOSIS — M179 Osteoarthritis of knee, unspecified: Secondary | ICD-10-CM | POA: Diagnosis present

## 2021-08-24 DIAGNOSIS — Z79899 Other long term (current) drug therapy: Secondary | ICD-10-CM | POA: Diagnosis not present

## 2021-08-24 DIAGNOSIS — E119 Type 2 diabetes mellitus without complications: Secondary | ICD-10-CM | POA: Diagnosis not present

## 2021-08-24 DIAGNOSIS — M1711 Unilateral primary osteoarthritis, right knee: Principal | ICD-10-CM | POA: Diagnosis present

## 2021-08-24 DIAGNOSIS — I10 Essential (primary) hypertension: Secondary | ICD-10-CM | POA: Diagnosis not present

## 2021-08-24 DIAGNOSIS — Z8546 Personal history of malignant neoplasm of prostate: Secondary | ICD-10-CM | POA: Diagnosis not present

## 2021-08-24 DIAGNOSIS — Z7982 Long term (current) use of aspirin: Secondary | ICD-10-CM | POA: Diagnosis not present

## 2021-08-24 DIAGNOSIS — Z7984 Long term (current) use of oral hypoglycemic drugs: Secondary | ICD-10-CM | POA: Diagnosis not present

## 2021-08-24 DIAGNOSIS — I2510 Atherosclerotic heart disease of native coronary artery without angina pectoris: Secondary | ICD-10-CM | POA: Insufficient documentation

## 2021-08-24 HISTORY — PX: TOTAL KNEE ARTHROPLASTY: SHX125

## 2021-08-24 LAB — GLUCOSE, CAPILLARY
Glucose-Capillary: 164 mg/dL — ABNORMAL HIGH (ref 70–99)
Glucose-Capillary: 160 mg/dL — ABNORMAL HIGH (ref 70–99)
Glucose-Capillary: 189 mg/dL — ABNORMAL HIGH (ref 70–99)
Glucose-Capillary: 256 mg/dL — ABNORMAL HIGH (ref 70–99)
Glucose-Capillary: 203 mg/dL — ABNORMAL HIGH (ref 70–99)
Glucose-Capillary: 237 mg/dL — ABNORMAL HIGH (ref 70–99)

## 2021-08-24 SURGERY — ARTHROPLASTY, KNEE, TOTAL
Anesthesia: Monitor Anesthesia Care | Site: Knee | Laterality: Right

## 2021-08-24 MED ORDER — ROPIVACAINE HCL 7.5 MG/ML IJ SOLN
INTRAMUSCULAR | Status: DC | PRN
  Administered 2021-08-24: 20 mL via PERINEURAL

## 2021-08-24 MED ORDER — FENTANYL CITRATE (PF) 100 MCG/2ML IJ SOLN
INTRAMUSCULAR | Status: DC | PRN
  Administered 2021-08-24 (×2): 50 ug via INTRAVENOUS

## 2021-08-24 MED ORDER — CEFAZOLIN SODIUM-DEXTROSE 2-4 GM/100ML-% IV SOLN
2.0000 g | INTRAVENOUS | Status: AC
  Administered 2021-08-24: 3 g via INTRAVENOUS
  Filled 2021-08-24: qty 100

## 2021-08-24 MED ORDER — OXYCODONE HCL 5 MG PO TABS
5.0000 mg | ORAL_TABLET | ORAL | Status: DC | PRN
  Administered 2021-08-24: 5 mg via ORAL

## 2021-08-24 MED ORDER — TAMSULOSIN HCL 0.4 MG PO CAPS
0.4000 mg | ORAL_CAPSULE | Freq: Every day | ORAL | Status: DC
  Administered 2021-08-24: 0.4 mg via ORAL
  Filled 2021-08-24: qty 1

## 2021-08-24 MED ORDER — FENTANYL CITRATE (PF) 100 MCG/2ML IJ SOLN
INTRAMUSCULAR | Status: AC
  Filled 2021-08-24: qty 2

## 2021-08-24 MED ORDER — GABAPENTIN 300 MG PO CAPS
300.0000 mg | ORAL_CAPSULE | Freq: Three times a day (TID) | ORAL | Status: DC
Start: 2021-08-24 — End: 2021-08-25
  Administered 2021-08-24 – 2021-08-25 (×4): 300 mg via ORAL
  Filled 2021-08-24 (×4): qty 1

## 2021-08-24 MED ORDER — INSULIN ASPART 100 UNIT/ML IJ SOLN
0.0000 [IU] | Freq: Three times a day (TID) | INTRAMUSCULAR | Status: DC
  Administered 2021-08-24: 5 [IU] via SUBCUTANEOUS
  Administered 2021-08-24: 3 [IU] via SUBCUTANEOUS
  Administered 2021-08-25 (×2): 2 [IU] via SUBCUTANEOUS

## 2021-08-24 MED ORDER — CEFAZOLIN SODIUM 1 G IJ SOLR
INTRAMUSCULAR | Status: AC
  Filled 2021-08-24: qty 10

## 2021-08-24 MED ORDER — DOCUSATE SODIUM 100 MG PO CAPS
100.0000 mg | ORAL_CAPSULE | Freq: Two times a day (BID) | ORAL | Status: DC
  Administered 2021-08-24 – 2021-08-25 (×2): 100 mg via ORAL
  Filled 2021-08-24 (×2): qty 1

## 2021-08-24 MED ORDER — DIPHENHYDRAMINE HCL 12.5 MG/5ML PO ELIX: 12.5000 mg | ORAL_SOLUTION | ORAL | Status: DC | PRN

## 2021-08-24 MED ORDER — POTASSIUM CHLORIDE CRYS ER 10 MEQ PO TBCR
10.0000 meq | EXTENDED_RELEASE_TABLET | Freq: Every day | ORAL | Status: DC
  Administered 2021-08-24 – 2021-08-25 (×2): 10 meq via ORAL
  Filled 2021-08-24 (×2): qty 1

## 2021-08-24 MED ORDER — SODIUM CHLORIDE 0.9 % IV SOLN
INTRAVENOUS | Status: DC | PRN
  Administered 2021-08-24: 80 mL

## 2021-08-24 MED ORDER — ONDANSETRON HCL 4 MG/2ML IJ SOLN
INTRAMUSCULAR | Status: DC | PRN
  Administered 2021-08-24: 4 mg via INTRAVENOUS

## 2021-08-24 MED ORDER — BISACODYL 10 MG RE SUPP: 10.0000 mg | Freq: Every day | RECTAL | Status: DC | PRN

## 2021-08-24 MED ORDER — INSULIN ASPART 100 UNIT/ML IJ SOLN
0.0000 [IU] | Freq: Every day | INTRAMUSCULAR | Status: DC
  Administered 2021-08-24: 2 [IU] via SUBCUTANEOUS

## 2021-08-24 MED ORDER — PROPOFOL 10 MG/ML IV BOLUS
INTRAVENOUS | Status: DC | PRN
  Administered 2021-08-24 (×2): 20 mg via INTRAVENOUS

## 2021-08-24 MED ORDER — DEXAMETHASONE SODIUM PHOSPHATE 10 MG/ML IJ SOLN
8.0000 mg | Freq: Once | INTRAMUSCULAR | Status: AC
  Administered 2021-08-24: 8 mg via INTRAVENOUS

## 2021-08-24 MED ORDER — OXYCODONE HCL 5 MG PO TABS
10.0000 mg | ORAL_TABLET | ORAL | Status: DC | PRN
  Administered 2021-08-24 – 2021-08-25 (×5): 10 mg via ORAL
  Filled 2021-08-24 (×6): qty 2

## 2021-08-24 MED ORDER — SODIUM CHLORIDE (PF) 0.9 % IJ SOLN
INTRAMUSCULAR | Status: AC
  Filled 2021-08-24: qty 10

## 2021-08-24 MED ORDER — 0.9 % SODIUM CHLORIDE (POUR BTL) OPTIME
TOPICAL | Status: DC | PRN
  Administered 2021-08-24: 450 mL

## 2021-08-24 MED ORDER — LACTATED RINGERS IV SOLN: INTRAVENOUS | Status: DC

## 2021-08-24 MED ORDER — PHENYLEPHRINE HCL-NACL 20-0.9 MG/250ML-% IV SOLN
INTRAVENOUS | Status: DC | PRN
  Administered 2021-08-24: 50 ug/min via INTRAVENOUS

## 2021-08-24 MED ORDER — PHENYLEPHRINE HCL-NACL 20-0.9 MG/250ML-% IV SOLN
INTRAVENOUS | Status: AC
  Filled 2021-08-24: qty 250

## 2021-08-24 MED ORDER — BUPIVACAINE LIPOSOME 1.3 % IJ SUSP
INTRAMUSCULAR | Status: AC
  Filled 2021-08-24: qty 20

## 2021-08-24 MED ORDER — FLEET ENEMA 7-19 GM/118ML RE ENEM: 1.0000 | ENEMA | Freq: Once | RECTAL | Status: DC | PRN

## 2021-08-24 MED ORDER — METOCLOPRAMIDE HCL 5 MG/ML IJ SOLN: 5.0000 mg | Freq: Three times a day (TID) | INTRAMUSCULAR | Status: DC | PRN

## 2021-08-24 MED ORDER — METHOCARBAMOL 500 MG IVPB - SIMPLE MED
500.0000 mg | Freq: Four times a day (QID) | INTRAVENOUS | Status: DC | PRN
  Filled 2021-08-24: qty 50

## 2021-08-24 MED ORDER — CEFAZOLIN SODIUM-DEXTROSE 2-4 GM/100ML-% IV SOLN
2.0000 g | Freq: Four times a day (QID) | INTRAVENOUS | Status: AC
  Administered 2021-08-24 (×2): 2 g via INTRAVENOUS
  Filled 2021-08-24 (×2): qty 100

## 2021-08-24 MED ORDER — PROPOFOL 500 MG/50ML IV EMUL
INTRAVENOUS | Status: DC | PRN
  Administered 2021-08-24: 130 ug/kg/min via INTRAVENOUS

## 2021-08-24 MED ORDER — FENTANYL CITRATE PF 50 MCG/ML IJ SOSY: 25.0000 ug | PREFILLED_SYRINGE | INTRAMUSCULAR | Status: DC | PRN

## 2021-08-24 MED ORDER — METOPROLOL TARTRATE 12.5 MG HALF TABLET
12.5000 mg | ORAL_TABLET | Freq: Two times a day (BID) | ORAL | Status: DC
  Administered 2021-08-24 – 2021-08-25 (×2): 12.5 mg via ORAL
  Filled 2021-08-24 (×2): qty 1

## 2021-08-24 MED ORDER — POLYETHYLENE GLYCOL 3350 17 G PO PACK: 17.0000 g | PACK | Freq: Every day | ORAL | Status: DC | PRN

## 2021-08-24 MED ORDER — METHOCARBAMOL 500 MG PO TABS
500.0000 mg | ORAL_TABLET | Freq: Four times a day (QID) | ORAL | Status: DC | PRN
  Administered 2021-08-24 – 2021-08-25 (×2): 500 mg via ORAL
  Filled 2021-08-24 (×3): qty 1

## 2021-08-24 MED ORDER — TRANEXAMIC ACID-NACL 1000-0.7 MG/100ML-% IV SOLN
1000.0000 mg | INTRAVENOUS | Status: AC
  Administered 2021-08-24: 1000 mg via INTRAVENOUS
  Filled 2021-08-24: qty 100

## 2021-08-24 MED ORDER — BUPIVACAINE LIPOSOME 1.3 % IJ SUSP: 20.0000 mL | Freq: Once | INTRAMUSCULAR | Status: AC

## 2021-08-24 MED ORDER — RIVAROXABAN 10 MG PO TABS
10.0000 mg | ORAL_TABLET | Freq: Every day | ORAL | Status: DC
  Administered 2021-08-25: 10 mg via ORAL
  Filled 2021-08-24: qty 1

## 2021-08-24 MED ORDER — ACETAMINOPHEN 10 MG/ML IV SOLN
1000.0000 mg | Freq: Four times a day (QID) | INTRAVENOUS | Status: DC
  Administered 2021-08-24: 1000 mg via INTRAVENOUS
  Filled 2021-08-24: qty 100

## 2021-08-24 MED ORDER — BUPIVACAINE IN DEXTROSE 0.75-8.25 % IT SOLN
INTRATHECAL | Status: DC | PRN
  Administered 2021-08-24: 1.8 mL via INTRATHECAL

## 2021-08-24 MED ORDER — MENTHOL 3 MG MT LOZG: 1.0000 | LOZENGE | OROMUCOSAL | Status: DC | PRN

## 2021-08-24 MED ORDER — ROSUVASTATIN CALCIUM 20 MG PO TABS
40.0000 mg | ORAL_TABLET | Freq: Every day | ORAL | Status: DC
  Filled 2021-08-24: qty 2

## 2021-08-24 MED ORDER — PHENOL 1.4 % MT LIQD: 1.0000 | OROMUCOSAL | Status: DC | PRN

## 2021-08-24 MED ORDER — SODIUM CHLORIDE 0.9 % IR SOLN
Status: DC | PRN
  Administered 2021-08-24: 1000 mL

## 2021-08-24 MED ORDER — PRAMIPEXOLE DIHYDROCHLORIDE 0.25 MG PO TABS: 0.1250 mg | ORAL_TABLET | Freq: Every day | ORAL | Status: DC

## 2021-08-24 MED ORDER — FAMOTIDINE 20 MG PO TABS
40.0000 mg | ORAL_TABLET | Freq: Every evening | ORAL | Status: DC
  Administered 2021-08-24: 40 mg via ORAL
  Filled 2021-08-24: qty 2

## 2021-08-24 MED ORDER — PROPOFOL 1000 MG/100ML IV EMUL
INTRAVENOUS | Status: AC
  Filled 2021-08-24: qty 100

## 2021-08-24 MED ORDER — SODIUM CHLORIDE (PF) 0.9 % IJ SOLN
INTRAMUSCULAR | Status: AC
  Filled 2021-08-24: qty 50

## 2021-08-24 MED ORDER — POVIDONE-IODINE 10 % EX SWAB
2.0000 "application " | Freq: Once | CUTANEOUS | Status: AC
  Administered 2021-08-24: 2 via TOPICAL

## 2021-08-24 MED ORDER — AMISULPRIDE (ANTIEMETIC) 5 MG/2ML IV SOLN: 10.0000 mg | Freq: Once | INTRAVENOUS | Status: DC | PRN

## 2021-08-24 MED ORDER — ONDANSETRON HCL 4 MG/2ML IJ SOLN
INTRAMUSCULAR | Status: AC
  Filled 2021-08-24: qty 2

## 2021-08-24 MED ORDER — SODIUM CHLORIDE 0.9 % IV SOLN: INTRAVENOUS | Status: DC

## 2021-08-24 MED ORDER — IRBESARTAN 75 MG PO TABS
75.0000 mg | ORAL_TABLET | Freq: Every day | ORAL | Status: DC
  Filled 2021-08-24: qty 1

## 2021-08-24 MED ORDER — MORPHINE SULFATE (PF) 2 MG/ML IV SOLN: 1.0000 mg | INTRAVENOUS | Status: DC | PRN

## 2021-08-24 MED ORDER — ACETAMINOPHEN 500 MG PO TABS
1000.0000 mg | ORAL_TABLET | Freq: Four times a day (QID) | ORAL | Status: AC
  Administered 2021-08-24 – 2021-08-25 (×3): 1000 mg via ORAL
  Filled 2021-08-24 (×3): qty 2

## 2021-08-24 MED ORDER — FERROUS SULFATE 325 (65 FE) MG PO TABS
325.0000 mg | ORAL_TABLET | Freq: Every day | ORAL | Status: DC
  Administered 2021-08-25: 325 mg via ORAL
  Filled 2021-08-24: qty 1

## 2021-08-24 MED ORDER — ONDANSETRON HCL 4 MG/2ML IJ SOLN: 4.0000 mg | Freq: Four times a day (QID) | INTRAMUSCULAR | Status: DC | PRN

## 2021-08-24 MED ORDER — PROPOFOL 500 MG/50ML IV EMUL
INTRAVENOUS | Status: AC
  Filled 2021-08-24: qty 50

## 2021-08-24 MED ORDER — METOCLOPRAMIDE HCL 5 MG PO TABS
5.0000 mg | ORAL_TABLET | Freq: Three times a day (TID) | ORAL | Status: DC | PRN
  Filled 2021-08-24: qty 2

## 2021-08-24 MED ORDER — FUROSEMIDE 40 MG PO TABS: 40.0000 mg | ORAL_TABLET | Freq: Every day | ORAL | Status: DC

## 2021-08-24 MED ORDER — DEXAMETHASONE SODIUM PHOSPHATE 10 MG/ML IJ SOLN
INTRAMUSCULAR | Status: AC
  Filled 2021-08-24: qty 1

## 2021-08-24 MED ORDER — MIDAZOLAM HCL 5 MG/5ML IJ SOLN
INTRAMUSCULAR | Status: DC | PRN
  Administered 2021-08-24: 2 mg via INTRAVENOUS

## 2021-08-24 MED ORDER — ONDANSETRON HCL 4 MG PO TABS
4.0000 mg | ORAL_TABLET | Freq: Four times a day (QID) | ORAL | Status: DC | PRN
  Filled 2021-08-24: qty 1

## 2021-08-24 MED ORDER — MIDAZOLAM HCL 2 MG/2ML IJ SOLN
INTRAMUSCULAR | Status: AC
  Filled 2021-08-24: qty 2

## 2021-08-24 MED ORDER — FLUTICASONE PROPIONATE 50 MCG/ACT NA SUSP: 2.0000 | Freq: Every day | NASAL | Status: DC | PRN

## 2021-08-24 SURGICAL SUPPLY — 55 items
ATTUNE MED DOME PAT 41 KNEE (Knees) ×1 IMPLANT
ATTUNE PS FEM RT SZ 8 CEM KNEE (Femur) ×1 IMPLANT
ATTUNE PSRP INSR SZ8 12 KNEE (Insert) ×1 IMPLANT
BAG COUNTER SPONGE SURGICOUNT (BAG) ×1 IMPLANT
BAG ZIPLOCK 12X15 (MISCELLANEOUS) ×2 IMPLANT
BASE TIBIAL ROT PLAT SZ 8 KNEE (Knees) IMPLANT
BLADE SAG 18X100X1.27 (BLADE) ×2 IMPLANT
BLADE SAW SGTL 11.0X1.19X90.0M (BLADE) ×2 IMPLANT
BNDG ELASTIC 6X5.8 VLCR STR LF (GAUZE/BANDAGES/DRESSINGS) ×2 IMPLANT
BOWL SMART MIX CTS (DISPOSABLE) ×2 IMPLANT
CEMENT HV SMART SET (Cement) ×4 IMPLANT
COVER SURGICAL LIGHT HANDLE (MISCELLANEOUS) ×2 IMPLANT
CUFF TOURN SGL QUICK 34 (TOURNIQUET CUFF) ×1
CUFF TRNQT CYL 34X4.125X (TOURNIQUET CUFF) ×1 IMPLANT
DRAPE INCISE IOBAN 66X45 STRL (DRAPES) ×1 IMPLANT
DRAPE U-SHAPE 47X51 STRL (DRAPES) ×2 IMPLANT
DRSG AQUACEL AG ADV 3.5X10 (GAUZE/BANDAGES/DRESSINGS) ×2 IMPLANT
DURAPREP 26ML APPLICATOR (WOUND CARE) ×2 IMPLANT
ELECT REM PT RETURN 15FT ADLT (MISCELLANEOUS) ×2 IMPLANT
GLOVE BIO SURGEON STRL SZ 6.5 (GLOVE) IMPLANT
GLOVE BIO SURGEON STRL SZ7.5 (GLOVE) ×1 IMPLANT
GLOVE BIO SURGEON STRL SZ8 (GLOVE) ×2 IMPLANT
GLOVE BIOGEL PI IND STRL 6.5 (GLOVE) IMPLANT
GLOVE BIOGEL PI IND STRL 7.0 (GLOVE) IMPLANT
GLOVE BIOGEL PI IND STRL 8 (GLOVE) ×1 IMPLANT
GLOVE BIOGEL PI INDICATOR 6.5 (GLOVE)
GLOVE BIOGEL PI INDICATOR 7.0 (GLOVE)
GLOVE BIOGEL PI INDICATOR 8 (GLOVE) ×2
GOWN STRL REUS W/ TWL LRG LVL3 (GOWN DISPOSABLE) ×1 IMPLANT
GOWN STRL REUS W/ TWL XL LVL3 (GOWN DISPOSABLE) IMPLANT
GOWN STRL REUS W/TWL LRG LVL3 (GOWN DISPOSABLE) ×1
GOWN STRL REUS W/TWL XL LVL3 (GOWN DISPOSABLE) ×2
HANDPIECE INTERPULSE COAX TIP (DISPOSABLE) ×1
HOLDER FOLEY CATH W/STRAP (MISCELLANEOUS) ×1 IMPLANT
IMMOBILIZER KNEE 20 (SOFTGOODS) ×2
IMMOBILIZER KNEE 20 THIGH 36 (SOFTGOODS) ×1 IMPLANT
KIT TURNOVER KIT A (KITS) ×1 IMPLANT
MANIFOLD NEPTUNE II (INSTRUMENTS) ×2 IMPLANT
NS IRRIG 1000ML POUR BTL (IV SOLUTION) ×2 IMPLANT
PACK TOTAL KNEE CUSTOM (KITS) ×2 IMPLANT
PADDING CAST COTTON 6X4 STRL (CAST SUPPLIES) ×3 IMPLANT
PROTECTOR NERVE ULNAR (MISCELLANEOUS) ×2 IMPLANT
SET HNDPC FAN SPRY TIP SCT (DISPOSABLE) ×1 IMPLANT
SPIKE FLUID TRANSFER (MISCELLANEOUS) ×3 IMPLANT
STRIP CLOSURE SKIN 1/2X4 (GAUZE/BANDAGES/DRESSINGS) ×4 IMPLANT
SUT MNCRL AB 4-0 PS2 18 (SUTURE) ×2 IMPLANT
SUT STRATAFIX 0 PDS 27 VIOLET (SUTURE) ×2
SUT VIC AB 2-0 CT1 27 (SUTURE) ×3
SUT VIC AB 2-0 CT1 TAPERPNT 27 (SUTURE) ×3 IMPLANT
SUTURE STRATFX 0 PDS 27 VIOLET (SUTURE) ×1 IMPLANT
TIBIAL BASE ROT PLAT SZ 8 KNEE (Knees) ×2 IMPLANT
TRAY FOLEY MTR SLVR 16FR STAT (SET/KITS/TRAYS/PACK) ×2 IMPLANT
TUBE SUCTION HIGH CAP CLEAR NV (SUCTIONS) ×2 IMPLANT
WATER STERILE IRR 1000ML POUR (IV SOLUTION) ×4 IMPLANT
WRAP KNEE MAXI GEL POST OP (GAUZE/BANDAGES/DRESSINGS) ×2 IMPLANT

## 2021-08-24 NOTE — Op Note (Signed)
OPERATIVE REPORT-TOTAL KNEE ARTHROPLASTY ? ? ?Pre-operative diagnosis- Osteoarthritis  Right knee(s) ? ?Post-operative diagnosis- Osteoarthritis Right knee(s) ? ?Procedure-  Right  Total Knee Arthroplasty ? ?Surgeon- Dione Plover. Weber Monnier, MD ? ?Assistant- Molli Barrows, PA-C  ? ?Anesthesia-   Adductor canal block and spinal ? ?EBL- 25 ml ?  ?Drains None ? ?Tourniquet time-  ?Total Tourniquet Time Documented: ?Thigh (Right) - 50 minutes ?Total: Thigh (Right) - 50 minutes ?   ? ?Complications- None ? ?Condition-PACU - hemodynamically stable.  ? ?Brief Clinical Note  Garrett Welch is a 71 y.o. year old male with end stage OA of his right knee with progressively worsening pain and dysfunction. He has constant pain, with activity and at rest and significant functional deficits with difficulties even with ADLs. He has had extensive non-op management including analgesics, injections of cortisone and viscosupplements, and home exercise program, but remains in significant pain with significant dysfunction. Radiographs show bone on bone arthritis medial and patellofemoral. He presents now for right Total Knee Arthroplasty.    ? ?Procedure in detail--- ? ? The patient is brought into the operating room and positioned supine on the operating table. After successful administration of  Adductor canal block and spinal,   a tourniquet is placed high on the  Right thigh(s) and the lower extremity is prepped and draped in the usual sterile fashion. Time out is performed by the operating team and then the  Right lower extremity is wrapped in Esmarch, knee flexed and the tourniquet inflated to 300 mmHg.  ?     A midline incision is made with a ten blade through the subcutaneous tissue to the level of the extensor mechanism. A fresh blade is used to make a medial parapatellar arthrotomy. Soft tissue over the proximal medial tibia is subperiosteally elevated to the joint line with a knife and into the semimembranosus bursa  with a Cobb elevator. Soft tissue over the proximal lateral tibia is elevated with attention being paid to avoiding the patellar tendon on the tibial tubercle. The patella is everted, knee flexed 90 degrees and the ACL and PCL are removed. Findings are bone on bone medial and patellofemoral with massive global osteophytes.   ?     The drill is used to create a starting hole in the distal femur and the canal is thoroughly irrigated with sterile saline to remove the fatty contents. The 5 degree Right  valgus alignment guide is placed into the femoral canal and the distal femoral cutting block is pinned to remove 9 mm off the distal femur. Resection is made with an oscillating saw. ?     The tibia is subluxed forward and the menisci are removed. The extramedullary alignment guide is placed referencing proximally at the medial aspect of the tibial tubercle and distally along the second metatarsal axis and tibial crest. The block is pinned to remove 54m off the more deficient medial  side. Resection is made with an oscillating saw. Size 8is the most appropriate size for the tibia and the proximal tibia is prepared with the modular drill and keel punch for that size. ?     The femoral sizing guide is placed and size 8 is most appropriate. Rotation is marked off the epicondylar axis and confirmed by creating a rectangular flexion gap at 90 degrees. The size 8 cutting block is pinned in this rotation and the anterior, posterior and chamfer cuts are made with the oscillating saw. The intercondylar block is then placed and that  cut is made. ?     Trial size 8 tibial component, trial size 8 posterior stabilized femur and a 12  mm posterior stabilized rotating platform insert trial is placed. Full extension is achieved with excellent varus/valgus and anterior/posterior balance throughout full range of motion. The patella is everted and thickness measured to be 27  mm. Free hand resection is taken to 15 mm, a 41 template is  placed, lug holes are drilled, trial patella is placed, and it tracks normally. Osteophytes are removed off the posterior femur with the trial in place. All trials are removed and the cut bone surfaces prepared with pulsatile lavage. Cement is mixed and once ready for implantation, the size 8 tibial implant, size  8 posterior stabilized femoral component, and the size 41 patella are cemented in place and the patella is held with the clamp. The trial insert is placed and the knee held in full extension. The Exparel (20 ml mixed with 60 ml saline) is injected into the extensor mechanism, posterior capsule, medial and lateral gutters and subcutaneous tissues.  All extruded cement is removed and once the cement is hard the permanent 12 mm posterior stabilized rotating platform insert is placed into the tibial tray. ?     The wound is copiously irrigated with saline solution and the extensor mechanism closed with # 0 Stratofix suture. The tourniquet is released for a total tourniquet time of 50  minutes. Flexion against gravity is 140 degrees and the patella tracks normally. Subcutaneous tissue is closed with 2.0 vicryl and subcuticular with running 4.0 Monocryl. The incision is cleaned and dried and steri-strips and a bulky sterile dressing are applied. The limb is placed into a knee immobilizer and the patient is awakened and transported to recovery in stable condition. ?     Please note that a surgical assistant was a medical necessity for this procedure in order to perform it in a safe and expeditious manner. Surgical assistant was necessary to retract the ligaments and vital neurovascular structures to prevent injury to them and also necessary for proper positioning of the limb to allow for anatomic placement of the prosthesis. ? ? ?Dione Plover Domonique Brouillard, MD ? ? ? ?08/24/2021, 8:44 AM ? ? ?

## 2021-08-24 NOTE — Progress Notes (Signed)
Pt set up on cpap with no complications. ?

## 2021-08-24 NOTE — Anesthesia Procedure Notes (Signed)
Spinal ? ?Patient location during procedure: OR ?Start time: 08/24/2021 7:11 AM ?End time: 08/24/2021 7:21 AM ?Reason for block: surgical anesthesia ?Staffing ?Performed: anesthesiologist  ?Anesthesiologist: Duane Boston, MD ?Preanesthetic Checklist ?Completed: patient identified, IV checked, risks and benefits discussed, surgical consent, monitors and equipment checked, pre-op evaluation and timeout performed ?Spinal Block ?Patient position: sitting ?Prep: DuraPrep ?Patient monitoring: cardiac monitor, continuous pulse ox and blood pressure ?Approach: midline ?Location: L2-3 ?Injection technique: single-shot ?Needle ?Needle type: Pencan  ?Needle gauge: 24 G ?Needle length: 9 cm ?Assessment ?Events: CSF return ?Additional Notes ?Functioning IV was confirmed and monitors were applied. Sterile prep and drape, including hand hygiene and sterile gloves were used. The patient was positioned and the spine was prepped. The skin was anesthetized with lidocaine.  Free flow of clear CSF was obtained prior to injecting local anesthetic into the CSF.  The spinal needle aspirated freely following injection.  The needle was carefully withdrawn.  The patient tolerated the procedure well.  ? ? ? ?

## 2021-08-24 NOTE — Transfer of Care (Signed)
Immediate Anesthesia Transfer of Care Note ? ?Patient: Garrett Welch ? ?Procedure(s) Performed: TOTAL KNEE ARTHROPLASTY (Right: Knee) ? ?Patient Location: PACU ? ?Anesthesia Type:Regional and Spinal ? ?Level of Consciousness: sedated ? ?Airway & Oxygen Therapy: Patient Spontanous Breathing and Patient connected to face mask oxygen ? ?Post-op Assessment: Report given to RN and Post -op Vital signs reviewed and stable ? ?Post vital signs: Reviewed and stable ? ?Last Vitals:  ?Vitals Value Taken Time  ?BP    ?Temp    ?Pulse    ?Resp 18 08/24/21 0901  ?SpO2    ?Vitals shown include unvalidated device data. ? ?Last Pain:  ?Vitals:  ? 08/24/21 0550  ?TempSrc: Oral  ?PainSc:   ?   ? ?Patients Stated Pain Goal: 3 (08/24/21 0533) ? ?Complications: No notable events documented. ?

## 2021-08-24 NOTE — Discharge Instructions (Addendum)
? ?Garrett Arabian, MD ?Total Joint Specialist ?[facility] ?[address] ?[place], [place] [address] ?[phone] ? ?TOTAL KNEE REPLACEMENT POSTOPERATIVE DIRECTIONS ? ? ? ?Knee Rehabilitation, Guidelines Following Surgery  ?Results after knee surgery are often greatly improved when you follow the exercise, range of motion and muscle strengthening exercises prescribed by your doctor. Safety measures are also important to protect the knee from further injury. If any of these exercises cause you to have increased pain or swelling in your knee joint, decrease the amount until you are comfortable again and slowly increase them. If you have problems or questions, call your caregiver or physical therapist for advice.  ? ?BLOOD CLOT PREVENTION ?Take a 10 mg Xarelto once a day for three weeks following surgery. Then resume an 81 mg Aspirin once a day. ?You may resume your vitamins/supplements once you have discontinued the Xarelto. ?Do not take any NSAIDs (Advil, Aleve, Ibuprofen, Meloxicam, etc.) until you have discontinued the Xarelto.  ? ?HOME CARE INSTRUCTIONS  ?Remove items at home which could result in a fall. This includes throw rugs or furniture in walking pathways.  ?ICE to the affected knee as much as tolerated. Icing helps control swelling. If the swelling is well controlled you will be more comfortable and rehab easier. Continue to use ice on the knee for pain and swelling from surgery. You may notice swelling that will progress down to the foot and ankle. This is normal after surgery. Elevate the leg when you are not up walking on it.    ?Continue to use the breathing machine which will help keep your temperature down. It is common for your temperature to cycle up and down following surgery, especially at night when you are not up moving around and exerting yourself. The breathing machine keeps your lungs expanded and your temperature down. ?Do not place pillow under the operative  knee, focus on keeping the knee straight while resting ? ?DIET ?You may resume your previous home diet once you are discharged from the hospital. ? ?DRESSING / WOUND CARE / SHOWERING ?Keep your bulky bandage on for 2 days. On the third post-operative day you may remove the Ace bandage and gauze. There is a waterproof adhesive bandage on your skin which will stay in place until your first follow-up appointment. Once you remove this you will not need to place another bandage ?You may begin showering 3 days following surgery, but do not submerge the incision under water. ? ?ACTIVITY ?For the first 5 days, the key is rest and control of pain and swelling ?Do your home exercises twice a day starting on post-operative day 3. On the days you go to physical therapy, just do the home exercises once that day. ?You should rest, ice and elevate the leg for 50 minutes out of every hour. Get up and walk/stretch for 10 minutes per hour. After 5 days you can increase your activity slowly as tolerated. ?Walk with your walker as instructed. Use the walker until you are comfortable transitioning to a cane. Walk with the cane in the opposite hand of the operative leg. You may discontinue the cane once you are comfortable and walking steadily. ?Avoid periods of inactivity such as sitting longer than an hour when not asleep. This helps prevent blood clots.  ?You may discontinue the knee immobilizer once you are able to perform a straight leg raise while lying down. ?You may resume a sexual relationship in one month or when given the OK by your  doctor.  ?You may return to work once you are cleared by your doctor.  ?Do not drive a car for 6 weeks or until released by your surgeon.  ?Do not drive while taking narcotics. ? ?TED HOSE STOCKINGS ?Wear the elastic stockings on both legs for three weeks following surgery during the day. You may remove them at night for sleeping. ? ?WEIGHT BEARING ?Weight bearing as tolerated with assist device  (walker, cane, etc) as directed, use it as long as suggested by your surgeon or therapist, typically at least 4-6 weeks. ? ?POSTOPERATIVE CONSTIPATION PROTOCOL ?Constipation - defined medically as fewer than three stools per week and severe constipation as less than one stool per week. ? ?One of the most common issues patients have following surgery is constipation.  Even if you have a regular bowel pattern at home, your normal regimen is likely to be disrupted due to multiple reasons following surgery.  Combination of anesthesia, postoperative narcotics, change in appetite and fluid intake all can affect your bowels.  In order to avoid complications following surgery, here are some recommendations in order to help you during your recovery period. ? ?Colace (docusate) - Pick up an over-the-counter form of Colace or another stool softener and take twice a day as long as you are requiring postoperative pain medications.  Take with a full glass of water daily.  If you experience loose stools or diarrhea, hold the colace until you stool forms back up. If your symptoms do not get better within 1 week or if they get worse, check with your doctor. ?Dulcolax (bisacodyl) - Pick up over-the-counter and take as directed by the product packaging as needed to assist with the movement of your bowels.  Take with a full glass of water.  Use this product as needed if not relieved by Colace only.  ?MiraLax (polyethylene glycol) - Pick up over-the-counter to have on hand. MiraLax is a solution that will increase the amount of water in your bowels to assist with bowel movements.  Take as directed and can mix with a glass of water, juice, soda, coffee, or tea. Take if you go more than two days without a movement. Do not use MiraLax more than once per day. Call your doctor if you are still constipated or irregular after using this medication for 7 days in a row. ? ?If you continue to have problems with postoperative constipation, please  contact the office for further assistance and recommendations.  If you experience "the worst abdominal pain ever" or develop nausea or vomiting, please contact the office immediatly for further recommendations for treatment. ? ?ITCHING ?If you experience itching with your medications, try taking only a single pain pill, or even half a pain pill at a time.  You can also use Benadryl over the counter for itching or also to help with sleep.  ? ?MEDICATIONS ?See your medication summary on the ?After Visit Summary? that the nursing staff will review with you prior to discharge.  You may have some home medications which will be placed on hold until you complete the course of blood thinner medication.  It is important for you to complete the blood thinner medication as prescribed by your surgeon.  Continue your approved medications as instructed at time of discharge. ? ?PRECAUTIONS ?If you experience chest pain or shortness of breath - call 911 immediately for transfer to the hospital emergency department.  ?If you develop a fever greater that 101 F, purulent drainage from wound, increased redness or  drainage from wound, foul odor from the wound/dressing, or calf pain - CONTACT YOUR SURGEON.   ?                                                ?FOLLOW-UP APPOINTMENTS ?Make sure you keep all of your appointments after your operation with your surgeon and caregivers. You should call the office at the above phone number and make an appointment for approximately two weeks after the date of your surgery or on the date instructed by your surgeon outlined in the "After Visit Summary". ? ?RANGE OF MOTION AND STRENGTHENING EXERCISES  ?Rehabilitation of the knee is important following a knee injury or an operation. After just a few days of immobilization, the muscles of the thigh which control the knee become weakened and shrink (atrophy). Knee exercises are designed to build up the tone and strength of the thigh muscles and to  improve knee motion. Often times heat used for twenty to thirty minutes before working out will loosen up your tissues and help with improving the range of motion but do not use heat for the first two weeks fo

## 2021-08-24 NOTE — Progress Notes (Signed)
Orthopedic Tech Progress Note ?Patient Details:  ?Garrett Welch ?1951-04-08 ?[id number] ?CPM was applied to patient in PACU ?CPM Right Knee ?CPM Right Knee: On ?Right Knee Flexion (Degrees): 40 ?Right Knee Extension (Degrees): 10 ? ?Post Interventions ?Patient Tolerated: Well ? ?Garrett Welch E Jona Erkkila ?08/24/2021, 9:19 AM ? ?

## 2021-08-24 NOTE — Anesthesia Postprocedure Evaluation (Signed)
Anesthesia Post Note ? ?Patient: Garrett Welch ? ?Procedure(s) Performed: TOTAL KNEE ARTHROPLASTY (Right: Knee) ? ?  ? ?Patient location during evaluation: PACU ?Anesthesia Type: MAC and Spinal ?Level of consciousness: awake and alert ?Pain management: pain level controlled ?Vital Signs Assessment: post-procedure vital signs reviewed and stable ?Respiratory status: spontaneous breathing and respiratory function stable ?Cardiovascular status: blood pressure returned to baseline and stable ?Postop Assessment: spinal receding ?Anesthetic complications: no ? ? ?No notable events documented. ? ?Last Vitals:  ?Vitals:  ? 08/24/21 1000 08/24/21 1030  ?BP: (!) 95/52   ?Pulse: (!) 58   ?Resp: 12   ?Temp:  36.7 ?C  ?SpO2: 97%   ?  ?Last Pain:  ?Vitals:  ? 08/24/21 1030  ?TempSrc:   ?PainSc: 0-No pain  ? ? ?  ?  ?  ?  ?  ?  ? ?Telma Pyeatt DANIEL ? ? ? ? ?

## 2021-08-24 NOTE — Anesthesia Procedure Notes (Signed)
Anesthesia Regional Block: Adductor canal block  ? ?Pre-Anesthetic Checklist: , timeout performed,  Correct Patient, Correct Site, Correct Laterality,  Correct Procedure, Correct Position, site marked,  Risks and benefits discussed,  Surgical consent,  Pre-op evaluation,  At surgeon's request and post-op pain management ? ?Laterality: Right ? ?Prep: chloraprep     ?  ?Needles:  ?Injection technique: Single-shot ? ?Needle Type: Stimulator Needle - 80   ? ? ?Needle Length: 10cm  ?Needle Gauge: 21  ? ? ? ?Additional Needles: ? ? ?Narrative:  ?Start time: 08/24/2021 6:46 AM ?End time: 08/24/2021 6:56 AM ?Injection made incrementally with aspirations every 5 mL. ? ?Performed by: Personally  ?Anesthesiologist: Duane Boston, MD ? ? ? ? ?

## 2021-08-24 NOTE — Interval H&P Note (Signed)
History and Physical Interval Note: ? ?08/24/2021 ?6:29 AM ? ?Garrett Welch  has presented today for surgery, with the diagnosis of right knee osteoarthritis.  The various methods of treatment have been discussed with the patient and family. After consideration of risks, benefits and other options for treatment, the patient has consented to  Procedure(s): ?TOTAL KNEE ARTHROPLASTY (Right) as a surgical intervention.  The patient's history has been reviewed, patient examined, no change in status, stable for surgery.  I have reviewed the patient's chart and labs.  Questions were answered to the patient's satisfaction.   ? ? ?[name] ? ? ?

## 2021-08-24 NOTE — Evaluation (Signed)
Physical Therapy Evaluation ?Patient Details ?Name: Garrett Welch ?MRN: [id number] ?DOB: 1950/09/04 ?Today's Date: 08/24/2021 ? ?History of Present Illness ? 71 y.o. male admitted 08/24/21 for R TKA. PMH includes: CAD, CABGx2, HTN, s/p gastric bypass, DM, sleep apnea, prostate cancer.  ?Clinical Impression ? Pt is s/p TKA resulting in the deficits listed below (see PT Problem List). Attempted sit to stand x 3 trials, pt unable to come to full upright position. He stated his buttocks still feel numb. Pt dangled at edge of bed for ~7 minutes. Initiated TKA HEP. Suspect spinal is not yet fully worn off, despite sensation being intact to light tough throughout BLEs. Pt will benefit from skilled PT to increase their independence and safety with mobility to allow discharge to the venue listed below.  ?   ?   ? ?Recommendations for follow up therapy are one component of a multi-disciplinary discharge planning process, led by the attending physician.  Recommendations may be updated based on patient status, additional functional criteria and insurance authorization. ? ?Follow Up Recommendations Follow physician's recommendations for discharge plan and follow up therapies ? ?  ?Assistance Recommended at Discharge Intermittent Supervision/Assistance  ?Patient can return home with the following ? A little help with bathing/dressing/bathroom;Assistance with cooking/housework;Assist for transportation;Help with stairs or ramp for entrance ? ?  ?Equipment Recommendations Rolling walker (2 wheels)  ?Recommendations for Other Services ?    ?  ?Functional Status Assessment Patient has had a recent decline in their functional status and demonstrates the ability to make significant improvements in function in a reasonable and predictable amount of time.  ? ?  ?Precautions / Restrictions Precautions ?Precautions: Knee ?Precaution Booklet Issued: Yes (comment) ?Precaution Comments: instructed pt/wife in no pillow under  knee ?Restrictions ?Weight Bearing Restrictions: No ?Other Position/Activity Restrictions: WBAT RLE  ? ?  ? ?Mobility ? Bed Mobility ?Overal bed mobility: Needs Assistance ?Bed Mobility: Supine to Sit, Sit to Supine ?  ?  ?Supine to sit: Modified independent (Device/Increase time), HOB elevated ?Sit to supine: Min assist ?  ?General bed mobility comments: used bed rail, min A for LEs into bed ?  ? ?Transfers ?Overall transfer level: Needs assistance ?Equipment used: Rolling walker (2 wheels) ?Transfers: Sit to/from Stand ?Sit to Stand: Mod assist, From elevated surface ?  ?  ?  ?  ?  ?General transfer comment: attempted sit to stand x 3 with RW, VCs hand placement, pt unable to come to full upright position, stated his buttocks still feel numb, spinal not yet fully worn off. Scooted laterally towards HOB x 3 independently. ?  ? ?Ambulation/Gait ?  ?  ?  ?  ?  ?  ?  ?General Gait Details: NT ? ?Stairs ?  ?  ?  ?  ?  ? ?Wheelchair Mobility ?  ? ?Modified Rankin (Stroke Patients Only) ?  ? ?  ? ?Balance Overall balance assessment: Needs assistance ?  ?Sitting balance-Leahy Scale: Good ?  ?  ?Standing balance support: Bilateral upper extremity supported ?Standing balance-Leahy Scale: Poor ?  ?  ?  ?  ?  ?  ?  ?  ?  ?  ?  ?  ?   ? ? ? ?Pertinent Vitals/Pain Pain Assessment ?Pain Assessment: No/denies pain  ? ? ?Home Living Family/patient expects to be discharged to:: Private residence ?Living Arrangements: Spouse/significant other ?Available Help at Discharge: Family;Available 24 hours/day ?Type of Home: House ?Home Access: Stairs to enter ?Entrance Stairs-Rails: Right ?Entrance Stairs-Number of Steps: 3 ?  ?  Home Layout: One level ?Home Equipment: Kasandra Knudsen - single point ?   ?  ?Prior Function Prior Level of Function : Independent/Modified Independent ?  ?  ?  ?  ?  ?  ?Mobility Comments: walked with SPC, no falls ?ADLs Comments: independent ?  ? ? ?Hand Dominance  ?   ? ?  ?Extremity/Trunk Assessment  ? Upper Extremity  Assessment ?Upper Extremity Assessment: Overall WFL for tasks assessed ?  ? ?Lower Extremity Assessment ?Lower Extremity Assessment: RLE deficits/detail ?RLE Deficits / Details: SLR +2/5, knee ext +2/5, palpable quad contraction with quad set ?RLE Sensation: WNL ?RLE Coordination: WNL ?  ? ?Cervical / Trunk Assessment ?Cervical / Trunk Assessment: Normal  ?Communication  ? Communication: No difficulties  ?Cognition Arousal/Alertness: Awake/alert ?Behavior During Therapy: [facility] for tasks assessed/performed ?Overall Cognitive Status: Within Functional Limits for tasks assessed ?  ?  ?  ?  ?  ?  ?  ?  ?  ?  ?  ?  ?  ?  ?  ?  ?  ?  ?  ? ?  ?General Comments   ? ?  ?Exercises Total Joint Exercises ?Ankle Circles/Pumps: AROM, Both, 10 reps, Supine ?Quad Sets: AROM, Right, 5 reps, Supine ?Heel Slides: AAROM, Right, 10 reps, Supine  ? ?Assessment/Plan  ?  ?PT Assessment Patient needs continued PT services  ?PT Problem List Decreased strength;Decreased mobility;Decreased activity tolerance;Decreased range of motion;Decreased balance;Decreased knowledge of use of DME ? ?   ?  ?PT Treatment Interventions DME instruction;Gait training;Therapeutic exercise;Therapeutic activities;Patient/family education;Stair training;Functional mobility training;Balance training   ? ?PT Goals (Current goals can be found in the Care Plan section)  ?Acute Rehab PT Goals ?Patient Stated Goal: golf ?PT Goal Formulation: With patient/family ?Time For Goal Achievement: 08/31/21 ?Potential to Achieve Goals: Good ? ?  ?Frequency 7X/week ?  ? ? ?Co-evaluation   ?  ?  ?  ?  ? ? ?  ?AM-PAC PT "6 Clicks" Mobility  ?Outcome Measure Help needed turning from your back to your side while in a flat bed without using bedrails?: A Little ?Help needed moving from lying on your back to sitting on the side of a flat bed without using bedrails?: A Little ?Help needed moving to and from a bed to a chair (including a wheelchair)?: Total ?Help needed standing up from a  chair using your arms (e.g., wheelchair or bedside chair)?: Total ?Help needed to walk in hospital room?: Total ?Help needed climbing 3-5 steps with a railing? : Total ?6 Click Score: 10 ? ?  ?End of Session Equipment Utilized During Treatment: Gait belt ?Activity Tolerance: Treatment limited secondary to medical complications (Comment) (spinal not yet fully worn off) ?Patient left: in bed;with bed alarm set;with nursing/sitter in room;with family/visitor present;with call bell/phone within reach ?Nurse Communication: Mobility status ?PT Visit Diagnosis: Muscle weakness (generalized) (M62.81);Difficulty in walking, not elsewhere classified (R26.2) ?  ? ?Time: 1610-9604 ?PT Time Calculation (min) (ACUTE ONLY): 31 min ? ? ?Charges:   PT Evaluation ?$PT Eval Moderate Complexity: 1 Mod ?PT Treatments ?$Therapeutic Exercise: 8-22 mins ?  ?   ? ? ?Blondell Reveal Kistler PT 08/24/2021  ?Acute Rehabilitation Services ?Pager [phone] ?Office [phone] ? ? ?

## 2021-08-24 NOTE — Addendum Note (Signed)
Addendum  created 08/24/21 1232 by Duane Boston, MD  ? Child order released for a procedure order, Clinical Note Signed, Intraprocedure Blocks edited, Intraprocedure Meds edited, SmartForm saved  ?  ?

## 2021-08-24 NOTE — Progress Notes (Signed)
Physical Therapy Treatment ?Patient Details ?Name: Garrett Welch ?MRN: [id number] ?DOB: 1950-10-21 ?Today's Date: 08/24/2021 ? ? ?History of Present Illness 71 y.o. male admitted 08/24/21 for R TKA. PMH includes: CAD, CABGx2, HTN, s/p gastric bypass, DM, sleep apnea, prostate cancer. ? ?  ?PT Comments  ? ? Much improved activity tolerance, spinal now worn off and pt was able to ambulate 63' with RW without loss of balance, no buckling of RLE. Good progress expected. Pt puts forth good effort.    ?Recommendations for follow up therapy are one component of a multi-disciplinary discharge planning process, led by the attending physician.  Recommendations may be updated based on patient status, additional functional criteria and insurance authorization. ? ?Follow Up Recommendations ? Follow physician's recommendations for discharge plan and follow up therapies ?  ?  ?Assistance Recommended at Discharge Intermittent Supervision/Assistance  ?Patient can return home with the following A little help with bathing/dressing/bathroom;Assistance with cooking/housework;Assist for transportation;Help with stairs or ramp for entrance ?  ?Equipment Recommendations ? Rolling walker (2 wheels)  ?  ?Recommendations for Other Services   ? ? ?  ?Precautions / Restrictions Precautions ?Precautions: Knee ?Precaution Booklet Issued: Yes (comment) ?Precaution Comments: instructed pt/wife in no pillow under knee ?Restrictions ?Weight Bearing Restrictions: No ?Other Position/Activity Restrictions: WBAT RLE  ?  ? ?Mobility ? Bed Mobility ?Overal bed mobility: Needs Assistance ?Bed Mobility: Supine to Sit ?  ?  ?Supine to sit: Modified independent (Device/Increase time), HOB elevated ?Sit to supine: Min assist ?  ?General bed mobility comments: used bed rail ?  ? ?Transfers ?Overall transfer level: Needs assistance ?Equipment used: Rolling walker (2 wheels) ?Transfers: Sit to/from Stand ?Sit to Stand: From elevated surface, Min assist ?  ?   ?  ?  ?  ?General transfer comment: VCs hand placement ?  ? ?Ambulation/Gait ?Ambulation/Gait assistance: Modified independent (Device/Increase time) ?Gait Distance (Feet): 60 Feet ?Assistive device: Rolling walker (2 wheels) ?Gait Pattern/deviations: Step-to pattern ?Gait velocity: decr ?  ?  ?General Gait Details: VCs sequencing, no loss of balance ? ? ?Stairs ?  ?  ?  ?  ?  ? ? ?Wheelchair Mobility ?  ? ?Modified Rankin (Stroke Patients Only) ?  ? ? ?  ?Balance Overall balance assessment: Needs assistance ?  ?Sitting balance-Leahy Scale: Good ?  ?  ?Standing balance support: Bilateral upper extremity supported ?Standing balance-Leahy Scale: Poor ?  ?  ?  ?  ?  ?  ?  ?  ?  ?  ?  ?  ?  ? ?  ?Cognition Arousal/Alertness: Awake/alert ?Behavior During Therapy: [facility] for tasks assessed/performed ?Overall Cognitive Status: Within Functional Limits for tasks assessed ?  ?  ?  ?  ?  ?  ?  ?  ?  ?  ?  ?  ?  ?  ?  ?  ?  ?  ?  ? ?  ?Exercises Total Joint Exercises ?Ankle Circles/Pumps: AROM, Both, 10 reps, Supine ?Quad Sets: AROM, Right, 5 reps, Supine ?Heel Slides: AAROM, Right, 10 reps, Supine ? ?  ?General Comments   ?  ?  ? ?Pertinent Vitals/Pain Pain Assessment ?Pain Assessment: 0-10 ?Pain Score: 3  ?Pain Location: R thigh ?Pain Descriptors / Indicators: Sore ?Pain Intervention(s): Limited activity within patient's tolerance, Monitored during session, Premedicated before session, Ice applied  ? ? ?Home Living Family/patient expects to be discharged to:: Private residence ?Living Arrangements: Spouse/significant other ?Available Help at Discharge: Family;Available 24 hours/day ?Type of Home: House ?Home  Access: Stairs to enter ?Entrance Stairs-Rails: Right ?Entrance Stairs-Number of Steps: 3 ?  ?Home Layout: One level ?Home Equipment: Kasandra Knudsen - single point ?   ?  ?Prior Function    ?  ?  ?   ? ?PT Goals (current goals can now be found in the care plan section) Acute Rehab PT Goals ?Patient Stated Goal: golf ?PT Goal  Formulation: With patient/family ?Time For Goal Achievement: 08/31/21 ?Potential to Achieve Goals: Good ?Progress towards PT goals: Progressing toward goals ? ?  ?Frequency ? ? ? 7X/week ? ? ? ?  ?PT Plan    ? ? ?Co-evaluation   ?  ?  ?  ?  ? ?  ?AM-PAC PT "6 Clicks" Mobility   ?Outcome Measure ? Help needed turning from your back to your side while in a flat bed without using bedrails?: A Little ?Help needed moving from lying on your back to sitting on the side of a flat bed without using bedrails?: A Little ?Help needed moving to and from a bed to a chair (including a wheelchair)?: A Little ?Help needed standing up from a chair using your arms (e.g., wheelchair or bedside chair)?: A Little ?Help needed to walk in hospital room?: A Little ?Help needed climbing 3-5 steps with a railing? : A Lot ?6 Click Score: 17 ? ?  ?End of Session Equipment Utilized During Treatment: Gait belt ?Activity Tolerance: Patient tolerated treatment well (spinal not yet fully worn off) ?Patient left: with family/visitor present;with call bell/phone within reach;in chair;with chair alarm set ?Nurse Communication: Mobility status ?PT Visit Diagnosis: Muscle weakness (generalized) (M62.81);Difficulty in walking, not elsewhere classified (R26.2) ?  ? ? ?Time: 1610-9604 ?PT Time Calculation (min) (ACUTE ONLY): 29 min ? ?Charges:  $Gait Training: 8-22 mins ?$Therapeutic Exercise: 8-22 mins          ?          ? ?Philomena Doheny PT 08/24/2021  ?Acute Rehabilitation Services ?Pager [phone] ?Office [phone] ? ? ?

## 2021-08-25 ENCOUNTER — Encounter (HOSPITAL_COMMUNITY): Payer: Self-pay | Admitting: Orthopedic Surgery

## 2021-08-25 DIAGNOSIS — M1711 Unilateral primary osteoarthritis, right knee: Secondary | ICD-10-CM | POA: Diagnosis not present

## 2021-08-25 LAB — BASIC METABOLIC PANEL
Anion gap: 7 (ref 5–15)
BUN: 23 mg/dL (ref 8–23)
CO2: 23 mmol/L (ref 22–32)
Calcium: 8.2 mg/dL — ABNORMAL LOW (ref 8.9–10.3)
Chloride: 106 mmol/L (ref 98–111)
Creatinine, Ser: 0.92 mg/dL (ref 0.61–1.24)
GFR, Estimated: >60 mL/min (ref >60)
Glucose, Bld: 154 mg/dL — ABNORMAL HIGH (ref 70–99)
Potassium: 4.2 mmol/L (ref 3.5–5.1)
Sodium: 136 mmol/L (ref 135–145)

## 2021-08-25 LAB — CBC
HCT: 31.9 % — ABNORMAL LOW (ref 39.0–52.0)
Hemoglobin: 10.6 g/dL — ABNORMAL LOW (ref 13.0–17.0)
MCH: 32.4 pg (ref 26.0–34.0)
MCHC: 33.2 g/dL (ref 30.0–36.0)
MCV: 97.6 fL (ref 80.0–100.0)
Platelets: 199 10*3/uL (ref 150–400)
RBC: 3.27 MIL/uL — ABNORMAL LOW (ref 4.22–5.81)
RDW: 12 % (ref 11.5–15.5)
WBC: 13.2 10*3/uL — ABNORMAL HIGH (ref 4.0–10.5)
nRBC: 0 % (ref 0.0–0.2)

## 2021-08-25 LAB — GLUCOSE, CAPILLARY
Glucose-Capillary: 146 mg/dL — ABNORMAL HIGH (ref 70–99)
Glucose-Capillary: 138 mg/dL — ABNORMAL HIGH (ref 70–99)

## 2021-08-25 MED ORDER — GABAPENTIN 300 MG PO CAPS: ORAL_CAPSULE | ORAL | 0 refills | Status: DC

## 2021-08-25 MED ORDER — RIVAROXABAN 10 MG PO TABS: 10.0000 mg | ORAL_TABLET | Freq: Every day | ORAL | 0 refills | Status: AC

## 2021-08-25 MED ORDER — OXYCODONE HCL 5 MG PO TABS: 5.0000 mg | ORAL_TABLET | Freq: Four times a day (QID) | ORAL | 0 refills | Status: DC | PRN

## 2021-08-25 MED ORDER — METHOCARBAMOL 500 MG PO TABS: 500.0000 mg | ORAL_TABLET | Freq: Four times a day (QID) | ORAL | 0 refills | Status: DC | PRN

## 2021-08-25 NOTE — Plan of Care (Signed)
  Problem: Nutrition: Goal: Adequate nutrition will be maintained Outcome: Progressing   

## 2021-08-25 NOTE — TOC Transition Note (Signed)
Transition of Care (TOC) - CM/SW Discharge Note ? ? ?Patient Details  ?Name: Garrett Welch ?MRN: [id number] ?Date of Birth: 31-Aug-1950 ? ?Transition of Care (TOC) CM/SW Contact:  ?Selita Staiger, LCSW ?Phone Number: ?08/25/2021, 10:36 AM ? ? ?Clinical Narrative:    ?Met with pt and confirming receipt of rolling walker via [employer].  Plan for OPPT @ [facility].  No TOC needs. ? ? ?Final next level of care: OP Rehab ?Barriers to Discharge: No Barriers Identified ? ? ?Patient Goals and CMS Choice ?Patient states their goals for this hospitalization and ongoing recovery are:: return home ?  ?  ? ?Discharge Placement ?  ?           ?  ?  ?  ?  ? ?Discharge Plan and Services ?  ?  ?           ?DME Arranged: Walker rolling ?DME Agency: [employer] ?  ?  ?  ?  ?  ?  ?  ?  ? ?Social Determinants of Health (SDOH) Interventions ?  ? ? ?Readmission Risk Interventions ?   ? View : No data to display.  ?  ?  ?  ? ? ? ? ? ?

## 2021-08-25 NOTE — Progress Notes (Signed)
Physical Therapy Treatment ?Patient Details ?Name: Garrett Welch ?MRN: [id number] ?DOB: Apr 05, 1951 ?Today's Date: 08/25/2021 ? ? ?History of Present Illness 71 y.o. male admitted 08/24/21 for R TKA. PMH includes: CAD, CABGx2, HTN, s/p gastric bypass, DM, sleep apnea, prostate cancer. ? ?  ?PT Comments  ? ? POD # 1 pm session ?Assisted with amb an increased distance then practiced stairs.  General stair comments: Instructed pt to wear KI for stairs fro increased support/stability and use one crutch opposite of rail.  Practiced twice.  Spouse present during.  Perofrmed well. Addressed all mobility questions, discussed appropriate activity, educated on use of ICE.  Pt ready for D/C to home. ?Pt plans to return in September for TKR "other knee".   ?Recommendations for follow up therapy are one component of a multi-disciplinary discharge planning process, led by the attending physician.  Recommendations may be updated based on patient status, additional functional criteria and insurance authorization. ? ?Follow Up Recommendations ? Follow physician's recommendations for discharge plan and follow up therapies ?  ?  ?Assistance Recommended at Discharge Intermittent Supervision/Assistance  ?Patient can return home with the following A little help with bathing/dressing/bathroom;Assistance with cooking/housework;Assist for transportation;Help with stairs or ramp for entrance ?  ?Equipment Recommendations ? Rolling walker (2 wheels)  ?  ?Recommendations for Other Services   ? ? ?  ?Precautions / Restrictions Precautions ?Precautions: Knee ?Precaution Comments: instructed no pillow under knee ?Restrictions ?Weight Bearing Restrictions: No ?Other Position/Activity Restrictions: WBAT RLE  ?  ? ?Mobility ? Bed Mobility ?Overal bed mobility: Needs Assistance ?Bed Mobility: Supine to Sit ?  ?  ?Supine to sit: Modified independent (Device/Increase time) ?  ?  ?General bed mobility comments: demonstarted and instructed how to  use belt to self assist ?  ? ?Transfers ?Overall transfer level: Needs assistance ?Equipment used: Rolling walker (2 wheels) ?Transfers: Sit to/from Stand ?Sit to Stand: From elevated surface, Supervision, Min guard ?  ?  ?  ?  ?  ?General transfer comment: 25% VC's on proper hand placemebnt as well as leg extension prior to sit ?  ? ?Ambulation/Gait ?Ambulation/Gait assistance: Min assist ?Gait Distance (Feet): 35 Feet ?Assistive device: Rolling walker (2 wheels) ?Gait Pattern/deviations: Step-to pattern, Decreased stance time - right ?Gait velocity: decreased ?  ?  ?General Gait Details: 25% VC's on proper sequencing, proper walker to self distance and safety with turns,. ? ? ?Stairs ?Stairs: Yes ?Stairs assistance: Min assist ?Stair Management: One rail Right, Step to pattern, Forwards, With crutches ?Number of Stairs: 4 ?General stair comments: Instructed pt to wear KI for stairs fro increased support/stability and use one crutch opposite of rail.  Practiced twice.  Spouse present during.  Perofrmed well. ? ? ?Wheelchair Mobility ?  ? ?Modified Rankin (Stroke Patients Only) ?  ? ? ?  ?Balance   ?  ?  ?  ?  ?  ?  ?  ?  ?  ?  ?  ?  ?  ?  ?  ?  ?  ?  ?  ? ?  ?Cognition Arousal/Alertness: Awake/alert ?Behavior During Therapy: [facility] for tasks assessed/performed ?Overall Cognitive Status: Within Functional Limits for tasks assessed ?  ?  ?  ?  ?  ?  ?  ?  ?  ?  ?  ?  ?  ?  ?  ?  ?General Comments: AxO x 3 very pleasant and motivated ?  ?  ? ?  ?Exercises   ? ?  ?General  Comments   ?  ?  ? ?Pertinent Vitals/Pain Pain Assessment ?Pain Assessment: 0-10 ?Pain Score: 5  ?Pain Location: R knee and thigh ?Pain Descriptors / Indicators: Discomfort, Tender, Operative site guarding ?Pain Intervention(s): Monitored during session, Premedicated before session, Repositioned, Ice applied  ? ? ?Home Living   ?  ?  ?  ?  ?  ?  ?  ?  ?  ?   ?  ?Prior Function    ?  ?  ?   ? ?PT Goals (current goals can now be found in the care plan  section) Progress towards PT goals: Progressing toward goals ? ?  ?Frequency ? ? ? 7X/week ? ? ? ?  ?PT Plan Current plan remains appropriate  ? ? ?Co-evaluation   ?  ?  ?  ?  ? ?  ?AM-PAC PT "6 Clicks" Mobility   ?Outcome Measure ? Help needed turning from your back to your side while in a flat bed without using bedrails?: A Little ?Help needed moving from lying on your back to sitting on the side of a flat bed without using bedrails?: A Little ?Help needed moving to and from a bed to a chair (including a wheelchair)?: A Little ?Help needed standing up from a chair using your arms (e.g., wheelchair or bedside chair)?: A Little ?Help needed to walk in hospital room?: A Little ?Help needed climbing 3-5 steps with a railing? : A Little ?6 Click Score: 18 ? ?  ?End of Session Equipment Utilized During Treatment: Gait belt ?Activity Tolerance: Patient tolerated treatment well ?Patient left: with family/visitor present;with call bell/phone within reach;in chair;with chair alarm set ?Nurse Communication: Mobility status ?PT Visit Diagnosis: Muscle weakness (generalized) (M62.81);Difficulty in walking, not elsewhere classified (R26.2) ?  ? ? ?Time: 5427-0623 ?PT Time Calculation (min) (ACUTE ONLY): 42 min ? ?Charges:  $Gait Training: 8-22 mins ?$Therapeutic Activity: 8-22 mins ?$Self Care/Home Management: 8-22          ?          ? ?{Damarri Rampy  PTA ?Acute  Rehabilitation Services ?Pager      [phone] ?Office      [phone] ? ?

## 2021-08-25 NOTE — Progress Notes (Signed)
? ?Subjective: ?1 Day Post-Op Procedure(s) (LRB): ?TOTAL KNEE ARTHROPLASTY (Right) ?Patient reports pain as mild.   ?Patient seen in rounds by Dr. Wynelle Link. ?Patient is well, and has had no acute complaints or problems No issues overnight. Denies chest pain, SOB, or calf pain. Foley catheter removed this AM.  ?We will continue therapy today, ambulated 16' yesterday.  ? ?Objective: ?Vital signs in last 24 hours: ?Temp:  [97.5 ?F (36.4 ?C)-99.2 ?F (37.3 ?C)] 97.5 ?F (36.4 ?C) (05/02 0600) ?Pulse Rate:  [57-105] 63 (05/02 0600) ?Resp:  [12-23] 18 (05/02 0600) ?BP: (95-131)/(50-105) 131/105 (05/02 0600) ?SpO2:  [93 %-100 %] 96 % (05/02 0600) ? ?Intake/Output from previous day: ? ?Intake/Output Summary (Last 24 hours) at 08/25/2021 0729 ?Last data filed at 08/25/2021 0600 ?Gross per 24 hour  ?Intake 2669.77 ml  ?Output 3290 ml  ?Net -620.23 ml  ?  ? ?Intake/Output this shift: ?No intake/output data recorded. ? ?Labs: ?Recent Labs  ?  08/25/21 ?0331  ?HGB 10.6*  ? ?Recent Labs  ?  08/25/21 ?0331  ?WBC 13.2*  ?RBC 3.27*  ?HCT 31.9*  ?PLT 199  ? ?Recent Labs  ?  08/25/21 ?0331  ?NA 136  ?K 4.2  ?CL 106  ?CO2 23  ?BUN 23  ?CREATININE 0.92  ?GLUCOSE 154*  ?CALCIUM 8.2*  ? ?No results for input(s): LABPT, INR in the last 72 hours. ? ?Exam: ?General - Patient is Alert and Oriented ?Extremity - Neurologically intact ?Neurovascular intact ?Sensation intact distally ?Dorsiflexion/Plantar flexion intact ?Dressing - dressing C/D/I ?Motor Function - intact, moving foot and toes well on exam.  ? ?Past Medical History:  ?Diagnosis Date  ? Anemia   ? Atrial flutter ([place]) 09/16/2017  ? S/p RFCA // EF was 30% in setting of AFl w/ RVR // EF improved in NSR (Echo 8/19: Mod conc LVH, EF 50-55, Gr 1 DD, mild dilated ascending aorta (40 mm), MAC, trivial TR)  ? CAD (coronary artery disease) 04/06/2018  ? LHC 10/19: dLM 40-50, LAD 100 CTO, D1 99, LCx 99, RI 60; R-L collats, EF normal // s/p CABG 11/19 (L-LAD, RIMA-Dx)  ? Cancer [facility])   ? Diabetes  mellitus without complication ([place])   ? HTN (hypertension)   ? Hyperlipidemia LDL goal <70   ? OA (osteoarthritis)   ? right knee  ? PAC (premature atrial contraction)   ? Prostate cancer ([place])   ? Sleep apnea   ? cpap  ? ? ?Assessment/Plan: ?1 Day Post-Op Procedure(s) (LRB): ?TOTAL KNEE ARTHROPLASTY (Right) ?Principal Problem: ?  OA (osteoarthritis) of knee ?Active Problems: ?  Osteoarthritis of right knee ? ?Estimated body mass index is 39 kg/m? as calculated from the following: ?  Height as of this encounter: _0  (1.905 m). ?  Weight as of this encounter: 141.5 kg. ?Advance diet ?Up with therapy ?D/C IV fluids ? ? ?Patient's anticipated LOS is less than 2 midnights, meeting these requirements: ?- Lives within 1 hour of care ?- Has a competent adult at home to recover with post-op recover ?- NO history of ? - Chronic pain requiring opioids ? - Heart failure ? - Heart attack ? - Stroke ? - DVT/VTE ? - Respiratory Failure/COPD ? - Renal failure ? - Anemia ? - Advanced Liver disease ? ?DVT Prophylaxis - Xarelto ?Weight bearing as tolerated. ?Continue therapy. ? ?Plan is to go Home after hospital stay. ?Plan for discharge later today if progresses with therapy and meeting goals. ?Scheduled for OPPT at [facility]. ?Follow-up in the office  in 2 weeks. ? ?The PDMP database was reviewed today prior to any opioid medications being prescribed to this patient. ? ?Theresa Duty, PA-C ?Orthopedic Surgery ?[phone] ?08/25/2021, 7:29 AM ? ?

## 2021-08-25 NOTE — Progress Notes (Signed)
24 hour chart audit completed 

## 2021-08-25 NOTE — Progress Notes (Signed)
Patient discharged to home w/ wife. Given all belongings, instructions, equipment. Verbalized understanding of all instructions. Escorted to pov via w/c. 

## 2021-08-25 NOTE — Progress Notes (Signed)
Physical Therapy Treatment ?Patient Details ?Name: Garrett Welch ?MRN: [id number] ?DOB: 08-03-1950 ?Today's Date: 08/25/2021 ? ? ?History of Present Illness 71 y.o. male admitted 08/24/21 for R TKA. PMH includes: CAD, CABGx2, HTN, s/p gastric bypass, DM, sleep apnea, prostate cancer. ? ?  ?PT Comments  ? ? POD # 1 am session ?AxO x 3 very pleasant.  Spouse present during session and very helpful.  Assisted OOB to amb in hallway required increased time.  General bed mobility comments: demonstarted and instructed how to use belt to self assist.  General transfer comment: 25% VC's on proper hand placemebnt as well as leg extension prior to sit.  General Gait Details: 25% VC's on proper sequencing, proper walker to self distance and safety with turns,. Then returned to room to perform some TE's following HEP handout.  Instructed on proper tech, freq as well as use of ICE.   ?Pt will need another PT session to address stairs. ?  ?Recommendations for follow up therapy are one component of a multi-disciplinary discharge planning process, led by the attending physician.  Recommendations may be updated based on patient status, additional functional criteria and insurance authorization. ? ?Follow Up Recommendations ? Follow physician's recommendations for discharge plan and follow up therapies ?  ?  ?Assistance Recommended at Discharge Intermittent Supervision/Assistance  ?Patient can return home with the following A little help with bathing/dressing/bathroom;Assistance with cooking/housework;Assist for transportation;Help with stairs or ramp for entrance ?  ?Equipment Recommendations ? Rolling walker (2 wheels)  ?  ?Recommendations for Other Services   ? ? ?  ?Precautions / Restrictions Precautions ?Precautions: Knee ?Precaution Comments: instructed no pillow under knee ?Restrictions ?Weight Bearing Restrictions: No ?Other Position/Activity Restrictions: WBAT RLE  ?  ? ?Mobility ? Bed Mobility ?Overal bed mobility:  Needs Assistance ?Bed Mobility: Supine to Sit ?  ?  ?Supine to sit: Modified independent (Device/Increase time) ?  ?  ?General bed mobility comments: demonstarted and instructed how to use belt to self assist ?  ? ?Transfers ?Overall transfer level: Needs assistance ?Equipment used: Rolling walker (2 wheels) ?Transfers: Sit to/from Stand ?Sit to Stand: From elevated surface, Supervision, Min guard ?  ?  ?  ?  ?  ?General transfer comment: 25% VC's on proper hand placemebnt as well as leg extension prior to sit ?  ? ?Ambulation/Gait ?Ambulation/Gait assistance: Min assist ?Gait Distance (Feet): 75 Feet ?Assistive device: Rolling walker (2 wheels) ?Gait Pattern/deviations: Step-to pattern, Decreased stance time - right ?Gait velocity: decreased ?  ?  ?General Gait Details: 25% VC's on proper sequencing, proper walker to self distance and safety with turns,. ? ? ?Stairs ?  ?  ?  ?  ?  ? ? ?Wheelchair Mobility ?  ? ?Modified Rankin (Stroke Patients Only) ?  ? ? ?  ?Balance   ?  ?  ?  ?  ?  ?  ?  ?  ?  ?  ?  ?  ?  ?  ?  ?  ?  ?  ?  ? ?  ?Cognition Arousal/Alertness: Awake/alert ?Behavior During Therapy: [facility] for tasks assessed/performed ?Overall Cognitive Status: Within Functional Limits for tasks assessed ?  ?  ?  ?  ?  ?  ?  ?  ?  ?  ?  ?  ?  ?  ?  ?  ?General Comments: AxO x 3 very pleasant and motivated ?  ?  ? ?  ?Exercises  Total Knee Replacement TE's following HEP handout ?  10 reps B LE ankle pumps ?05 reps towel squeezes ?05 reps knee presses ?05 reps heel slides  ?05 reps SAQ's ?05 reps SLR's ?05 reps ABD ?Educated on use of gait belt to assist with TE's ?Followed by ICE ? ? ?  ?General Comments   ?  ?  ? ?Pertinent Vitals/Pain Pain Assessment ?Pain Assessment: 0-10 ?Pain Score: 5  ?Pain Location: R knee and thigh ?Pain Descriptors / Indicators: Discomfort, Tender, Operative site guarding ?Pain Intervention(s): Monitored during session, Premedicated before session, Repositioned, Ice applied  ? ? ?Home Living   ?   ?  ?  ?  ?  ?  ?  ?  ?  ?   ?  ?Prior Function    ?  ?  ?   ? ?PT Goals (current goals can now be found in the care plan section) Progress towards PT goals: Progressing toward goals ? ?  ?Frequency ? ? ? 7X/week ? ? ? ?  ?PT Plan Current plan remains appropriate  ? ? ?Co-evaluation   ?  ?  ?  ?  ? ?  ?AM-PAC PT "6 Clicks" Mobility   ?Outcome Measure ? Help needed turning from your back to your side while in a flat bed without using bedrails?: A Little ?Help needed moving from lying on your back to sitting on the side of a flat bed without using bedrails?: A Little ?Help needed moving to and from a bed to a chair (including a wheelchair)?: A Little ?Help needed standing up from a chair using your arms (e.g., wheelchair or bedside chair)?: A Little ?Help needed to walk in hospital room?: A Little ?Help needed climbing 3-5 steps with a railing? : A Little ?6 Click Score: 18 ? ?  ?End of Session Equipment Utilized During Treatment: Gait belt ?Activity Tolerance: Patient tolerated treatment well ?Patient left: with family/visitor present;with call bell/phone within reach;in chair;with chair alarm set ?Nurse Communication: Mobility status ?PT Visit Diagnosis: Muscle weakness (generalized) (M62.81);Difficulty in walking, not elsewhere classified (R26.2) ?  ? ? ?Time: 6468-0321 ?PT Time Calculation (min) (ACUTE ONLY): 36 min ? ?Charges:  $Gait Training: 8-22 mins ?$Therapeutic Exercise: 8-22 mins          ?          ? ?Rica Koyanagi  PTA ?Acute  Rehabilitation Services ?Pager      [phone] ?Office      [phone] ? ?

## 2021-08-26 NOTE — Discharge Summary (Signed)
Patient ID: ?Lanora Manis III ?MRN: [id number] ?DOB/AGE: 1950-10-10 71 y.o. ? ?Admit date: 08/24/2021 ?Discharge date: 08/25/2021 ? ?Admission Diagnoses:  ?Principal Problem: ?  OA (osteoarthritis) of knee ?Active Problems: ?  Osteoarthritis of right knee ? ? ?Discharge Diagnoses:  ?Same ? ?Past Medical History:  ?Diagnosis Date  ? Anemia   ? Atrial flutter ([place]) 09/16/2017  ? S/p RFCA // EF was 30% in setting of AFl w/ RVR // EF improved in NSR (Echo 8/19: Mod conc LVH, EF 50-55, Gr 1 DD, mild dilated ascending aorta (40 mm), MAC, trivial TR)  ? CAD (coronary artery disease) 04/06/2018  ? LHC 10/19: dLM 40-50, LAD 100 CTO, D1 99, LCx 99, RI 60; R-L collats, EF normal // s/p CABG 11/19 (L-LAD, RIMA-Dx)  ? Cancer [facility])   ? Diabetes mellitus without complication ([place])   ? HTN (hypertension)   ? Hyperlipidemia LDL goal <70   ? OA (osteoarthritis)   ? right knee  ? PAC (premature atrial contraction)   ? Prostate cancer ([place])   ? Sleep apnea   ? cpap  ? ? ?Surgeries: Procedure(s): ?TOTAL KNEE ARTHROPLASTY on 08/24/2021 ?  ?Consultants:  ? ?Discharged Condition: Improved ? ?Hospital Course: Yuuki Skeens III is an 71 y.o. male who was admitted 08/24/2021 for operative treatment ofOA (osteoarthritis) of knee. Patient has severe unremitting pain that affects sleep, daily activities, and work/hobbies. After pre-op clearance the patient was taken to the operating room on 08/24/2021 and underwent  Procedure(s): ?TOTAL KNEE ARTHROPLASTY.   ? ?Patient was given perioperative antibiotics:  ?Anti-infectives (From admission, onward)  ? ? Start     Dose/Rate Route Frequency Ordered Stop  ? 08/24/21 1400  ceFAZolin (ANCEF) IVPB 2g/100 mL premix       ? 2 g ?200 mL/hr over 30 Minutes Intravenous Every 6 hours 08/24/21 0852 08/25/21 0353  ? 08/24/21 0600  ceFAZolin (ANCEF) IVPB 2g/100 mL premix       ? 2 g ?200 mL/hr over 30 Minutes Intravenous On call to O.R. 08/24/21 6294 08/24/21 0721  ? ?  ?  ? ?Patient was given sequential  compression devices, early ambulation, and chemoprophylaxis to prevent DVT. ? ?Patient benefited maximally from hospital stay and there were no complications.   ? ?Recent vital signs: Patient Vitals for the past 24 hrs: ? BP Temp Temp src Pulse Resp SpO2  ?08/25/21 1021 107/67 97.9 ?F (36.6 ?C) Oral (!) 40 17 95 %  ?  ? ?Recent laboratory studies:  ?Recent Labs  ?  08/25/21 ?0331  ?WBC 13.2*  ?HGB 10.6*  ?HCT 31.9*  ?PLT 199  ?NA 136  ?K 4.2  ?CL 106  ?CO2 23  ?BUN 23  ?CREATININE 0.92  ?GLUCOSE 154*  ?CALCIUM 8.2*  ? ? ? ?Discharge Medications:   ?Allergies as of 08/25/2021   ? ?   Reactions  ? Tape Rash  ? With prolonged use--blisters  ? Tramadol Hives, Rash, Other (See Comments)  ? Skin irritation  ? Amoxicillin Nausea Only  ? Amoxicillin-pot Clavulanate Nausea Only  ? ?  ? ?  ?Medication List  ?  ? ?STOP taking these medications   ? ?aspirin EC 81 MG tablet ?  ?multivitamin with minerals tablet ?  ? ?  ? ?TAKE these medications   ? ?albuterol 108 (90 Base) MCG/ACT inhaler ?Commonly known as: VENTOLIN HFA ?Inhale 2 puffs into the lungs every 6 (six) hours as needed for wheezing or shortness of breath. ?  ?famotidine 40 MG  tablet ?Commonly known as: PEPCID ?Take 40 mg by mouth every evening. ?  ?ferrous sulfate 325 (65 FE) MG EC tablet ?Take 1 tablet (325 mg total) by mouth daily with breakfast. ?  ?fluticasone 50 MCG/ACT nasal spray ?Commonly known as: FLONASE ?Place 2 sprays into both nostrils daily as needed for allergies. ?  ?furosemide 40 MG tablet ?Commonly known as: LASIX ?Take 1 tablet (40 mg total) by mouth daily. ?  ?gabapentin 300 MG capsule ?Commonly known as: NEURONTIN ?Take a 300 mg capsule three times a day for two weeks following surgery.Then take a 300 mg capsule two times a day for two weeks. Then take a 300 mg capsule once a day for two weeks. Then discontinue. ?  ?irbesartan 75 MG tablet ?Commonly known as: AVAPRO ?TAKE 1 TABLET BY MOUTH EVERY DAY ?  ?metFORMIN 500 MG tablet ?Commonly known as:  GLUCOPHAGE ?Take 1,000 mg by mouth 2 (two) times daily. ?  ?methocarbamol 500 MG tablet ?Commonly known as: ROBAXIN ?Take 1 tablet (500 mg total) by mouth every 6 (six) hours as needed for muscle spasms. ?  ?metoprolol tartrate 25 MG tablet ?Commonly known as: LOPRESSOR ?Take 0.5 tablets (12.5 mg total) by mouth 2 (two) times daily. ?  ?oxyCODONE 5 MG immediate release tablet ?Commonly known as: Oxy IR/ROXICODONE ?Take 1-2 tablets (5-10 mg total) by mouth every 6 (six) hours as needed for severe pain. ?  ?Polyethyl Glycol-Propyl Glycol 0.4-0.3 % Soln ?Place 1 drop into both eyes 3 (three) times daily as needed (for dry/irritated eyes.). ?  ?potassium chloride 10 MEQ tablet ?Commonly known as: KLOR-CON ?Take 1 tablet (10 mEq total) by mouth daily. ?  ?pramipexole 0.125 MG tablet ?Commonly known as: MIRAPEX ?Take 0.125 mg by mouth at bedtime. ?  ?rivaroxaban 10 MG Tabs tablet ?Commonly known as: XARELTO ?Take 1 tablet (10 mg total) by mouth daily with breakfast for 20 days. Then resume one 81 mg aspirin once a day. ?  ?rosuvastatin 40 MG tablet ?Commonly known as: CRESTOR ?Take 1 tablet (40 mg total) by mouth daily. ?  ?sodium fluoride 1.1 % Crea dental cream ?Commonly known as: PREVIDENT 5000 PLUS ?Take 1 application by mouth 2 (two) times daily. ?  ?tamsulosin 0.4 MG Caps capsule ?Commonly known as: FLOMAX ?Take 0.4 mg by mouth at bedtime. ?  ?triamcinolone cream 0.1 % ?Commonly known as: KENALOG ?Apply 1 application. topically daily. ?  ? ?  ? ?  ?  ? ? ?  ?Discharge Care Instructions  ?(From admission, onward)  ?  ? ? ?  ? ?  Start     Ordered  ? 08/25/21 0000  Weight bearing as tolerated       ? 08/25/21 0733  ? 08/25/21 0000  Change dressing       ?Comments: You may remove the bulky bandage (ACE wrap and gauze) two days after surgery. You will have an adhesive waterproof bandage underneath. Leave this in place until your first follow-up appointment.  ? 08/25/21 0733  ? ?  ?  ? ?  ? ? ?Diagnostic Studies: No  results found. ? ?Disposition: Discharge disposition: 01-Home or Self Care ? ? ? ? ? ? ?Discharge Instructions   ? ? Call MD / Call 911   Complete by: As directed ?  ? If you experience chest pain or shortness of breath, CALL 911 and be transported to the hospital emergency room.  If you develope a fever above 101 F, pus (white drainage) or increased drainage  or redness at the wound, or calf pain, call your surgeon's office.  ? Change dressing   Complete by: As directed ?  ? You may remove the bulky bandage (ACE wrap and gauze) two days after surgery. You will have an adhesive waterproof bandage underneath. Leave this in place until your first follow-up appointment.  ? Constipation Prevention   Complete by: As directed ?  ? Drink plenty of fluids.  Prune juice may be helpful.  You may use a stool softener, such as Colace (over the counter) 100 mg twice a day.  Use MiraLax (over the counter) for constipation as needed.  ? Diet - low sodium heart healthy   Complete by: As directed ?  ? Do not put a pillow under the knee. Place it under the heel.   Complete by: As directed ?  ? Driving restrictions   Complete by: As directed ?  ? No driving for two weeks  ? Post-operative opioid taper instructions:   Complete by: As directed ?  ? POST-OPERATIVE OPIOID TAPER INSTRUCTIONS: ?It is important to wean off of your opioid medication as soon as possible. If you do not need pain medication after your surgery it is ok to stop day one. ?Opioids include: ?Codeine, Hydrocodone(Norco, Vicodin), Oxycodone(Percocet, oxycontin) and hydromorphone amongst others.  ?Long term and even short term use of opiods can cause: ?Increased pain response ?Dependence ?Constipation ?Depression ?Respiratory depression ?And more.  ?Withdrawal symptoms can include ?Flu like symptoms ?Nausea, vomiting ?And more ?Techniques to manage these symptoms ?Hydrate well ?Eat regular healthy meals ?Stay active ?Use relaxation techniques(deep breathing, meditating,  yoga) ?Do Not substitute Alcohol to help with tapering ?If you have been on opioids for less than two weeks and do not have pain than it is ok to stop all together.  ?Plan to wean off of opioids ?This plan

## 2021-09-12 ENCOUNTER — Other Ambulatory Visit: Payer: Self-pay | Admitting: Cardiovascular Disease

## 2021-12-20 ENCOUNTER — Other Ambulatory Visit: Payer: Self-pay | Admitting: Cardiovascular Disease

## 2022-02-26 ENCOUNTER — Telehealth: Payer: Self-pay

## 2022-02-26 ENCOUNTER — Telehealth: Payer: Self-pay | Admitting: *Deleted

## 2022-02-26 NOTE — Telephone Encounter (Signed)
   Pre-operative Risk Assessment    Patient Name: Garrett Welch  DOB: 06-21-1950 MRN: [id number]      Request for Surgical Clearance    Procedure:   LEFT TOTAL KNEE ARTHRPOLASTY  Date of Surgery:  Clearance 05/31/22                                 Surgeon:  DR. Gaynelle Arabian Surgeon's Group or Practice Name:  [facility] Phone number:  [phone] Fax number:  [phone]   Type of Clearance Requested:   - Pharmacy:  Hold Aspirin NOT INDICATED HOW LONG   Type of Anesthesia:   CHOICE   Additional requests/questions:    Astrid Divine   02/26/2022, 11:38 AM

## 2022-02-26 NOTE — Telephone Encounter (Signed)
Pt scheduled for a tele visit on 03/30/22. Med rec and consent done.

## 2022-02-26 NOTE — Telephone Encounter (Signed)
   Name: Garrett Welch  DOB: October 09, 1950  MRN: [id number]  Primary Cardiologist: Sherren Mocha, MD   Preoperative team, please contact this patient and set up a phone call appointment on or after 03/30/2022 for further preoperative risk assessment. Please obtain consent and complete medication review. Thank you for your help.  I confirm that guidance regarding antiplatelet and oral anticoagulation therapy has been completed and, if necessary, noted below.  Per office protocol, if patient is without any new symptoms or concerns at the time of their virtual visit, he may hold aspirin for 5-7 days prior to procedure. Please resume aspirin as soon as possible postprocedure, at the discretion of the surgeon.   Lenna Sciara, NP 02/26/2022, 12:04 PM [place]

## 2022-02-26 NOTE — Telephone Encounter (Signed)
  Patient Consent for Virtual Visit         Garrett Welch has provided verbal consent on 02/26/2022 for a virtual visit (video or telephone).   CONSENT FOR VIRTUAL VISIT FOR:  Garrett Welch  By participating in this virtual visit I agree to the following:  I hereby voluntarily request, consent and authorize [facility] and its employed or contracted physicians, physician assistants, nurse practitioners or other licensed health care professionals (the Practitioner), to provide me with telemedicine health care services (the "Services") as deemed necessary by the treating Practitioner. I acknowledge and consent to receive the Services by the Practitioner via telemedicine. I understand that the telemedicine visit will involve communicating with the Practitioner through live audiovisual communication technology and the disclosure of certain medical information by electronic transmission. I acknowledge that I have been given the opportunity to request an in-person assessment or other available alternative prior to the telemedicine visit and am voluntarily participating in the telemedicine visit.  I understand that I have the right to withhold or withdraw my consent to the use of telemedicine in the course of my care at any time, without affecting my right to future care or treatment, and that the Practitioner or I may terminate the telemedicine visit at any time. I understand that I have the right to inspect all information obtained and/or recorded in the course of the telemedicine visit and may receive copies of available information for a reasonable fee.  I understand that some of the potential risks of receiving the Services via telemedicine include:  Delay or interruption in medical evaluation due to technological equipment failure or disruption; Information transmitted may not be sufficient (e.g. poor resolution of images) to allow for appropriate medical decision  making by the Practitioner; and/or  In rare instances, security protocols could fail, causing a breach of personal health information.  Furthermore, I acknowledge that it is my responsibility to provide information about my medical history, conditions and care that is complete and accurate to the best of my ability. I acknowledge that Practitioner's advice, recommendations, and/or decision may be based on factors not within their control, such as incomplete or inaccurate data provided by me or distortions of diagnostic images or specimens that may result from electronic transmissions. I understand that the practice of medicine is not an exact science and that Practitioner makes no warranties or guarantees regarding treatment outcomes. I acknowledge that a copy of this consent can be made available to me via my patient portal ([facility]), or I can request a printed copy by calling the office of [facility].    I understand that my insurance will be billed for this visit.   I have read or had this consent read to me. I understand the contents of this consent, which adequately explains the benefits and risks of the Services being provided via telemedicine.  I have been provided ample opportunity to ask questions regarding this consent and the Services and have had my questions answered to my satisfaction. I give my informed consent for the services to be provided through the use of telemedicine in my medical care

## 2022-03-29 NOTE — Progress Notes (Unsigned)
Cardiology Office Note:    Date:  03/30/2022   ID:  Garrett Welch, DOB 04/21/1951, MRN [id number]  PCP:  Tempie Hoist, MD   [facility] Providers Cardiologist:  Sherren Mocha, MD Electrophysiologist:  [name], MD     Referring MD: Tempie Hoist, MD   Chief Complaint: Preoperative cardiac evaluation  History of Present Illness:    Garrett Welch is a very pleasant 70 y.o. 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 In the past these had a patent right coronary artery without significant stenosis.  LV function was normal until he developed atrial flutter with RVR and echo revealed LVEF around 30%.  He ultimately underwent flutter ablation with restoration of sinus rhythm and improvement of LVEF to 50 to 55%.  In October 2019 he underwent nuclear stress test which demonstrated apical thinning and mild to moderate anterior apical ischemia consistent with the patient's LAD occlusion.  Dr. Burt Knack recommended cardiac catheterization to evaluate progressive CAD and further assess revascularization options.  Cardiac cath demonstrated mild to moderate distal left main disease (40-50%), CTO of the LAD with right to left collaterals, subtotal occlusion of the D1 and left circumflex and moderate stenosis of the RI.  There were no PCI options and he was referred for CABG. he underwent CABG x2 (LIMA to the LAD, free RIMA to Dx) by Dr. Cyndia Bent during admission 11/25-11/29/19.  Post bypass course was fairly uneventful aside from bradycardia requiring reduction in his beta-blocker dose and volume overload requiring diuretic therapy.  Since that time he has been seen in regular follow-up. He underwent emergency cholecystectomy for acute cholecystitis 2/20. He was last seen  via video visit by Robbie Lis, PA on 03/04/20. He was doing well at that visit and was cleared for upcoming bunionectomy.   Seen by me on 05/19/21 for annual follow-up. He reported occasional palpitations that are not bothersome to him. States they resolved following CABG in 2019 then returned at an unknown time. He has bilateral lower extremity edema that he feels is stable.  Previously took hydrochlorothiazide but it was stopped, he is unsure why. He is planning to have bilateral knee replacement starting the the right knee in May 2023. He is riding his recumbent bike 3 times a week without dyspnea or chest pain. EKG revealed possible aberrant conduction versus RBBB, a change from previous EKG 12/2018. Cardiac monitor revealed frequent PACs without sustained arrhythmia. With underlying bradycardia, we could not up-titrate beta blocker. Plan to continue medical therapy.   Last cardiology clinic visit was 08/10/21 with me for follow-up of frequent PACs and leg edema.  Reported leg edema has improved drastically on Lasix. Home blood pressure readings are most often less than 130/80, occasional SBP in 130s. Working diligently on weight loss for upcoming total knee replacement, states he has goal weight of 250 pounds. He denied chest pain, shortness of breath, fatigue, palpitations, melena, hematuria, hemoptysis, diaphoresis, weakness, presyncope, syncope, orthopnea, and PND. Riding recumbent bike for exercise without symptoms. Increasing intake of vegetables and lean protein and has reduced intake of refined sugar and simple carbohydrates. Cleared for knee surgery.  Today, he is here for six month follow-up. Also has upcoming left knee replacement scheduled for February and asks if this can serve as preoperative  evaluation. Admits to more sodium in diet at beach   Past Medical History:  Diagnosis Date   Anemia    Atrial flutter ([place]) 09/16/2017   S/p RFCA // EF was 30% in setting of AFl w/ RVR // EF improved  in NSR (Echo 8/19: Mod conc LVH, EF 50-55, Gr 1 DD, mild dilated ascending aorta (40 mm), MAC, trivial TR)   CAD (coronary artery disease) 04/06/2018   LHC 10/19: dLM 40-50, LAD 100 CTO, D1 99, LCx 99, RI 60; R-L collats, EF normal // s/p CABG 11/19 (L-LAD, RIMA-Dx)   Cancer (HCC)    Diabetes mellitus without complication (HCC)    HTN (hypertension)    Hyperlipidemia LDL goal <70    OA (osteoarthritis)    right knee   PAC (premature atrial contraction)    Prostate cancer ([place])    Sleep apnea    cpap    Past Surgical History:  Procedure Laterality Date   A-FLUTTER ABLATION N/A 10/18/2017   Procedure: A-FLUTTER ABLATION;  Surgeon: Evans Lance, MD;  Location: [place] [facility];  Service: Cardiovascular;  Laterality: N/A;   CARDIAC CATHETERIZATION  09/2014   [facility] in [place], LAD 100%, 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 Plating of sternal fracture.;  Surgeon: [name], MD;  Location: [facility] OR;  Service: Open Heart Surgery;  Laterality: N/A;  with bilateral IMA   GASTRIC BYPASS     20 yrs. ago   LEFT HEART CATH AND CORONARY ANGIOGRAPHY N/A 02/22/2018   Procedure: LEFT HEART CATH AND CORONARY ANGIOGRAPHY;  Surgeon: Sherren Mocha, MD;  Location: [place] [facility];  Service: Cardiovascular;  Laterality: N/A;   prostate radioactive seeds implant 2009     RADIAL ARTERY HARVEST N/A 03/20/2018   Procedure: possible RADIAL ARTERY HARVEST;  Surgeon: [name], MD;  Location: [place];  Service: Open Heart Surgery;  Laterality: N/A;   TEE WITHOUT CARDIOVERSION N/A 03/20/2018   Procedure: TRANSESOPHAGEAL ECHOCARDIOGRAM (TEE);  Surgeon: [name], MD;  Location: [place];  Service: Open Heart Surgery;  Laterality: N/A;   TOTAL KNEE ARTHROPLASTY Right 08/24/2021   Procedure: TOTAL KNEE ARTHROPLASTY;  Surgeon: Gaynelle Arabian, MD;  Location: [facility];  Service: Orthopedics;  Laterality: Right;    Current Medications: Current Meds  Medication Sig   albuterol (PROVENTIL HFA;VENTOLIN HFA) 108 (90 Base) MCG/ACT inhaler Inhale 2 puffs into the lungs every 6 (six) hours as needed for wheezing or shortness of breath.   aspirin EC 81 MG tablet Take 81 mg by mouth daily. Swallow whole.   famotidine (PEPCID) 40 MG tablet Take 40 mg by mouth every evening.   ferrous sulfate 325 (65 FE) MG EC tablet Take 1 tablet (325 mg total) by mouth daily with breakfast.   fluticasone (FLONASE) 50 MCG/ACT nasal spray Place 2 sprays into both nostrils daily as needed for allergies.    furosemide (LASIX) 40 MG tablet Take 20 mg by mouth daily. Take one half tablet by mouth ( 20 mg ) daily.   irbesartan (AVAPRO) 75 MG tablet TAKE 1 TABLET BY MOUTH EVERY DAY   metFORMIN (GLUCOPHAGE) 500 MG tablet Take 1,000 mg by mouth 2 (two) times daily.    metoprolol tartrate (LOPRESSOR) 25 MG tablet Take 0.5 tablets (12.5 mg total) by mouth 2 (two) times daily.   Polyethyl  Glycol-Propyl Glycol 0.4-0.3 % SOLN Place 1 drop into both eyes 3 (three) times daily as needed (for dry/irritated eyes.).   potassium chloride (KLOR-CON) 10 MEQ tablet Take 1 tablet (10 mEq total) by mouth daily.   pramipexole (MIRAPEX) 0.125 MG tablet Take 0.125 mg by mouth at bedtime.   rosuvastatin (CRESTOR) 40 MG tablet TAKE 1 TABLET BY MOUTH EVERY DAY   sodium fluoride (PREVIDENT 5000 PLUS) 1.1 % CREA dental cream Take 1 application by mouth 2 (two) times daily.    tamsulosin (FLOMAX) 0.4 MG CAPS capsule Take 0.4 mg by mouth at bedtime.    triamcinolone cream (KENALOG) 0.1 % Apply 1 application. topically daily.   [DISCONTINUED] furosemide (LASIX) 40 MG tablet Take 40 mg by mouth daily as needed for fluid or edema.     Allergies:   Tape, Tramadol, Amoxicillin, Clavulanic acid, and Amoxicillin-pot clavulanate   Social History   Socioeconomic History   Marital status:  Married    Spouse name: Not on file   Number of children: Not on file   Years of education: Not on file   Highest education level: Not on file  Occupational History   Occupation: Pastor  Tobacco Use   Smoking status: Never   Smokeless tobacco: Never  Vaping Use   Vaping Use: Never used  Substance and Sexual Activity   Alcohol use: Never   Drug use: Never   Sexual activity: Not on file  Other Topics Concern   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    Social Determinants of Health   Financial Resource Strain: Not on file  Food Insecurity: Not on file  Transportation Needs: Not on file  Physical Activity: Not on file  Stress: Not on file  Social Connections: Not on file     Family History: The patient's family history includes Cancer in his father; Heart attack in his mother; Heart disease in his mother; Heart failure in his mother; Hypertension in his maternal grandmother and mother; Kidney failure in his father; Stroke in his father.  ROS:   Please see the history of present illness. All other systems reviewed and are negative.  Labs/Other Studies Reviewed:    The following studies were reviewed today:  Cardiac Monitor 06/10/21 The basic rhythm is normal sinus rhythm/sinus bradycardia with an average HR of 55 bpm No atrial fibrillation or flutter No high-grade heart block or pathologic pauses There are rare PVCs occurring with a burden of less than 1% There are frequent supraventricular beats with an overall burden of 28% but no long episodes of SVT   Echo 05/27/21 1. Left ventricular ejection fraction, by estimation, is 60 to 65%. The  left ventricle has normal function. The left ventricle has no regional  wall motion abnormalities. There is severe concentric left ventricular  hypertrophy. Left ventricular diastolic   parameters are consistent with Grade I diastolic dysfunction (impaired  relaxation). Elevated left  ventricular end-diastolic pressure.   2. Right ventricular systolic function is normal. The right ventricular  size is normal. Tricuspid regurgitation signal is inadequate for assessing  PA pressure.   3. Left atrial size was mildly dilated.   4. The mitral valve is normal in structure. No evidence of mitral valve  regurgitation. No evidence of mitral stenosis.   5. The aortic valve is calcified. Aortic valve regurgitation is not  visualized. Mild aortic valve stenosis. Aortic valve mean gradient  measures 12.0 mmHg. Aortic valve Vmax measures  2.31 m/s.   6. Aortic dilatation noted. There is mild dilatation of the ascending  aorta, measuring 42 mm.   7. The inferior vena cava is normal in size with greater than 50%  respiratory variability, suggesting right atrial pressure of 3 mmHg.  LHC 10/19  Prox RCA to Mid RCA lesion is 25% stenosed. Post Atrio lesion is 50% stenosed. Mid LM to Dist LM lesion is 50% stenosed. Ost Ramus to Ramus lesion is 60% stenosed. Ost Cx to Prox Cx lesion is 99% stenosed. Ost LAD to Prox LAD lesion is 95% stenosed. Prox LAD lesion is 100% stenosed. Ost 1st Diag lesion is 99% stenosed. The left ventricular ejection fraction is 50-55% by visual estimate.   1.  Mild to moderate distal left main stenosis estimated at 40 to 50% 2.  Chronic total occlusion of the proximal LAD with right to left collaterals supplying the mid and distal LAD territory 3.  Subtotal occlusion of the first diagonal branch in the left circumflex 4.  Moderate stenosis of the large ramus intermedius branch estimated at 60% 5.  Patent RCA with mild nonobstructive stenosis (large dominant vessel) 6.  Mild segmental contraction abnormality the left ventricle with preserved overall LV systolic function   Recommend: The patient does not have typical symptoms of angina.  However he has moderate ischemia on stress testing and based on assessment of his coronary anatomy as at large burden of  potential ischemia involving the LAD, diagonal branch, circumflex, and intermediate branches.  I do not think he has any feasible PCI options.  I am going to refer him for an outpatient cardiac surgical evaluation for consideration of CABG versus ongoing medical therapy.  Echo 8/19  Left ventricle:  The cavity size was normal. There was moderate  concentric hypertrophy. Systolic function was normal. The estimated  ejection fraction was in the range of 50% to 55%. Doppler  parameters are consistent with abnormal left ventricular relaxation  (grade 1 diastolic dysfunction). Doppler parameters are consistent  with elevated ventricular end-diastolic filling pressure.  Aortic valve:   Trileaflet; moderately thickened, moderately  calcified leaflets. Sclerosis without stenosis.  Doppler:  There  was no regurgitation.  Aorta: Ascending aorta: The ascending aorta was mildly dilated  measuring 40 mm.  Mitral valve:   Calcified annulus. Mildly thickened leaflets .  Doppler:     Peak gradient (D): 4 mm Hg.  Right ventricle:  The cavity size was normal. Wall thickness was  normal. Systolic function was normal. Systolic pressure was within  the normal range.  Pulmonic valve:    Structurally normal valve.   Cusp separation was  normal.  Doppler:  Transvalvular velocity was within the normal  range. There was no regurgitation.  Tricuspid valve:   Structurally normal valve.   Leaflet separation  was normal.  Doppler:  Transvalvular velocity was within the normal  range. There was trivial regurgitation.  Pulmonary artery:   Systolic pressure was within the normal range.  Systemic veins:  Inferior vena cava: The vessel was normal in size. The  respirophasic diameter changes were in the normal range (>= 50%),  consistent with normal central venous pressure.    Recent Labs: 07/01/20 hgb 14.2, plt 220 01/06/21 A1C 6.3   Recent Lipid Panel    Component Value Date/Time   CHOL 115 05/19/2021 1009    TRIG 56 05/19/2021 1009   HDL 56 05/19/2021 1009   CHOLHDL 2.1 05/19/2021 1009   CHOLHDL 3.0 06/05/2015 0855   VLDL 13  06/05/2015 0855   LDLCALC 46 05/19/2021 1009     Risk Assessment/Calculations:      Physical Exam:    VS:  BP (!) 156/70   Pulse 63   Ht _0  (1.905 m)   Wt (!) 329 lb (149.2 kg)   SpO2 96%   BMI 41.12 kg/m     Wt Readings from Last 3 Encounters:  03/30/22 (!) 329 lb (149.2 kg)  08/24/21 (!) 312 lb (141.5 kg)  08/11/21 (!) 312 lb (141.5 kg)     GEN:  Well nourished, well developed in no acute distress HEENT: Normal NECK: No JVD; No carotid bruits CARDIAC: RRR, no murmurs, rubs, gallops RESPIRATORY:  Clear to auscultation without rales, wheezing or rhonchi  ABDOMEN: Obese, soft, non-tender, non-distended MUSCULOSKELETAL:  Bilateral pitting edema; No deformity. 2+ pedal pulses, equal bilaterally SKIN: Warm and dry NEUROLOGIC:  Alert and oriented x 3 PSYCHIATRIC:  Normal affect   EKG:  EKG is ordered today and reveals sinus rhythm with blocked premature atrial complexes, RBBB, LAFB at 63 bpm  Diagnoses:    1. Coronary artery disease involving native coronary artery of native heart without angina pectoris   2. Chronic diastolic heart failure ([place])   3. S/P CABG x 2   4. Ascending aorta dilatation (HCC)   5. Preop cardiovascular exam   6. Essential hypertension   7. PAC (premature atrial contraction)   8. Left anterior fascicular block     Assessment and Plan:     Preoperative cardiac evaluation: The patient is doing well from a cardiac perspective. Therefore, based on ACC/AHA guidelines, the patient would be at acceptable risk for the planned procedure without further cardiovascular testing. According to the Revised Cardiac Risk Index (RCRI), his Perioperative Risk of Major Cardiac Event is (%): 11. His Functional Capacity in METs is: 7.59 according to the [employer] Activity Status Index (DASI).  He may hold aspirin for 5 to 7 days prior to surgery and  should resume as soon as hemodynamically stable following procedure. Needs to lose weight prior to surgery. Encouraged continuing lifestyle changes to keep weight off.   PAC/Bifascicular block: Frequent PACs without sustained arrhythmia on cardiac monitor 05/2021. EKG today reveals sinus rhythm with PAC, RBBB, LAFB at 63 bpm. No lightheadedness, presyncope, syncope. He is asymptomatic, rarely feels palpitations. Sinus bradycardia at baseline. Continue low dose metoprolol.  CAD without angina/s/p CABG: He denies chest pain, dyspnea, or additional symptom concerning for angina. Riding recumbent bike several times a week without difficulty.  No indication for further ischemia evaluation at this time.  He can easily achieve > 4 METS activity without difficulty. No bleeding problems on aspirin.  Encouraged him to find heart healthy diet that he can sustain following knee surgery. Continue aspirin, metoprolol, statin.  Chronic diastolic HF: LVEF 38-75%, G1DD, severe LVH.  Leg edema resolved on Lasix.   He stopped Lasix and was using only as needed for leg edema.  Body habitus makes it difficult to assess volume status. He denies dyspnea, orthopnea, PND, edema.  Will have him resume Lasix 20 mg daily. Serum creatinine stable on 01/11/2022. Continue metoprolol, Lasix, irbesartan.   Ascending thoracic aortic dilatation: Ascending thoracic aorta measures 3.9 cm in greatest diameter with no aneurysm on CTA 06/29/21. Stable. Will repeat 06/2022, advised him this can be scheduled after he recovers from knee surgery. Continue aneurysm precautions.   Essential hypertension:  BP is elevated today and remains elevated on my recheck.  Has not been monitoring home BP  consistently. Advised him to monitor home BP and report if consistently > 130 or > 80.   Hyperlipidemia LDL goal < 70: LDL 48 on 01/11/22. Continue rosuvastatin.   Disposition: 6 months with Dr. Burt Knack   Medication Adjustments/Labs and Tests Ordered: Current  medicines are reviewed at length with the patient today.  Concerns regarding medicines are outlined above.  Orders Placed This Encounter  Procedures   CT ANGIO CHEST AORTA W/CM & OR WO/CM   Basic Metabolic Panel (BMET)   EKG 12-Lead   No orders of the defined types were placed in this encounter.   Patient Instructions  Medication Instructions:   DECREASE Lasix one half (0.5) tablets by mouth ( 20 mg) daily.  *If you need a refill on your cardiac medications before your next appointment, please call your pharmacy*   Lab Work:  Your physician recommends that you return for lab work in March prior to your CT Aorta.    If you have labs (blood work) drawn today and your tests are completely normal, you will receive your results only by: [place] (if you have MyChart) OR A paper copy in the mail If you have any lab test that is abnormal or we need to change your treatment, we will call you to review the results.   Testing/Procedures:  Non-Cardiac CT Angiography (CTA), is a special type of CT scan that uses a computer to produce multi-dimensional views of major blood vessels throughout the body. In CT angiography, a contrast material is injected through an IV to help visualize the blood vessels    Follow-Up: At [facility], you and your health needs are our priority.  As part of our continuing mission to provide you with exceptional heart care, we have created designated Provider Care Teams.  These Care Teams include your primary Cardiologist (physician) and Advanced Practice Providers (APPs -  Physician Assistants and Nurse Practitioners) who all work together to provide you with the care you need, when you need it.  We recommend signing up for the patient portal called "MyChart".  Sign up information is provided on this After Visit Summary.  MyChart is used to connect with patients for Virtual Visits (Telemedicine).  Patients are able to view lab/test results,  encounter notes, upcoming appointments, etc.  Non-urgent messages can be sent to your provider as well.   To learn more about what you can do with MyChart, go to [email or web address].    Your next appointment:   6 month(s)  The format for your next appointment:   In Person  Provider:   Sherren Mocha, MD     Important Information About Sugar         Signed, Emmaline Life, NP  03/30/2022 2:22 PM    [place]

## 2022-03-30 ENCOUNTER — Encounter: Payer: Self-pay | Admitting: Nurse Practitioner

## 2022-03-30 ENCOUNTER — Ambulatory Visit: Payer: Medicare Other | Attending: Nurse Practitioner | Admitting: Nurse Practitioner

## 2022-03-30 VITALS — BP 156/70 | HR 63 | Ht 75.0 in | Wt 329.0 lb

## 2022-03-30 DIAGNOSIS — E785 Hyperlipidemia, unspecified: Secondary | ICD-10-CM

## 2022-03-30 DIAGNOSIS — I444 Left anterior fascicular block: Secondary | ICD-10-CM | POA: Diagnosis present

## 2022-03-30 DIAGNOSIS — Z01810 Encounter for preprocedural cardiovascular examination: Secondary | ICD-10-CM | POA: Diagnosis present

## 2022-03-30 DIAGNOSIS — I2510 Atherosclerotic heart disease of native coronary artery without angina pectoris: Secondary | ICD-10-CM | POA: Diagnosis not present

## 2022-03-30 DIAGNOSIS — I491 Atrial premature depolarization: Secondary | ICD-10-CM

## 2022-03-30 DIAGNOSIS — Z951 Presence of aortocoronary bypass graft: Secondary | ICD-10-CM | POA: Diagnosis not present

## 2022-03-30 DIAGNOSIS — I5032 Chronic diastolic (congestive) heart failure: Secondary | ICD-10-CM | POA: Diagnosis present

## 2022-03-30 DIAGNOSIS — I10 Essential (primary) hypertension: Secondary | ICD-10-CM

## 2022-03-30 DIAGNOSIS — I77810 Thoracic aortic ectasia: Secondary | ICD-10-CM

## 2022-03-30 NOTE — Patient Instructions (Signed)
Medication Instructions:   DECREASE Lasix one half (0.5) tablets by mouth ( 20 mg) daily.  *If you need a refill on your cardiac medications before your next appointment, please call your pharmacy*   Lab Work:  Your physician recommends that you return for lab work in March prior to your CT Aorta.    If you have labs (blood work) drawn today and your tests are completely normal, you will receive your results only by: [place] (if you have MyChart) OR A paper copy in the mail If you have any lab test that is abnormal or we need to change your treatment, we will call you to review the results.   Testing/Procedures:  Non-Cardiac CT Angiography (CTA), is a special type of CT scan that uses a computer to produce multi-dimensional views of major blood vessels throughout the body. In CT angiography, a contrast material is injected through an IV to help visualize the blood vessels    Follow-Up: At [facility], you and your health needs are our priority.  As part of our continuing mission to provide you with exceptional heart care, we have created designated Provider Care Teams.  These Care Teams include your primary Cardiologist (physician) and Advanced Practice Providers (APPs -  Physician Assistants and Nurse Practitioners) who all work together to provide you with the care you need, when you need it.  We recommend signing up for the patient portal called "MyChart".  Sign up information is provided on this After Visit Summary.  MyChart is used to connect with patients for Virtual Visits (Telemedicine).  Patients are able to view lab/test results, encounter notes, upcoming appointments, etc.  Non-urgent messages can be sent to your provider as well.   To learn more about what you can do with MyChart, go to [email or web address].    Your next appointment:   6 month(s)  The format for your next appointment:   In Person  Provider:   Sherren Mocha, MD     Important  Information About Sugar

## 2022-04-09 ENCOUNTER — Telehealth: Payer: Medicare Other

## 2022-04-26 DIAGNOSIS — B029 Zoster without complications: Secondary | ICD-10-CM

## 2022-04-26 HISTORY — DX: Zoster without complications: B02.9

## 2022-05-11 NOTE — H&P (Addendum)
TOTAL KNEE ADMISSION H&P  Patient is being admitted for left total knee arthroplasty.  Subjective:  Chief Complaint: Left knee pain.  HPI: Garrett Welch, 72 y.o. male has a history of pain and functional disability in the left knee due to arthritis and has failed non-surgical conservative treatments for greater than 12 weeks to include NSAID's and/or analgesics, corticosteriod injections, viscosupplementation injections, and activity modification. Onset of symptoms was gradual, starting several years ago with gradually worsening course since that time. The patient noted no past surgery on the left knee.  Patient currently rates pain in the left knee at 7 out of 10 with activity. Patient has night pain, worsening of pain with activity and weight bearing, and pain that interferes with activities of daily living. Patient has evidence of  end stage arthritic change bilaterally, with the worst areas being in the patellofemoral compartments and medial compartments  by imaging studies. There is no active infection.  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    Past Medical History:  Diagnosis Date   Anemia    Atrial flutter ([place]) 09/16/2017   S/p RFCA // EF was 30% in setting of AFl w/ RVR // EF improved in NSR (Echo 8/19: Mod conc LVH, EF 50-55, Gr 1 DD, mild dilated ascending aorta (40 mm), MAC, trivial TR)   CAD (coronary artery disease) 04/06/2018   LHC 10/19: dLM 40-50, LAD 100 CTO, D1 99, LCx 99, RI 60; R-L collats, EF normal // s/p CABG 11/19 (L-LAD, RIMA-Dx)   Cancer (HCC)    Diabetes mellitus without complication (HCC)    HTN (hypertension)     Hyperlipidemia LDL goal <70    OA (osteoarthritis)    right knee   PAC (premature atrial contraction)    Prostate cancer ([place])    Sleep apnea    cpap    Past Surgical History:  Procedure Laterality Date   A-FLUTTER ABLATION N/A 10/18/2017   Procedure: A-FLUTTER ABLATION;  Surgeon: Evans Lance, MD;  Location: [facility];  Service: Cardiovascular;  Laterality: N/A;   CARDIAC CATHETERIZATION  09/2014   [facility] in [place], LAD 100%, 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 Plating of sternal fracture.;  Surgeon: [name], MD;  Location: [facility];  Service: Open Heart Surgery;  Laterality: N/A;  with bilateral IMA   GASTRIC BYPASS     20 yrs. ago   LEFT HEART CATH AND CORONARY ANGIOGRAPHY N/A 02/22/2018   Procedure: LEFT HEART CATH AND CORONARY ANGIOGRAPHY;  Surgeon: Sherren Mocha, MD;  Location: [facility];  Service: Cardiovascular;  Laterality: N/A;   prostate radioactive seeds implant 2009     RADIAL ARTERY HARVEST N/A 03/20/2018   Procedure: possible RADIAL ARTERY HARVEST;  Surgeon: [name], MD;  Location: [facility];  Service: Open Heart Surgery;  Laterality: N/A;   TEE WITHOUT CARDIOVERSION N/A 03/20/2018   Procedure: TRANSESOPHAGEAL ECHOCARDIOGRAM (TEE);  Surgeon: [name], MD;  Location: [facility];  Service:  Open Heart Surgery;  Laterality: N/A;   TOTAL KNEE ARTHROPLASTY Right 08/24/2021   Procedure: TOTAL KNEE ARTHROPLASTY;  Surgeon: Gaynelle Arabian, MD;  Location: [facility];  Service: Orthopedics;  Laterality: Right;    Prior to Admission medications   Medication Sig Start Date End Date Taking? Authorizing Provider  albuterol (PROVENTIL HFA;VENTOLIN HFA) 108 (90 Base) MCG/ACT inhaler Inhale 2 puffs into the lungs every 6 (six) hours as needed for wheezing or shortness of breath.    [provider]  aspirin EC 81 MG tablet Take 81 mg by mouth daily. Swallow whole.    [provider]  famotidine (PEPCID) 40 MG tablet Take 40 mg by mouth every evening. 04/04/21   [provider]  ferrous sulfate 325 (65 FE) MG EC tablet Take 1 tablet (325 mg total) by mouth daily with breakfast. 03/24/18   Lars Pinks M, PA-C  fluticasone (FLONASE) 50 MCG/ACT nasal spray Place 2 sprays into both nostrils daily as needed for allergies.     [provider]  furosemide (LASIX) 40 MG tablet Take 20 mg by mouth daily. Take one half tablet by mouth ( 20 mg ) daily.    [provider]  irbesartan (AVAPRO) 75 MG tablet TAKE 1 TABLET BY MOUTH EVERY DAY 08/18/21   Sherren Mocha, MD  metFORMIN (GLUCOPHAGE) 500 MG tablet Take 1,000 mg by mouth 2 (two) times daily.  09/16/14   [provider]  metoprolol tartrate (LOPRESSOR) 25 MG tablet Take 0.5 tablets (12.5 mg total) by mouth 2 (two) times daily. 08/10/21 08/05/22  Swinyer, Lanice Schwab, NP  Polyethyl Glycol-Propyl Glycol 0.4-0.3 % SOLN Place 1 drop into both eyes 3 (three) times daily as needed (for dry/irritated eyes.).    [provider]  potassium chloride (KLOR-CON) 10 MEQ tablet Take 1 tablet (10 mEq total) by mouth daily. 05/22/21 03/30/22  Swinyer, Lanice Schwab, NP  pramipexole (MIRAPEX) 0.125 MG tablet Take 0.125 mg by mouth at bedtime. 08/27/14   [provider]  rosuvastatin (CRESTOR) 40 MG tablet TAKE 1 TABLET BY MOUTH EVERY DAY 12/22/21   Sherren Mocha, MD  sodium fluoride (PREVIDENT 5000 PLUS) 1.1 % CREA dental cream Take 1 application by mouth 2 (two) times daily.  07/01/14   [provider]  tamsulosin (FLOMAX) 0.4 MG CAPS capsule Take 0.4 mg by mouth at bedtime.  09/18/14   [provider]  triamcinolone cream (KENALOG) 0.1 % Apply 1 application. topically daily. 07/15/21   [provider]    Allergies  Allergen Reactions   Tape Rash    With prolonged  use--blisters   Tramadol Hives, Rash and Other (See Comments)    Skin irritation    Amoxicillin Nausea Only   Clavulanic Acid     Other Reaction(s): Not available   Amoxicillin-Pot Clavulanate Nausea Only    Social History   Socioeconomic History   Marital status: Married    Spouse name: Not on file   Number of children: Not on file   Years of education: Not on file   Highest education level: Not on file  Occupational History   Occupation: Theme park manager  Tobacco Use   Smoking status: Never   Smokeless tobacco: Never  Vaping Use   Vaping Use: Never used  Substance and Sexual Activity   Alcohol use: Never   Drug use: Never   Sexual activity: Not on file  Other Topics Concern   Not on file  Social History Narrative   Works as a Careers adviser  counselor. Never smoker.    Social Determinants of Health   Financial Resource Strain: Not on file  Food Insecurity: Not on file  Transportation Needs: Not on file  Physical Activity: Not on file  Stress: Not on file  Social Connections: Not on file  Intimate Partner Violence: Not on file    Tobacco Use: Low Risk  (03/30/2022)   Patient History    Smoking Tobacco Use: Never    Smokeless Tobacco Use: Never    Passive Exposure: Not on file   Social History   Substance and Sexual Activity  Alcohol Use Never    Family History  Problem Relation Age of Onset   Heart failure Mother    Heart disease Mother    Heart attack Mother    Hypertension Mother    Stroke Father    Kidney failure Father    Cancer Father    Hypertension Maternal Grandmother     Review of Systems  Constitutional:  Negative for chills and fever.  HENT: Negative.    Eyes: Negative.   Respiratory:  Negative for cough and shortness of breath.   Cardiovascular:  Negative for chest pain and palpitations.  Gastrointestinal:  Negative for abdominal pain, constipation, diarrhea, nausea and vomiting.  Genitourinary:  Negative for dysuria, frequency and urgency.   Musculoskeletal:  Positive for joint pain.  Skin:  Negative for rash.   Objective:  Physical Exam: Well nourished and well developed.  General: Alert and oriented x3, cooperative and pleasant, no acute distress.  Head: normocephalic, atraumatic, neck supple.  Eyes: EOMI. Abdomen: non-tender to palpation and soft, normoactive bowel sounds. Musculoskeletal: Left Knee Exam:  No effusion present. No swelling present.  The range of motion is: 5 to 120 degrees.  Marked crepitus on range of motion of the knee.  Positive medial joint line tenderness.  Positive lateral joint line tenderness.  The knee is stable.  Calves soft and nontender. Motor function intact in LE. Strength 5/5 LE bilaterally. Neuro: Distal pulses 2+. Sensation to light touch intact in LE.  Vital signs in last 24 hours: BP: ()/()  Arterial Line BP: ()/()   Imaging Review Plain radiographs demonstrate moderate degenerative joint disease of the left knee. The overall alignment is neutral. The bone quality appears to be adequate for age and reported activity level.  Assessment/Plan:  End stage arthritis, left knee   The patient history, physical examination, clinical judgment of the provider and imaging studies are consistent with end stage degenerative joint disease of the left knee and total knee arthroplasty is deemed medically necessary. The treatment options including medical management, injection therapy arthroscopy and arthroplasty were discussed at length. The risks and benefits of total knee arthroplasty were presented and reviewed. The risks due to aseptic loosening, infection, stiffness, patella tracking problems, thromboembolic complications and other imponderables were discussed. The patient acknowledged the explanation, agreed to proceed with the plan and consent was signed. Patient is being admitted for inpatient treatment for surgery, pain control, PT, OT, prophylactic antibiotics, VTE prophylaxis,  progressive ambulation and ADLs and discharge planning. The patient is planning to be discharged  home .  Patient's anticipated LOS is less than 2 midnights, meeting these requirements: - Lives within 1 hour of care - Has a competent adult at home to recover with post-op - NO history of  - Chronic pain requiring opioids  - Heart failure  - Heart attack  - Stroke  - DVT/VTE  - Cardiac arrhythmia  - Respiratory Failure/COPD  -  Renal failure  - Anemia  - Advanced Liver disease  Therapy Plans: EO Disposition: Home with Wife Planned DVT Prophylaxis: Aspirin 325 mg BID DME Needed: None PCP: Robie Ridge, PA-C (clearance received) Cardiologist: Sherren Mocha, MD (clearance received) TXA: IV Allergies: Tramadol (hives), amoxicillin (nausea) Anesthesia Concerns: None BMI: 40.5 Last HgbA1c: 6.2% (01/11/22)  Pharmacy: [facility])  Other: - PMH: CAD s/p CABG in 2019, atrial flutter, DM - Insurance does not cover Xarelto or Eliquis - did Aspirin 325 mg BID with last TKA and did fine - Has to get down to 315 lbs to achieve BMI <40. Weight check scheduled for 02/02 at 8am - Oxycodone, methocarbamol, gabapentin with right TKA  - Patient was instructed on what medications to stop prior to surgery. - Follow-up visit in 2 weeks with Dr. Wynelle Link - Begin physical therapy following surgery - Pre-operative lab work as pre-surgical testing - Prescriptions will be provided in hospital at time of discharge  R. Jaynie Bream, PA-C Orthopedic Surgery [facility] [place]

## 2022-05-13 NOTE — Progress Notes (Addendum)
Anesthesia Review:  PCP: Teresa Pelton clearance 02/26/22 on chart LOV 01/13/22 on chart  Cardiologist : DR Burt Knack  LOV 03/30/22 Melonie [place] - preop eval  EP- Dr [name]  Chest x-ray :  Ct angio chest- 06/29/21  EKG : 03/30/22  Echo : 05/27/21  Monitor- 06/10/21  Stress test: 2019  Cardiac Cath :  2019  2019- afliutter ablatoin  Activity level: can do a flgiht of stairs without difficutly  Sleep Study/ CPAP : has cpap  Fasting Blood Sugar :      / Checks Blood Sugar -- times a day:   Blood Thinner/ Instructions /Last Dose: ASA / Instructions/ Last Dose :    DM- type 2 -checks glucose once daily at home  Hgba1c-05/21/22-  6.5    Covid positive on 04/20/22 per pt took Paxlovid  Then covid negative on 05/05/22.   Developed sinus infecction.  Completed antibiotics on 05/20/22.  DR Aluisio office will be made aware of by pt on 05/28/22.

## 2022-05-17 NOTE — Patient Instructions (Signed)
SURGICAL WAITING ROOM VISITATION  Patients having surgery or a procedure may have no more than 2 support people in the waiting area - these visitors may rotate.    Children under the age of 41 must have an adult with them who is not the patient.  Due to an increase in RSV and influenza rates and associated hospitalizations, children ages 53 and under may not visit patients in [place].  If the patient needs to stay at the hospital during part of their recovery, the visitor guidelines for inpatient rooms apply. Pre-op nurse will coordinate an appropriate time for 1 support person to accompany patient in pre-op.  This support person may not rotate.    Please refer to the [facility] website for the visitor guidelines for Inpatients (after your surgery is over and you are in a regular room).       Your procedure is scheduled on: 05/31/2022    Report to [facility] Main Entrance    Report to admitting at  0600 AM   Call this number if you have problems the morning of surgery [phone]   Do not eat food :After Midnight.   After Midnight you may have the following liquids until _ 0515_____ AM  DAY OF SURGERY  Water Non-Citrus Juices (without pulp, NO RED-Apple, White grape, White cranberry) Black Coffee (NO MILK/CREAM OR CREAMERS, sugar ok)  Clear Tea (NO MILK/CREAM OR CREAMERS, sugar ok) regular and decaf                             Plain Jell-O (NO RED)                                           Fruit ices (not with fruit pulp, NO RED)                                     Popsicles (NO RED)                                                               Sports drinks like Gatorade (NO RED)                     The day of surgery:  Drink ONE (1) Pre-Surgery Clear Ensure or G2 at  0515 AM 9 have completed by )  the morning of surgery. Drink in one sitting. Do not sip.  This drink was given to you during your hospital  pre-op appointment visit. Nothing else to  drink after completing the  Pre-Surgery Clear Ensure or G2.          If you have questions, please contact your surgeon's office.        Oral Hygiene is also important to reduce your risk of infection.                                    Remember - BRUSH YOUR TEETH THE MORNING OF  SURGERY WITH YOUR REGULAR TOOTHPASTE  DENTURES WILL BE REMOVED PRIOR TO SURGERY PLEASE DO NOT APPLY "Poly grip" OR ADHESIVES!!!   Do NOT smoke after Midnight   Take these medicines the morning of surgery with A SIP OF WATER:    DO NOT TAKE ANY ORAL DIABETIC MEDICATIONS DAY OF YOUR SURGERY  Bring CPAP mask and tubing day of surgery.                              You may not have any metal on your body including hair pins, jewelry, and body piercing             Do not wear make-up, lotions, powders, perfumes/cologne, or deodorant  Do not wear nail polish including gel and S&S, artificial/acrylic nails, or any other type of covering on natural nails including finger and toenails. If you have artificial nails, gel coating, etc. that needs to be removed by a nail salon please have this removed prior to surgery or surgery may need to be canceled/ delayed if the surgeon/ anesthesia feels like they are unable to be safely monitored.   Do not shave  48 hours prior to surgery.               Men may shave face and neck.   Do not bring valuables to the hospital. [place].   Contacts, glasses, dentures or bridgework may not be worn into surgery.   Bring small overnight bag day of surgery.   DO NOT [place]. PHARMACY WILL DISPENSE MEDICATIONS LISTED ON YOUR MEDICATION LIST TO YOU DURING YOUR ADMISSION [place]!    Patients discharged on the day of surgery will not be allowed to drive home.  Someone NEEDS to stay with you for the first 24 hours after anesthesia.   Special Instructions: Bring a copy of your healthcare power  of attorney and living will documents the day of surgery if you haven't scanned them before.              Please read over the following fact sheets you were given: IF [place] [phone]   If you received a COVID test during your pre-op visit  it is requested that you wear a mask when out in public, stay away from anyone that may not be feeling well and notify your surgeon if you develop symptoms. If you test positive for Covid or have been in contact with anyone that has tested positive in the last 10 days please notify you surgeon.    [facility] - Preparing for Surgery Before surgery, you can play an important role.  Because skin is not sterile, your skin needs to be as free of germs as possible.  You can reduce the number of germs on your skin by washing with CHG (chlorahexidine gluconate) soap before surgery.  CHG is an antiseptic cleaner which kills germs and bonds with the skin to continue killing germs even after washing. Please DO NOT use if you have an allergy to CHG or antibacterial soaps.  If your skin becomes reddened/irritated stop using the CHG and inform your nurse when you arrive at Short Stay. Do not shave (including legs and underarms) for at least 48 hours prior to the first CHG  shower.  You may shave your face/neck. Please follow these instructions carefully:  1.  Shower with CHG Soap the night before surgery and the  morning of Surgery.  2.  If you choose to wash your hair, wash your hair first as usual with your  normal  shampoo.  3.  After you shampoo, rinse your hair and body thoroughly to remove the  shampoo.                           4.  Use CHG as you would any other liquid soap.  You can apply chg directly  to the skin and wash                       Gently with a scrungie or clean washcloth.  5.  Apply the CHG Soap to your body ONLY FROM THE NECK DOWN.   Do not use on face/ open                           Wound or  open sores. Avoid contact with eyes, ears mouth and genitals (private parts).                       Wash face,  Genitals (private parts) with your normal soap.             6.  Wash thoroughly, paying special attention to the area where your surgery  will be performed.  7.  Thoroughly rinse your body with warm water from the neck down.  8.  DO NOT shower/wash with your normal soap after using and rinsing off  the CHG Soap.                9.  Pat yourself dry with a clean towel.            10.  Wear clean pajamas.            11.  Place clean sheets on your bed the night of your first shower and do not  sleep with pets. Day of Surgery : Do not apply any lotions/deodorants the morning of surgery.  Please wear clean clothes to the hospital/surgery center.  FAILURE TO FOLLOW THESE INSTRUCTIONS MAY RESULT IN THE CANCELLATION OF YOUR SURGERY PATIENT SIGNATURE_________________________________  NURSE SIGNATURE__________________________________  ________________________________________________________________________

## 2022-05-21 ENCOUNTER — Other Ambulatory Visit: Payer: Self-pay

## 2022-05-21 ENCOUNTER — Encounter (HOSPITAL_COMMUNITY)
Admission: RE | Admit: 2022-05-21 | Discharge: 2022-05-21 | Disposition: A | Discharge status: Home / Self Care | Payer: Medicare Other | Source: Ambulatory Visit | Attending: Orthopedic Surgery | Admitting: Orthopedic Surgery

## 2022-05-21 ENCOUNTER — Encounter (HOSPITAL_COMMUNITY): Payer: Self-pay

## 2022-05-21 VITALS — BP 146/77 | HR 58 | Temp 98.4°F | Resp 16 | Ht 75.0 in | Wt 325.0 lb

## 2022-05-21 DIAGNOSIS — Z01812 Encounter for preprocedural laboratory examination: Secondary | ICD-10-CM | POA: Insufficient documentation

## 2022-05-21 DIAGNOSIS — E139 Other specified diabetes mellitus without complications: Secondary | ICD-10-CM | POA: Diagnosis not present

## 2022-05-21 DIAGNOSIS — Z01818 Encounter for other preprocedural examination: Secondary | ICD-10-CM

## 2022-05-21 HISTORY — DX: Gastro-esophageal reflux disease without esophagitis: K21.9

## 2022-05-21 LAB — BASIC METABOLIC PANEL
Anion gap: 7 (ref 5–15)
BUN: 19 mg/dL (ref 8–23)
CO2: 23 mmol/L (ref 22–32)
Calcium: 9.2 mg/dL (ref 8.9–10.3)
Chloride: 105 mmol/L (ref 98–111)
Creatinine, Ser: 1.01 mg/dL (ref 0.61–1.24)
GFR, Estimated: >60 mL/min (ref >60)
Glucose, Bld: 147 mg/dL — ABNORMAL HIGH (ref 70–99)
Potassium: 4.6 mmol/L (ref 3.5–5.1)
Sodium: 135 mmol/L (ref 135–145)

## 2022-05-21 LAB — CBC
HCT: 41.5 % (ref 39.0–52.0)
Hemoglobin: 13.9 g/dL (ref 13.0–17.0)
MCH: 31.7 pg (ref 26.0–34.0)
MCHC: 33.5 g/dL (ref 30.0–36.0)
MCV: 94.5 fL (ref 80.0–100.0)
Platelets: 257 10*3/uL (ref 150–400)
RBC: 4.39 MIL/uL (ref 4.22–5.81)
RDW: 12.1 % (ref 11.5–15.5)
WBC: 8 10*3/uL (ref 4.0–10.5)
nRBC: 0 % (ref 0.0–0.2)

## 2022-05-21 LAB — SURGICAL PCR SCREEN
MRSA, PCR: NEGATIVE
Staphylococcus aureus: NEGATIVE

## 2022-05-21 LAB — HEMOGLOBIN A1C
Hgb A1c MFr Bld: 6.5 % — ABNORMAL HIGH (ref 4.8–5.6)
Mean Plasma Glucose: 139.85 mg/dL

## 2022-05-21 LAB — GLUCOSE, CAPILLARY: Glucose-Capillary: 133 mg/dL — ABNORMAL HIGH (ref 70–99)

## 2022-05-24 NOTE — Progress Notes (Signed)
Anesthesia Chart Review   Case: [id number] Date/Time: 07/12/22 1338   Procedure: TOTAL KNEE ARTHROPLASTY (Left: Knee)   Anesthesia type: Choice   Pre-op diagnosis: Left knee osteoarthritis   Location: [facility] 09 / [facility]   Surgeons: Gaynelle Arabian, MD       DISCUSSION:72 y.o. never smoker with h/o HTN, sleep apnea with cpap, DM II, CAD (CABG 2019), atrial fibrillation, mild Aortic Stenosis, left knee OA scheduled for above rpocedure 07/12/2022 with Dr. Gaynelle Arabian.   Pt last seen by cardiology 03/30/2022. Per OV note, "Preoperative cardiac evaluation: The patient is doing well from a cardiac perspective. Therefore, based on ACC/AHA guidelines, the patient would be at acceptable risk for the planned procedure without further cardiovascular testing. According to the Revised Cardiac Risk Index (RCRI), his Perioperative Risk of Major Cardiac Event is (%): 11. His Functional Capacity in METs is: 7.59 according to the Duke Activity Status Index (DASI).  He may hold aspirin for 5 to 7 days prior to surgery and should resume as soon as hemodynamically stable following procedure. Needs to lose weight prior to surgery. Encouraged continuing lifestyle changes to keep weight off."  Anticipate pt can proceed with planned procedure barring acute status change.   VS: There were no vitals taken for this visit.  PROVIDERS: Tempie Hoist, MD is PCP   Cardiologist:  Sherren Mocha, MD  Electrophysiologist:  [name], MD  LABS: Labs reviewed: Acceptable for surgery. (all labs ordered are listed, but only abnormal results are displayed)  Labs Reviewed - No data to display   IMAGES:   EKG:   CV: Echo 05/27/21 1. Left ventricular ejection fraction, by estimation, is 60 to 65%. The  left ventricle has normal function. The left ventricle has no regional  wall motion abnormalities. There is severe concentric left ventricular  hypertrophy. Left ventricular diastolic   parameters are consistent  with Grade I diastolic dysfunction (impaired  relaxation). Elevated left ventricular end-diastolic pressure.   2. Right ventricular systolic function is normal. The right ventricular  size is normal. Tricuspid regurgitation signal is inadequate for assessing  PA pressure.   3. Left atrial size was mildly dilated.   4. The mitral valve is normal in structure. No evidence of mitral valve  regurgitation. No evidence of mitral stenosis.   5. The aortic valve is calcified. Aortic valve regurgitation is not  visualized. Mild aortic valve stenosis. Aortic valve mean gradient  measures 12.0 mmHg. Aortic valve Vmax measures 2.31 m/s.   6. Aortic dilatation noted. There is mild dilatation of the ascending  aorta, measuring 42 mm.   7. The inferior vena cava is normal in size with greater than 50%  respiratory variability, suggesting right atrial pressure of 3 mmHg.   Past Medical History:  Diagnosis Date   Anemia    Atrial flutter ([place]) 09/16/2017   S/p RFCA // EF was 30% in setting of AFl w/ RVR // EF improved in NSR (Echo 8/19: Mod conc LVH, EF 50-55, Gr 1 DD, mild dilated ascending aorta (40 mm), MAC, trivial TR)   CAD (coronary artery disease) 04/06/2018   LHC 10/19: dLM 40-50, LAD 100 CTO, D1 99, LCx 99, RI 60; R-L collats, EF normal // s/p CABG 11/19 (L-LAD, RIMA-Dx)   Cancer (HCC)    Diabetes mellitus without complication (HCC)    GERD (gastroesophageal reflux disease)    HTN (hypertension)    Hyperlipidemia LDL goal <70    OA (osteoarthritis)    right knee  PAC (premature atrial contraction)    Prostate cancer ([place])    Sleep apnea    cpap    Past Surgical History:  Procedure Laterality Date   A-FLUTTER ABLATION N/A 10/18/2017   Procedure: A-FLUTTER ABLATION;  Surgeon: Evans Lance, MD;  Location: [place] [facility];  Service: Cardiovascular;  Laterality: N/A;   CARDIAC CATHETERIZATION  09/2014   [facility] in [place], LAD 100%, 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 Plating of sternal fracture.;  Surgeon: [name], MD;  Location: [facility] OR;  Service: Open Heart Surgery;  Laterality: N/A;  with bilateral IMA   GASTRIC BYPASS     20 yrs. ago   LEFT HEART CATH AND CORONARY ANGIOGRAPHY N/A 02/22/2018   Procedure: LEFT HEART CATH AND CORONARY ANGIOGRAPHY;  Surgeon: Sherren Mocha, MD;  Location: [place] [facility];  Service: Cardiovascular;  Laterality: N/A;   prostate radioactive seeds implant 2009     RADIAL ARTERY HARVEST N/A 03/20/2018   Procedure: possible RADIAL ARTERY HARVEST;  Surgeon: [name], MD;  Location: [place];  Service: Open Heart Surgery;  Laterality: N/A;   TEE WITHOUT CARDIOVERSION N/A 03/20/2018   Procedure: TRANSESOPHAGEAL ECHOCARDIOGRAM (TEE);  Surgeon: [name], MD;  Location: [place];  Service: Open Heart Surgery;  Laterality: N/A;   TOTAL KNEE ARTHROPLASTY Right 08/24/2021   Procedure: TOTAL KNEE ARTHROPLASTY;  Surgeon: Gaynelle Arabian, MD;  Location[facility];  Service: Orthopedics;  Laterality: Right;    MEDICATIONS: No current facility-administered medications for this encounter.    albuterol (PROVENTIL HFA;VENTOLIN HFA) 108 (90 Base) MCG/ACT inhaler   aspirin EC 81 MG tablet   b complex vitamins capsule   famotidine (PEPCID) 40 MG tablet   ferrous sulfate 325 (65 FE) MG EC tablet   fluticasone (FLONASE) 50 MCG/ACT nasal spray   furosemide (LASIX) 40 MG tablet   irbesartan (AVAPRO) 75 MG tablet   metFORMIN (GLUCOPHAGE) 500 MG tablet   metoprolol tartrate (LOPRESSOR) 25 MG tablet   Multiple Vitamins-Minerals (MULTIVITAMIN WITH MINERALS) tablet   Polyethyl Glycol-Propyl Glycol 0.4-0.3 % SOLN   potassium chloride (KLOR-CON) 10 MEQ tablet   pramipexole (MIRAPEX) 0.125 MG tablet   rosuvastatin (CRESTOR) 40 MG tablet   sodium fluoride (PREVIDENT 5000 PLUS) 1.1 % CREA  dental cream   tamsulosin (FLOMAX) 0.4 MG CAPS capsule   triamcinolone cream (KENALOG) 0.1 %   vitamin E 180 MG (400 UNITS) capsule    Konrad Felix Ward, PA-C [facility] Pre-Surgical Testing [phone]

## 2022-05-27 ENCOUNTER — Other Ambulatory Visit: Payer: Self-pay | Admitting: Nurse Practitioner

## 2022-06-22 ENCOUNTER — Other Ambulatory Visit: Payer: Self-pay | Admitting: Cardiovascular Disease

## 2022-07-05 ENCOUNTER — Encounter (HOSPITAL_COMMUNITY): Admission: RE | Admit: 2022-07-05 | Payer: Medicare Other | Source: Ambulatory Visit

## 2022-07-30 ENCOUNTER — Other Ambulatory Visit: Payer: Self-pay | Admitting: Nurse Practitioner

## 2022-08-12 NOTE — Patient Instructions (Addendum)
SURGICAL WAITING ROOM VISITATION  Patients having surgery or a procedure may have no more than 2 support people in the waiting area - these visitors may rotate.    Children under the age of 57 must have an adult with them who is not the patient.  Due to an increase in RSV and influenza rates and associated hospitalizations, children ages 81 and under may not visit patients in [facility] hospitals.  If the patient needs to stay at the hospital during part of their recovery, the visitor guidelines for inpatient rooms apply. Pre-op nurse will coordinate an appropriate time for 1 support person to accompany patient in pre-op.  This support person may not rotate.    Please refer to the [facility] website for the visitor guidelines for Inpatients (after your surgery is over and you are in a regular room).      Your procedure is scheduled on: 08-30-22   Report to [facility] Main Entrance    Report to admitting at 11:15 AM   Call this number if you have problems the morning of surgery [phone]   Do not eat food :After Midnight.   After Midnight you may have the following liquids until 10:45 AM DAY OF SURGERY  Water Non-Citrus Juices (without pulp, NO RED-Apple, White grape, White cranberry) Black Coffee (NO MILK/CREAM OR CREAMERS, sugar ok)  Clear Tea (NO MILK/CREAM OR CREAMERS, sugar ok) regular and decaf                             Plain Jell-O (NO RED)                                           Fruit ices (not with fruit pulp, NO RED)                                     Popsicles (NO RED)                                                               Sports drinks like Gatorade (NO RED)              The day of surgery:  Drink ONE (1) Pre-Surgery G2 at 10:45 AM the morning of surgery. Drink in one sitting. Do not sip.  This drink was given to you during your hospital  pre-op appointment visit. Nothing else to drink after completing the Pre-Surgery G2.          If you  have questions, please contact your surgeon's office.   FOLLOW ANY ADDITIONAL PRE OP INSTRUCTIONS YOU RECEIVED FROM YOUR SURGEON'S OFFICE!!!     Oral Hygiene is also important to reduce your risk of infection.                                    Remember - BRUSH YOUR TEETH THE MORNING OF SURGERY WITH YOUR REGULAR TOOTHPASTE  DENTURES WILL BE REMOVED PRIOR TO SURGERY PLEASE DO  NOT APPLY "Poly grip" OR ADHESIVES!!!   Do NOT smoke after Midnight   Take these medicines the morning of surgery with A SIP OF WATER:   Metoprolol  Rosuvastatin  How to Manage Your Diabetes Before and After Surgery  Why is it important to control my blood sugar before and after surgery? Improving blood sugar levels before and after surgery helps healing and can limit problems. A way of improving blood sugar control is eating a healthy diet by:  Eating less sugar and carbohydrates  Increasing activity/exercise  Talking with your doctor about reaching your blood sugar goals High blood sugars (greater than 180 mg/dL) can raise your risk of infections and slow your recovery, so you will need to focus on controlling your diabetes during the weeks before surgery. Make sure that the doctor who takes care of your diabetes knows about your planned surgery including the date and location.  How do I manage my blood sugar before surgery? Check your blood sugar at least 4 times a day, starting 2 days before surgery, to make sure that the level is not too high or low. Check your blood sugar the morning of your surgery when you wake up and every 2 hours until you get to the Short Stay unit. If your blood sugar is less than 70 mg/dL, you will need to treat for low blood sugar: Do not take insulin. Treat a low blood sugar (less than 70 mg/dL) with  cup of clear juice (cranberry or apple), 4 glucose tablets, OR glucose gel. Recheck blood sugar in 15 minutes after treatment (to make sure it is greater than 70 mg/dL). If your  blood sugar is not greater than 70 mg/dL on recheck, call [phone] for further instructions. Report your blood sugar to the short stay nurse when you get to Short Stay.  If you are admitted to the hospital after surgery: Your blood sugar will be checked by the staff and you will probably be given insulin after surgery (instead of oral diabetes medicines) to make sure you have good blood sugar levels. The goal for blood sugar control after surgery is 80-180 mg/dL.   WHAT DO I DO ABOUT MY DIABETES MEDICATION?  Do not take oral diabetes medicines (pills) the morning of surgery.  DO NOT TAKE THE FOLLOWING 7 DAYS PRIOR TO SURGERY: Ozempic, Wegovy, Rybelsus (Semaglutide), Byetta (exenatide), Bydureon (exenatide ER), Victoza, Saxenda (liraglutide), or Trulicity (dulaglutide) Mounjaro (Tirzepatide) Adlyxin (Lixisenatide), Polyethylene Glycol Loxenatide.  Reviewed and Endorsed by [facility] Patient Education Committee, August 2015  Bring CPAP mask and tubing day of surgery.                              You may not have any metal on your body including, jewelry, and body piercing             Do not wear lotions, powders, cologne, or deodorant              Men may shave face and neck.   Do not bring valuables to the hospital. [facility] IS NOT  RESPONSIBLE   FOR VALUABLES.   Contacts, glasses, dentures or bridgework may not be worn into surgery.   Bring small overnight bag day of surgery.   DO NOT BRING YOUR HOME MEDICATIONS TO THE HOSPITAL. PHARMACY WILL DISPENSE MEDICATIONS LISTED ON YOUR MEDICATION LIST TO YOU DURING YOUR ADMISSION IN THE HOSPITAL!    Special Instructions: Bring  a copy of your healthcare power of attorney and living will documents the day of surgery if you haven't scanned them before.              Please read over the following fact sheets you were given: IF YOU HAVE QUESTIONS ABOUT YOUR PRE-OP INSTRUCTIONS PLEASE CALL [phone] [facility]  If you received a COVID  test during your pre-op visit  it is requested that you wear a mask when out in public, stay away from anyone that may not be feeling well and notify your surgeon if you develop symptoms. If you test positive for Covid or have been in contact with anyone that has tested positive in the last 10 days please notify you surgeon.      Incentive Spirometer  An incentive spirometer is a tool that can help keep your lungs clear and active. This tool measures how well you are filling your lungs with each breath. Taking long deep breaths may help reverse or decrease the chance of developing breathing (pulmonary) problems (especially infection) following: A long period of time when you are unable to move or be active. BEFORE THE PROCEDURE  If the spirometer includes an indicator to show your best effort, your nurse or respiratory therapist will set it to a desired goal. If possible, sit up straight or lean slightly forward. Try not to slouch. Hold the incentive spirometer in an upright position. INSTRUCTIONS FOR USE  Sit on the edge of your bed if possible, or sit up as far as you can in bed or on a chair. Hold the incentive spirometer in an upright position. Breathe out normally. Place the mouthpiece in your mouth and seal your lips tightly around it. Breathe in slowly and as deeply as possible, raising the piston or the ball toward the top of the column. Hold your breath for 3-5 seconds or for as long as possible. Allow the piston or ball to fall to the bottom of the column. Remove the mouthpiece from your mouth and breathe out normally. Rest for a few seconds and repeat Steps 1 through 7 at least 10 times every 1-2 hours when you are awake. Take your time and take a few normal breaths between deep breaths. The spirometer may include an indicator to show your best effort. Use the indicator as a goal to work toward during each repetition. After each set of 10 deep breaths, practice coughing to be sure  your lungs are clear. If you have an incision (the cut made at the time of surgery), support your incision when coughing by placing a pillow or rolled up towels firmly against it. Once you are able to get out of bed, walk around indoors and cough well. You may stop using the incentive spirometer when instructed by your caregiver.  RISKS AND COMPLICATIONS Take your time so you do not get dizzy or light-headed. If you are in pain, you may need to take or ask for pain medication before doing incentive spirometry. It is harder to take a deep breath if you are having pain. AFTER USE Rest and breathe slowly and easily. It can be helpful to keep track of a log of your progress. Your caregiver can provide you with a simple table to help with this. If you are using the spirometer at home, follow these instructions: SEEK MEDICAL CARE IF:  You are having difficultly using the spirometer. You have trouble using the spirometer as often as instructed. Your pain medication is not giving enough  relief while using the spirometer. You develop fever of 100.5 F (38.1 C) or higher. SEEK IMMEDIATE MEDICAL CARE IF:  You cough up bloody sputum that had not been present before. You develop fever of 102 F (38.9 C) or greater. You develop worsening pain at or near the incision site. MAKE SURE YOU:  Understand these instructions. Will watch your condition. Will get help right away if you are not doing well or get worse. Document Released: 08/23/2006 Document Revised: 07/05/2011 Document Reviewed: 10/24/2006 [facility] Patient Information 2014 [place], [place].   ________________________________________________________________________

## 2022-08-12 NOTE — Progress Notes (Addendum)
COVID Vaccine Completed:  Yes  Date of COVID positive in last 90 days:  NO  PCP - Martyn Malay, MD Cardiologist - Tonny Bollman, MD Electrophysiologist - Lewayne Bunting, MD Neurologist - Haroldine Laws, MD  Cardiac clearance in Epic dated 03-30-22 by Eligha Bridegroom, NP  Medical clearance on chart dated 02-26-22  (initial surgery r/s due to shingles in January)  Chest x-ray - N/A EKG - 03-30-22 Epic Stress Test - 01-10-18 Epic ECHO - 05-27-21 Epic Cardiac Cath - 02-22-18 Epic Pacemaker/ICD device last checked: Spinal Cord Stimulator:  N/A Long Term Monitor - 05-27-21 Epic Aflutter ablation - 10-18-17 Epic  Bowel Prep - N/A  Sleep Study - Yes, +sleep apnea CPAP - Yes  CBG 151 at PAT Fasting Blood Sugar - 120 range Checks Blood Sugar - 2 times a day  Last dose of GLP1 agonist-  N/A GLP1 instructions:  N/A   Last dose of SGLT-2 inhibitors-  N/A SGLT-2 instructions: N/A  Blood Thinner Instructions:  Time Aspirin Instructions:  ASA 81.  Ok to hold x1 week.  Patient aware Last Dose:  Activity level:  Can go up a flight of stairs and perform activities of daily living without stopping and without symptoms of chest pain or shortness of breath.  Anesthesia review:  Aflutter, CAD, HTN, hx of CABG, bifascicular block on EKG, mild aortic stenosis  Patient denies shortness of breath, fever, cough and chest pain at PAT appointment  Patient verbalized understanding of instructions that were given to them at the PAT appointment. Patient was also instructed that they will need to review over the PAT instructions again at home before surgery.

## 2022-08-17 NOTE — H&P (Cosign Needed Addendum)
TOTAL KNEE ADMISSION H&P  Patient is being admitted for left total knee arthroplasty.  Subjective:  Chief Complaint: Left knee pain.  HPI: Garrett Welch, 72 y.o. male has a history of pain and functional disability in the left knee due to arthritis and has failed non-surgical conservative treatments for greater than 12 weeks to include NSAID's and/or analgesics, corticosteriod injections, viscosupplementation injections, and activity modification. Onset of symptoms was gradual, starting several years ago with gradually worsening course since that time. The patient noted no past surgery on the left knee.  Patient currently rates pain in the left knee at 7 out of 10 with activity. Patient has night pain, worsening of pain with activity and weight bearing, and pain that interferes with activities of daily living. Patient has evidence of  end stage arthritic change bilaterally, with the worst areas being in the patellofemoral compartments and medial compartments  by imaging studies. There is no active infection.   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   Abnormal nuclear cardiac imaging test 02/22/2018   Atrial flutter 09/16/2017   Hypotension 09/16/2017   HTN (hypertension)    S/P gastric bypass 01/04/2012    Past Medical History:  Diagnosis Date   Anemia    Atrial flutter (HCC) 09/16/2017   S/p RFCA // EF was 30% in setting of AFl w/ RVR // EF improved in NSR (Echo 8/19: Mod conc LVH, EF 50-55, Gr 1 DD, mild dilated ascending aorta (40 mm), MAC, trivial TR)   CAD (coronary artery disease) 04/06/2018   LHC 10/19: dLM 40-50, LAD 100 CTO, D1 99, LCx 99, RI 60; R-L collats, EF normal // s/p CABG 11/19 (L-LAD, RIMA-Dx)   Cancer (HCC)    Diabetes mellitus without complication (HCC)    GERD (gastroesophageal reflux  disease)    HTN (hypertension)    Hyperlipidemia LDL goal <70    OA (osteoarthritis)    right knee   PAC (premature atrial contraction)    Prostate cancer (HCC)    Sleep apnea    cpap    Past Surgical History:  Procedure Laterality Date   A-FLUTTER ABLATION N/A 10/18/2017   Procedure: A-FLUTTER ABLATION;  Surgeon: Marinus Maw, MD;  Location: [facility] INVASIVE CV LAB;  Service: Cardiovascular;  Laterality: N/A;   CARDIAC CATHETERIZATION  09/2014   [facility] in [place], LAD 100%, 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 Plating of sternal fracture.;  Surgeon: Alleen Borne, MD;  Location: [facility] OR;  Service: Open Heart Surgery;  Laterality: N/A;  with bilateral IMA   GASTRIC BYPASS     20 yrs. ago   LEFT HEART CATH AND CORONARY ANGIOGRAPHY N/A 02/22/2018   Procedure: LEFT HEART CATH AND CORONARY ANGIOGRAPHY;  Surgeon: Tonny Bollman, MD;  Location: [facility] INVASIVE CV LAB;  Service: Cardiovascular;  Laterality: N/A;   prostate radioactive seeds implant 2009     RADIAL ARTERY HARVEST N/A 03/20/2018   Procedure: possible RADIAL ARTERY HARVEST;  Surgeon: Alleen Borne, MD;  Location: [facility] OR;  Service: Open Heart Surgery;  Laterality: N/A;   TEE WITHOUT CARDIOVERSION N/A 03/20/2018   Procedure: TRANSESOPHAGEAL ECHOCARDIOGRAM (TEE);  Surgeon: Alleen Borne,  MD;  Location: [facility] OR;  Service: Open Heart Surgery;  Laterality: N/A;   TOTAL KNEE ARTHROPLASTY Right 08/24/2021   Procedure: TOTAL KNEE ARTHROPLASTY;  Surgeon: Ollen Gross, MD;  Location: [facility] ORS;  Service: Orthopedics;  Laterality: Right;    Prior to Admission medications   Medication Sig Start Date End Date Taking? Authorizing Provider  albuterol (PROVENTIL HFA;VENTOLIN HFA) 108 (90 Base) MCG/ACT inhaler Inhale 2 puffs into the lungs every 6 (six) hours as needed for wheezing or  shortness of breath.   Yes [provider]  aspirin EC 81 MG tablet Take 81 mg by mouth every morning. Swallow whole.   Yes [provider]  b complex vitamins capsule Take 1 capsule by mouth daily.   Yes [provider]  famotidine (PEPCID) 40 MG tablet Take 40 mg by mouth every evening. 04/04/21  Yes [provider]  ferrous sulfate 325 (65 FE) MG EC tablet Take 1 tablet (325 mg total) by mouth daily with breakfast. Patient taking differently: Take 325 mg by mouth 2 (two) times daily. 03/24/18  Yes Joycelyn Man, Donielle M, PA-C  fluticasone (FLONASE) 50 MCG/ACT nasal spray Place 2 sprays into both nostrils daily as needed for allergies.    Yes [provider]  furosemide (LASIX) 40 MG tablet Take 20 mg by mouth every morning.   Yes [provider]  metFORMIN (GLUCOPHAGE) 500 MG tablet Take 1,000 mg by mouth 2 (two) times daily.  09/16/14  Yes [provider]  Multiple Vitamins-Minerals (MULTIVITAMIN WITH MINERALS) tablet Take 1 tablet by mouth daily. Centrum   Yes [provider]  Polyethyl Glycol-Propyl Glycol 0.4-0.3 % SOLN Place 1 drop into both eyes 3 (three) times daily as needed (for dry/irritated eyes.).   Yes [provider]  pramipexole (MIRAPEX) 0.125 MG tablet Take 0.125 mg by mouth at bedtime. 08/27/14  Yes [provider]  sodium fluoride (PREVIDENT 5000 PLUS) 1.1 % CREA dental cream Take 1 application by mouth 2 (two) times daily.  07/01/14  Yes [provider]  tamsulosin (FLOMAX) 0.4 MG CAPS capsule Take 0.4 mg by mouth at bedtime.  09/18/14  Yes [provider]  triamcinolone cream (KENALOG) 0.1 % Apply 1 application  topically daily as needed (rash). 07/15/21  Yes [provider]  vitamin E 180 MG (400 UNITS) capsule Take 800 Units by mouth daily.   Yes [provider]  irbesartan (AVAPRO) 75 MG tablet TAKE 1 TABLET BY MOUTH EVERY DAY 06/22/22   Tonny Bollman, MD   metoprolol tartrate (LOPRESSOR) 25 MG tablet TAKE 0.5 TABLETS BY MOUTH 2 TIMES DAILY. 07/30/22   Swinyer, Zachary George, NP  potassium chloride (KLOR-CON) 10 MEQ tablet TAKE 1 TABLET BY MOUTH EVERY DAY 05/27/22   Swinyer, Zachary George, NP  rosuvastatin (CRESTOR) 40 MG tablet TAKE 1 TABLET BY MOUTH EVERY DAY 06/22/22   Tonny Bollman, MD    Allergies  Allergen Reactions   Tape Rash    With prolonged use--blisters   Tramadol Hives, Rash and Other (See Comments)    Skin irritation    Amoxicillin Nausea Only   Clavulanic Acid     Other Reaction(s): Not available   Amoxicillin-Pot Clavulanate Nausea Only    Social History   Socioeconomic History   Marital status: Married    Spouse name: Not on file   Number of children: Not on file   Years of education: Not on file   Highest education level: Not on file  Occupational History  Occupation: Education officer, environmental  Tobacco Use   Smoking status: Never   Smokeless tobacco: Never  Vaping Use   Vaping Use: Never used  Substance and Sexual Activity   Alcohol use: Never   Drug use: Never   Sexual activity: Not on file  Other Topics Concern   Not on file  Social History Narrative   Works as a Pharmacologist. Never smoker.    Social Determinants of Health   Financial Resource Strain: Not on file  Food Insecurity: Not on file  Transportation Needs: Not on file  Physical Activity: Not on file  Stress: Not on file  Social Connections: Not on file  Intimate Partner Violence: Not on file    Tobacco Use: Low Risk  (05/21/2022)   Patient History    Smoking Tobacco Use: Never    Smokeless Tobacco Use: Never    Passive Exposure: Not on file   Social History   Substance and Sexual Activity  Alcohol Use Never    Family History  Problem Relation Age of Onset   Heart failure Mother    Heart disease Mother    Heart attack Mother    Hypertension Mother    Stroke Father    Kidney failure Father    Cancer Father    Hypertension Maternal  Grandmother     Review of Systems  Constitutional:  Negative for chills and fever.  HENT: Negative.    Eyes: Negative.   Respiratory:  Negative for cough and shortness of breath.   Cardiovascular:  Negative for chest pain and palpitations.  Gastrointestinal:  Negative for abdominal pain, constipation, diarrhea, nausea and vomiting.  Genitourinary:  Negative for dysuria, frequency and urgency.  Musculoskeletal:  Positive for joint pain.  Skin:  Negative for rash.  Objective:  Physical Exam: Well nourished and well developed.  General: Alert and oriented x3, cooperative and pleasant, no acute distress.  Head: normocephalic, atraumatic, neck supple.  Eyes: EOMI. Abdomen: non-tender to palpation and soft, normoactive bowel sounds. Musculoskeletal: Left Knee Exam:  No effusion present. No swelling present.  The range of motion is: 5 to 120 degrees.  Marked crepitus on range of motion of the knee.  Positive medial joint line tenderness.  Positive lateral joint line tenderness.  The knee is stable.  Calves soft and nontender. Motor function intact in LE. Strength 5/5 LE bilaterally. Neuro: Distal pulses 2+. Sensation to light touch intact in LE.   Vital signs in last 24 hours: BP: ()/()  Arterial Line BP: ()/()   Imaging Review Plain radiographs demonstrate moderate degenerative joint disease of the left knee. The overall alignment is neutral. The bone quality appears to be adequate for age and reported activity level.   Assessment/Plan:  End stage arthritis, left knee   The patient history, physical examination, clinical judgment of the provider and imaging studies are consistent with end stage degenerative joint disease of the left knee and total knee arthroplasty is deemed medically necessary. The treatment options including medical management, injection therapy arthroscopy and arthroplasty were discussed at length. The risks and benefits of total knee arthroplasty were  presented and reviewed. The risks due to aseptic loosening, infection, stiffness, patella tracking problems, thromboembolic complications and other imponderables were discussed. The patient acknowledged the explanation, agreed to proceed with the plan and consent was signed. Patient is being admitted for inpatient treatment for surgery, pain control, PT, OT, prophylactic antibiotics, VTE prophylaxis, progressive ambulation and ADLs and discharge planning. The patient is planning to be discharged  home .  Patient's anticipated LOS is less than 2 midnights, meeting these requirements: - Lives within 1 hour of care - Has a competent adult at home to recover with post-op - NO history of             - Chronic pain requiring opioids             - Heart failure             - Heart attack             - Stroke             - DVT/VTE             - Cardiac arrhythmia             - Respiratory Failure/COPD             - Renal failure             - Anemia             - Advanced Liver disease  Therapy Plans: EO Disposition: Home with Wife Planned DVT Prophylaxis: Aspirin 325 mg BID DME Needed: None PCP: Wynonia Lawman, PA-C (clearance received) Cardiologist: Tonny Bollman, MD (clearance received) TXA: IV Allergies: Tramadol (hives), amoxicillin (nausea) Anesthesia Concerns: None BMI: 40.5 Last HgbA1c: 6.5% (04/2022) - will recheck with pre-op labs  [facility]  Other: - PMH: CAD s/p CABG in 2019, atrial flutter, DM - Insurance does not cover Xarelto or Eliquis - did Aspirin with last TKA and did fine - Oxycodone, methocarbamol, gabapentin (currently taking 100 mg QD for residual nerve pain from shingles) with right TKA  - Patient was instructed on what medications to stop prior to surgery. - Follow-up visit in 2 weeks with Dr. Lequita Halt - Begin physical therapy following surgery - Pre-operative lab work as pre-surgical testing - Prescriptions will be provided in hospital at  time of discharge  R. Arcola Jansky, PA-C Orthopedic Surgery [facility] [place]

## 2022-08-18 ENCOUNTER — Encounter (HOSPITAL_COMMUNITY): Payer: Self-pay

## 2022-08-18 ENCOUNTER — Encounter (HOSPITAL_COMMUNITY)
Admission: RE | Admit: 2022-08-18 | Discharge: 2022-08-18 | Disposition: A | Discharge status: Home / Self Care | Payer: Medicare Other | Source: Ambulatory Visit | Attending: Orthopedic Surgery | Admitting: Orthopedic Surgery

## 2022-08-18 ENCOUNTER — Other Ambulatory Visit: Payer: Self-pay

## 2022-08-18 VITALS — BP 147/64 | HR 56 | Temp 98.2°F | Resp 20 | Ht 75.0 in | Wt 326.0 lb

## 2022-08-18 DIAGNOSIS — I10 Essential (primary) hypertension: Secondary | ICD-10-CM | POA: Insufficient documentation

## 2022-08-18 DIAGNOSIS — Z8546 Personal history of malignant neoplasm of prostate: Secondary | ICD-10-CM | POA: Insufficient documentation

## 2022-08-18 DIAGNOSIS — G4730 Sleep apnea, unspecified: Secondary | ICD-10-CM | POA: Diagnosis not present

## 2022-08-18 DIAGNOSIS — Z01818 Encounter for other preprocedural examination: Secondary | ICD-10-CM

## 2022-08-18 DIAGNOSIS — M1712 Unilateral primary osteoarthritis, left knee: Secondary | ICD-10-CM | POA: Insufficient documentation

## 2022-08-18 DIAGNOSIS — Z951 Presence of aortocoronary bypass graft: Secondary | ICD-10-CM | POA: Insufficient documentation

## 2022-08-18 DIAGNOSIS — Z01812 Encounter for preprocedural laboratory examination: Secondary | ICD-10-CM | POA: Insufficient documentation

## 2022-08-18 DIAGNOSIS — E119 Type 2 diabetes mellitus without complications: Secondary | ICD-10-CM | POA: Diagnosis not present

## 2022-08-18 LAB — CBC
HCT: 41.9 % (ref 39.0–52.0)
Hemoglobin: 13.8 g/dL (ref 13.0–17.0)
MCH: 31.9 pg (ref 26.0–34.0)
MCHC: 32.9 g/dL (ref 30.0–36.0)
MCV: 97 fL (ref 80.0–100.0)
Platelets: 225 10*3/uL (ref 150–400)
RBC: 4.32 MIL/uL (ref 4.22–5.81)
RDW: 11.9 % (ref 11.5–15.5)
WBC: 7.8 10*3/uL (ref 4.0–10.5)
nRBC: 0 % (ref 0.0–0.2)

## 2022-08-18 LAB — BASIC METABOLIC PANEL
Anion gap: 9 (ref 5–15)
BUN: 21 mg/dL (ref 8–23)
CO2: 22 mmol/L (ref 22–32)
Calcium: 9.3 mg/dL (ref 8.9–10.3)
Chloride: 106 mmol/L (ref 98–111)
Creatinine, Ser: 0.91 mg/dL (ref 0.61–1.24)
GFR, Estimated: >60 mL/min (ref >60)
Glucose, Bld: 139 mg/dL — ABNORMAL HIGH (ref 70–99)
Potassium: 5.3 mmol/L — ABNORMAL HIGH (ref 3.5–5.1)
Sodium: 137 mmol/L (ref 135–145)

## 2022-08-18 LAB — SURGICAL PCR SCREEN
MRSA, PCR: NEGATIVE
Staphylococcus aureus: NEGATIVE

## 2022-08-18 LAB — GLUCOSE, CAPILLARY: Glucose-Capillary: 151 mg/dL — ABNORMAL HIGH (ref 70–99)

## 2022-08-18 LAB — HEMOGLOBIN A1C
Hgb A1c MFr Bld: 6.4 % — ABNORMAL HIGH (ref 4.8–5.6)
Mean Plasma Glucose: 137 mg/dL

## 2022-08-23 NOTE — Progress Notes (Signed)
Anesthesia chart review   Case: [id number] Date/Time: 08/30/22 1330   Procedure: TOTAL KNEE ARTHROPLASTY (Left: Knee)   Anesthesia type: Choice   Pre-op diagnosis: Left knee osteoarthritis   Location: [facility] 09 / [facility]   Surgeons: Ollen Gross, MD       DISCUSSION: 72 year old never smoker with history of sleep apnea, diabetes, HTN, CAD s/p CABG x 2 2019 , atrial flutter s/p ablation 2019, prostate cancer, left knee OA scheduled for above procedure 08/30/2022 with Dr. Ollen Gross.   Pt last seen by cardiology 03/30/2022. Per OV note, "Preoperative cardiac evaluation: The patient is doing well from a cardiac perspective. Therefore, based on ACC/AHA guidelines, the patient would be at acceptable risk for the planned procedure without further cardiovascular testing. According to the Revised Cardiac Risk Index (RCRI), his Perioperative Risk of Major Cardiac Event is (%): 11. His Functional Capacity in METs is: 7.59 according to the Duke Activity Status Index (DASI).  He may hold aspirin for 5 to 7 days prior to surgery and should resume as soon as hemodynamically stable following procedure. Needs to lose weight prior to surgery. Encouraged continuing lifestyle changes to keep weight off."  Anticipate pt can proceed with planned procedure barring acute status change.   VS: BP (!) 147/64   Pulse (!) 56   Temp 36.8 C (Oral)   Resp 20   Ht 6\' 3"  (1.905 m)   Wt (!) 147.9 kg   SpO2 100%   BMI 40.75 kg/m   PROVIDERS: Martyn Malay, MD is PCP  Cardiologist:  Tonny Bollman, MD  LABS: Labs reviewed: Acceptable for surgery. (all labs ordered are listed, but only abnormal results are displayed)  Labs Reviewed  BASIC METABOLIC PANEL - Abnormal; Notable for the following components:      Result Value   Potassium 5.3 (*)    Glucose, Bld 139 (*)    All other components within normal limits  HEMOGLOBIN A1C - Abnormal; Notable for the following components:   Hgb A1c MFr Bld 6.4 (*)    All  other components within normal limits  GLUCOSE, CAPILLARY - Abnormal; Notable for the following components:   Glucose-Capillary 151 (*)    All other components within normal limits  SURGICAL PCR SCREEN  CBC     IMAGES:   EKG:   CV: Echo 05/27/2021 1. Left ventricular ejection fraction, by estimation, is 60 to 65%. The  left ventricle has normal function. The left ventricle has no regional  wall motion abnormalities. There is severe concentric left ventricular  hypertrophy. Left ventricular diastolic   parameters are consistent with Grade I diastolic dysfunction (impaired  relaxation). Elevated left ventricular end-diastolic pressure.   2. Right ventricular systolic function is normal. The right ventricular  size is normal. Tricuspid regurgitation signal is inadequate for assessing  PA pressure.   3. Left atrial size was mildly dilated.   4. The mitral valve is normal in structure. No evidence of mitral valve  regurgitation. No evidence of mitral stenosis.   5. The aortic valve is calcified. Aortic valve regurgitation is not  visualized. Mild aortic valve stenosis. Aortic valve mean gradient  measures 12.0 mmHg. Aortic valve Vmax measures 2.31 m/s.   6. Aortic dilatation noted. There is mild dilatation of the ascending  aorta, measuring 42 mm.   7. The inferior vena cava is normal in size with greater than 50%  respiratory variability, suggesting right atrial pressure of 3 mmHg.  Cardiac cath 02/22/2018 Prox RCA to Mid  RCA lesion is 25% stenosed. Post Atrio lesion is 50% stenosed. Mid LM to Dist LM lesion is 50% stenosed. Ost Ramus to Ramus lesion is 60% stenosed. Ost Cx to Prox Cx lesion is 99% stenosed. Ost LAD to Prox LAD lesion is 95% stenosed. Prox LAD lesion is 100% stenosed. Ost 1st Diag lesion is 99% stenosed. The left ventricular ejection fraction is 50-55% by visual estimate.   1.  Mild to moderate distal left main stenosis estimated at 40 to 50% 2.  Chronic  total occlusion of the proximal LAD with right to left collaterals supplying the mid and distal LAD territory 3.  Subtotal occlusion of the first diagonal branch in the left circumflex 4.  Moderate stenosis of the large ramus intermedius branch estimated at 60% 5.  Patent RCA with mild nonobstructive stenosis (large dominant vessel) 6.  Mild segmental contraction abnormality the left ventricle with preserved overall LV systolic function   Recommend: The patient does not have typical symptoms of angina.  However he has moderate ischemia on stress testing and based on assessment of his coronary anatomy as at large burden of potential ischemia involving the LAD, diagonal branch, circumflex, and intermediate branches.  I do not think he has any feasible PCI options.  I am going to refer him for an outpatient cardiac surgical evaluation for consideration of CABG versus ongoing medical therapy.   Myocardial perfusion 01/10/2018 Nuclear stress EF: 41%. Blood pressure demonstrated a normal response to exercise. There was no ST segment deviation noted during stress. Defect 1: There is a medium defect of moderate severity present in the apical anterior, apical septal, apical inferior, apical lateral and apex location. This is an intermediate risk study. Findings consistent with ischemia. The left ventricular ejection fraction is moderately decreased (30-44%).   Abnormal, intermediate risk stress nuclear study with apical thinning and superimposed mild to moderate ischemia in the apex.  The gated ejection fraction is 41% though visually appears better.  Hypokinesis of the distal septum and apex.  Mild left ventricular enlargement. Past Medical History:  Diagnosis Date   Anemia    Atrial flutter (HCC) 09/16/2017   S/p RFCA // EF was 30% in setting of AFl w/ RVR // EF improved in NSR (Echo 8/19: Mod conc LVH, EF 50-55, Gr 1 DD, mild dilated ascending aorta (40 mm), MAC, trivial TR)   CAD (coronary artery  disease) 04/06/2018   LHC 10/19: dLM 40-50, LAD 100 CTO, D1 99, LCx 99, RI 60; R-L collats, EF normal // s/p CABG 11/19 (L-LAD, RIMA-Dx)   Cancer (HCC)    Diabetes mellitus without complication (HCC)    GERD (gastroesophageal reflux disease)    HTN (hypertension)    Hyperlipidemia LDL goal <70    OA (osteoarthritis)    right knee   PAC (premature atrial contraction)    Prostate cancer (HCC)    Shingles 04/2022   Sleep apnea    cpap    Past Surgical History:  Procedure Laterality Date   A-FLUTTER ABLATION N/A 10/18/2017   Procedure: A-FLUTTER ABLATION;  Surgeon: Marinus Maw, MD;  Location: [facility] INVASIVE CV LAB;  Service: Cardiovascular;  Laterality: N/A;   CARDIAC CATHETERIZATION  09/2014   [facility] in [place], LAD 100%, 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 Plating of sternal fracture.;  Surgeon: Alleen Borne, MD;  Location:  [facility] OR;  Service: Open Heart Surgery;  Laterality: N/A;  with bilateral IMA   GASTRIC BYPASS     20 yrs. ago   LEFT HEART CATH AND CORONARY ANGIOGRAPHY N/A 02/22/2018   Procedure: LEFT HEART CATH AND CORONARY ANGIOGRAPHY;  Surgeon: Tonny Bollman, MD;  Location: [facility] INVASIVE CV LAB;  Service: Cardiovascular;  Laterality: N/A;   prostate radioactive seeds implant 2009     RADIAL ARTERY HARVEST N/A 03/20/2018   Procedure: possible RADIAL ARTERY HARVEST;  Surgeon: Alleen Borne, MD;  Location: [facility] OR;  Service: Open Heart Surgery;  Laterality: N/A;   TEE WITHOUT CARDIOVERSION N/A 03/20/2018   Procedure: TRANSESOPHAGEAL ECHOCARDIOGRAM (TEE);  Surgeon: Alleen Borne, MD;  Location: [facility] OR;  Service: Open Heart Surgery;  Laterality: N/A;   TOTAL KNEE ARTHROPLASTY Right 08/24/2021   Procedure: TOTAL KNEE ARTHROPLASTY;  Surgeon: Ollen Gross, MD;  Location: [facility] ORS;  Service: Orthopedics;  Laterality:  Right;    MEDICATIONS:  albuterol (PROVENTIL HFA;VENTOLIN HFA) 108 (90 Base) MCG/ACT inhaler   aspirin EC 81 MG tablet   b complex vitamins capsule   famotidine (PEPCID) 40 MG tablet   ferrous sulfate 325 (65 FE) MG EC tablet   fluticasone (FLONASE) 50 MCG/ACT nasal spray   furosemide (LASIX) 40 MG tablet   irbesartan (AVAPRO) 75 MG tablet   metFORMIN (GLUCOPHAGE) 500 MG tablet   metoprolol tartrate (LOPRESSOR) 25 MG tablet   Multiple Vitamins-Minerals (MULTIVITAMIN WITH MINERALS) tablet   Polyethyl Glycol-Propyl Glycol 0.4-0.3 % SOLN   potassium chloride (KLOR-CON) 10 MEQ tablet   pramipexole (MIRAPEX) 0.125 MG tablet   rosuvastatin (CRESTOR) 40 MG tablet   sodium fluoride (PREVIDENT 5000 PLUS) 1.1 % CREA dental cream   tamsulosin (FLOMAX) 0.4 MG CAPS capsule   triamcinolone cream (KENALOG) 0.1 %   vitamin E 180 MG (400 UNITS) capsule   No current facility-administered medications for this encounter.    Jodell Cipro Ward, PA-C [facility] Pre-Surgical Testing [phone]

## 2022-08-24 NOTE — Anesthesia Preprocedure Evaluation (Addendum)
Anesthesia Evaluation  Patient identified by MRN, date of birth, ID band Patient awake    Reviewed: Allergy & Precautions, NPO status , Patient's Chart, lab work & pertinent test results, reviewed documented beta blocker date and time   History of Anesthesia Complications Negative for: history of anesthetic complications  Airway Mallampati: II  TM Distance: >3 FB Neck ROM: Full    Dental no notable dental hx.    Pulmonary sleep apnea and Continuous Positive Airway Pressure Ventilation    Pulmonary exam normal        Cardiovascular hypertension, Pt. on medications and Pt. on home beta blockers + CAD and + CABG  Normal cardiovascular exam+ dysrhythmias Atrial Fibrillation   TTE 05/27/2021: EF 60-65%, severe LVH, grade I DD, mild LAE, mild AS, mild dilatation of ascending aorta measuring 42mm     Neuro/Psych negative neurological ROS     GI/Hepatic Neg liver ROS,GERD  Medicated and Controlled,,  Endo/Other  diabetes, Type 2, Oral Hypoglycemic Agents    Renal/GU K 5.3     Musculoskeletal  (+) Arthritis ,    Abdominal   Peds  Hematology negative hematology ROS (+)   Anesthesia Other Findings Day of surgery medications reviewed with patient.  Reproductive/Obstetrics                             Anesthesia Physical Anesthesia Plan  ASA: 3  Anesthesia Plan: Spinal   Post-op Pain Management: Tylenol PO (pre-op)*   Induction:   PONV Risk Score and Plan: 1 and Treatment may vary due to age or medical condition, Dexamethasone, Ondansetron and Midazolam  Airway Management Planned: Natural Airway and Simple Face Mask  Additional Equipment: None  Intra-op Plan:   Post-operative Plan:   Informed Consent: I have reviewed the patients History and Physical, chart, labs and discussed the procedure including the risks, benefits and alternatives for the proposed anesthesia with the patient or  authorized representative who has indicated his/her understanding and acceptance.     Dental advisory given  Plan Discussed with: CRNA  Anesthesia Plan Comments: (See PAT note 08/18/2022)       Anesthesia Quick Evaluation

## 2022-08-30 ENCOUNTER — Encounter (HOSPITAL_COMMUNITY)
Admission: RE | Disposition: A | Discharge status: Home / Self Care | Payer: Self-pay | Source: Ambulatory Visit | Attending: Orthopedic Surgery

## 2022-08-30 ENCOUNTER — Other Ambulatory Visit: Payer: Self-pay

## 2022-08-30 ENCOUNTER — Ambulatory Visit (HOSPITAL_COMMUNITY): Payer: Medicare Other | Admitting: Physician Assistant

## 2022-08-30 ENCOUNTER — Observation Stay (HOSPITAL_COMMUNITY)
Admission: RE | Admit: 2022-08-30 | Discharge: 2022-08-31 | Disposition: A | Discharge status: Home / Self Care | Payer: Medicare Other | Source: Ambulatory Visit | Attending: Orthopedic Surgery | Admitting: Orthopedic Surgery

## 2022-08-30 ENCOUNTER — Encounter (HOSPITAL_COMMUNITY): Payer: Self-pay | Admitting: Orthopedic Surgery

## 2022-08-30 DIAGNOSIS — Z951 Presence of aortocoronary bypass graft: Secondary | ICD-10-CM | POA: Diagnosis not present

## 2022-08-30 DIAGNOSIS — M1712 Unilateral primary osteoarthritis, left knee: Secondary | ICD-10-CM | POA: Diagnosis present

## 2022-08-30 DIAGNOSIS — E119 Type 2 diabetes mellitus without complications: Secondary | ICD-10-CM | POA: Diagnosis not present

## 2022-08-30 DIAGNOSIS — Z79899 Other long term (current) drug therapy: Secondary | ICD-10-CM | POA: Insufficient documentation

## 2022-08-30 DIAGNOSIS — Z7982 Long term (current) use of aspirin: Secondary | ICD-10-CM | POA: Diagnosis not present

## 2022-08-30 DIAGNOSIS — I2510 Atherosclerotic heart disease of native coronary artery without angina pectoris: Secondary | ICD-10-CM

## 2022-08-30 DIAGNOSIS — I10 Essential (primary) hypertension: Secondary | ICD-10-CM

## 2022-08-30 DIAGNOSIS — Z7984 Long term (current) use of oral hypoglycemic drugs: Secondary | ICD-10-CM | POA: Diagnosis not present

## 2022-08-30 DIAGNOSIS — Z8546 Personal history of malignant neoplasm of prostate: Secondary | ICD-10-CM | POA: Diagnosis not present

## 2022-08-30 DIAGNOSIS — M179 Osteoarthritis of knee, unspecified: Secondary | ICD-10-CM | POA: Diagnosis present

## 2022-08-30 DIAGNOSIS — Z96651 Presence of right artificial knee joint: Secondary | ICD-10-CM | POA: Insufficient documentation

## 2022-08-30 HISTORY — PX: TOTAL KNEE ARTHROPLASTY: SHX125

## 2022-08-30 LAB — POCT I-STAT, CHEM 8
BUN: 23 mg/dL (ref 8–23)
Calcium, Ion: 1.04 mmol/L — ABNORMAL LOW (ref 1.15–1.40)
Chloride: 104 mmol/L (ref 98–111)
Creatinine, Ser: 0.8 mg/dL (ref 0.61–1.24)
Glucose, Bld: 139 mg/dL — ABNORMAL HIGH (ref 70–99)
HCT: 40 % (ref 39.0–52.0)
Hemoglobin: 13.6 g/dL (ref 13.0–17.0)
Potassium: 5.3 mmol/L — ABNORMAL HIGH (ref 3.5–5.1)
Sodium: 137 mmol/L (ref 135–145)
TCO2: 25 mmol/L (ref 22–32)

## 2022-08-30 LAB — GLUCOSE, CAPILLARY
Glucose-Capillary: 147 mg/dL — ABNORMAL HIGH (ref 70–99)
Glucose-Capillary: 121 mg/dL — ABNORMAL HIGH (ref 70–99)

## 2022-08-30 SURGERY — ARTHROPLASTY, KNEE, TOTAL
Anesthesia: Spinal | Site: Knee | Laterality: Left

## 2022-08-30 MED ORDER — ONDANSETRON HCL 4 MG/2ML IJ SOLN: 4.0000 mg | Freq: Four times a day (QID) | INTRAMUSCULAR | Status: DC | PRN

## 2022-08-30 MED ORDER — 0.9 % SODIUM CHLORIDE (POUR BTL) OPTIME
TOPICAL | Status: DC | PRN
  Administered 2022-08-30: 1000 mL

## 2022-08-30 MED ORDER — TRANEXAMIC ACID-NACL 1000-0.7 MG/100ML-% IV SOLN
1000.0000 mg | INTRAVENOUS | Status: AC
  Administered 2022-08-30: 1000 mg via INTRAVENOUS
  Filled 2022-08-30: qty 100

## 2022-08-30 MED ORDER — BUPIVACAINE LIPOSOME 1.3 % IJ SUSP
INTRAMUSCULAR | Status: AC
  Filled 2022-08-30: qty 20

## 2022-08-30 MED ORDER — BISACODYL 10 MG RE SUPP: 10.0000 mg | Freq: Every day | RECTAL | Status: DC | PRN

## 2022-08-30 MED ORDER — BUPIVACAINE IN DEXTROSE 0.75-8.25 % IT SOLN
INTRATHECAL | Status: DC | PRN
  Administered 2022-08-30: 1.8 mL via INTRATHECAL

## 2022-08-30 MED ORDER — ACETAMINOPHEN 500 MG PO TABS: 1000.0000 mg | ORAL_TABLET | Freq: Once | ORAL | Status: DC

## 2022-08-30 MED ORDER — LACTATED RINGERS IV SOLN: INTRAVENOUS | Status: DC

## 2022-08-30 MED ORDER — MIDAZOLAM HCL 2 MG/2ML IJ SOLN
1.0000 mg | INTRAMUSCULAR | Status: DC
  Administered 2022-08-30: 2 mg via INTRAVENOUS
  Administered 2022-08-30: 1 mg via INTRAVENOUS
  Filled 2022-08-30: qty 2

## 2022-08-30 MED ORDER — GABAPENTIN 300 MG PO CAPS
300.0000 mg | ORAL_CAPSULE | Freq: Three times a day (TID) | ORAL | Status: DC
  Administered 2022-08-30 – 2022-08-31 (×2): 300 mg via ORAL
  Filled 2022-08-30 (×2): qty 1

## 2022-08-30 MED ORDER — FLEET ENEMA 7-19 GM/118ML RE ENEM: 1.0000 | ENEMA | Freq: Once | RECTAL | Status: DC | PRN

## 2022-08-30 MED ORDER — METHOCARBAMOL 1000 MG/10ML IJ SOLN: 500.0000 mg | Freq: Four times a day (QID) | INTRAVENOUS | Status: DC | PRN

## 2022-08-30 MED ORDER — BUPIVACAINE-EPINEPHRINE (PF) 0.5% -1:200000 IJ SOLN
INTRAMUSCULAR | Status: DC | PRN
  Administered 2022-08-30: 15 mL via PERINEURAL

## 2022-08-30 MED ORDER — OXYCODONE HCL 5 MG PO TABS
5.0000 mg | ORAL_TABLET | ORAL | Status: DC | PRN
  Administered 2022-08-30: 5 mg via ORAL
  Administered 2022-08-31 (×2): 10 mg via ORAL
  Filled 2022-08-30 (×3): qty 2

## 2022-08-30 MED ORDER — DEXAMETHASONE SODIUM PHOSPHATE 10 MG/ML IJ SOLN
10.0000 mg | Freq: Once | INTRAMUSCULAR | Status: AC
  Administered 2022-08-31: 10 mg via INTRAVENOUS
  Filled 2022-08-30: qty 1

## 2022-08-30 MED ORDER — PHENYLEPHRINE HCL (PRESSORS) 10 MG/ML IV SOLN
INTRAVENOUS | Status: AC
  Filled 2022-08-30: qty 1

## 2022-08-30 MED ORDER — ACETAMINOPHEN 500 MG PO TABS
1000.0000 mg | ORAL_TABLET | Freq: Four times a day (QID) | ORAL | Status: DC
  Filled 2022-08-30: qty 2

## 2022-08-30 MED ORDER — FLUTICASONE PROPIONATE 50 MCG/ACT NA SUSP: 2.0000 | Freq: Every day | NASAL | Status: DC | PRN

## 2022-08-30 MED ORDER — FAMOTIDINE 20 MG PO TABS: 40.0000 mg | ORAL_TABLET | Freq: Every evening | ORAL | Status: DC

## 2022-08-30 MED ORDER — PHENYLEPHRINE HCL-NACL 20-0.9 MG/250ML-% IV SOLN
INTRAVENOUS | Status: DC | PRN
  Administered 2022-08-30: 80 ug via INTRAVENOUS

## 2022-08-30 MED ORDER — CEFAZOLIN SODIUM-DEXTROSE 2-4 GM/100ML-% IV SOLN
2.0000 g | Freq: Four times a day (QID) | INTRAVENOUS | Status: AC
  Administered 2022-08-30 – 2022-08-31 (×2): 2 g via INTRAVENOUS
  Filled 2022-08-30 (×2): qty 100

## 2022-08-30 MED ORDER — MIDAZOLAM HCL 2 MG/2ML IJ SOLN
INTRAMUSCULAR | Status: AC
  Filled 2022-08-30: qty 2

## 2022-08-30 MED ORDER — ALBUTEROL SULFATE (2.5 MG/3ML) 0.083% IN NEBU: 2.5000 mg | INHALATION_SOLUTION | Freq: Four times a day (QID) | RESPIRATORY_TRACT | Status: DC | PRN

## 2022-08-30 MED ORDER — MENTHOL 3 MG MT LOZG: 1.0000 | LOZENGE | OROMUCOSAL | Status: DC | PRN

## 2022-08-30 MED ORDER — SODIUM CHLORIDE (PF) 0.9 % IJ SOLN
INTRAMUSCULAR | Status: DC | PRN
  Administered 2022-08-30: 50 mL
  Administered 2022-08-30: 10 mL

## 2022-08-30 MED ORDER — IRBESARTAN 75 MG PO TABS
75.0000 mg | ORAL_TABLET | Freq: Every day | ORAL | Status: DC
  Filled 2022-08-30: qty 1

## 2022-08-30 MED ORDER — ALBUTEROL SULFATE HFA 108 (90 BASE) MCG/ACT IN AERS: 2.0000 | INHALATION_SPRAY | Freq: Four times a day (QID) | RESPIRATORY_TRACT | Status: DC | PRN

## 2022-08-30 MED ORDER — SODIUM CHLORIDE 0.9 % IV SOLN: INTRAVENOUS | Status: DC

## 2022-08-30 MED ORDER — CLONIDINE HCL (ANALGESIA) 100 MCG/ML EP SOLN
EPIDURAL | Status: DC | PRN
  Administered 2022-08-30: 100 ug

## 2022-08-30 MED ORDER — PROPOFOL 10 MG/ML IV BOLUS
INTRAVENOUS | Status: DC | PRN
  Administered 2022-08-30: 60 mg via INTRAVENOUS
  Administered 2022-08-30: 20 mg via INTRAVENOUS

## 2022-08-30 MED ORDER — SODIUM CHLORIDE (PF) 0.9 % IJ SOLN
INTRAMUSCULAR | Status: AC
  Filled 2022-08-30: qty 10

## 2022-08-30 MED ORDER — PRAMIPEXOLE DIHYDROCHLORIDE 0.25 MG PO TABS: 0.1250 mg | ORAL_TABLET | Freq: Every day | ORAL | Status: DC

## 2022-08-30 MED ORDER — OXYCODONE HCL 5 MG PO TABS: 5.0000 mg | ORAL_TABLET | Freq: Once | ORAL | Status: DC | PRN

## 2022-08-30 MED ORDER — HYDROMORPHONE HCL 1 MG/ML IJ SOLN: 0.5000 mg | INTRAMUSCULAR | Status: DC | PRN

## 2022-08-30 MED ORDER — ACETAMINOPHEN 10 MG/ML IV SOLN
1000.0000 mg | Freq: Four times a day (QID) | INTRAVENOUS | Status: DC
  Administered 2022-08-30: 1000 mg via INTRAVENOUS
  Filled 2022-08-30: qty 100

## 2022-08-30 MED ORDER — LIDOCAINE HCL (PF) 2 % IJ SOLN
INTRAMUSCULAR | Status: DC | PRN
  Administered 2022-08-30: 60 mg via INTRADERMAL

## 2022-08-30 MED ORDER — ONDANSETRON HCL 4 MG/2ML IJ SOLN
INTRAMUSCULAR | Status: AC
  Filled 2022-08-30: qty 2

## 2022-08-30 MED ORDER — BUPIVACAINE LIPOSOME 1.3 % IJ SUSP: 20.0000 mL | Freq: Once | INTRAMUSCULAR | Status: DC

## 2022-08-30 MED ORDER — ACETAMINOPHEN 500 MG PO TABS
1000.0000 mg | ORAL_TABLET | Freq: Four times a day (QID) | ORAL | Status: DC
  Administered 2022-08-30 – 2022-08-31 (×2): 1000 mg via ORAL
  Filled 2022-08-30 (×3): qty 2

## 2022-08-30 MED ORDER — ASPIRIN 81 MG PO CHEW
81.0000 mg | CHEWABLE_TABLET | Freq: Two times a day (BID) | ORAL | Status: DC
  Administered 2022-08-31: 81 mg via ORAL
  Filled 2022-08-30: qty 1

## 2022-08-30 MED ORDER — POLYETHYLENE GLYCOL 3350 17 G PO PACK: 17.0000 g | PACK | Freq: Every day | ORAL | Status: DC | PRN

## 2022-08-30 MED ORDER — PHENOL 1.4 % MT LIQD: 1.0000 | OROMUCOSAL | Status: DC | PRN

## 2022-08-30 MED ORDER — SODIUM CHLORIDE 0.9 % IR SOLN
Status: DC | PRN
  Administered 2022-08-30: 1000 mL

## 2022-08-30 MED ORDER — TAMSULOSIN HCL 0.4 MG PO CAPS
0.4000 mg | ORAL_CAPSULE | Freq: Every day | ORAL | Status: DC
  Administered 2022-08-30: 0.4 mg via ORAL
  Filled 2022-08-30: qty 1

## 2022-08-30 MED ORDER — METHOCARBAMOL 500 MG PO TABS
500.0000 mg | ORAL_TABLET | Freq: Four times a day (QID) | ORAL | Status: DC | PRN
  Administered 2022-08-30 – 2022-08-31 (×2): 500 mg via ORAL
  Filled 2022-08-30 (×2): qty 1

## 2022-08-30 MED ORDER — STERILE WATER FOR IRRIGATION IR SOLN
Status: DC | PRN
  Administered 2022-08-30 (×2): 1000 mL

## 2022-08-30 MED ORDER — POTASSIUM CHLORIDE ER 10 MEQ PO TBCR: 10.0000 meq | EXTENDED_RELEASE_TABLET | Freq: Every day | ORAL | Status: DC

## 2022-08-30 MED ORDER — AMISULPRIDE (ANTIEMETIC) 5 MG/2ML IV SOLN: 10.0000 mg | Freq: Once | INTRAVENOUS | Status: DC | PRN

## 2022-08-30 MED ORDER — DIPHENHYDRAMINE HCL 12.5 MG/5ML PO ELIX
12.5000 mg | ORAL_SOLUTION | ORAL | Status: DC | PRN
  Administered 2022-08-30 – 2022-08-31 (×2): 25 mg via ORAL
  Filled 2022-08-30 (×2): qty 10

## 2022-08-30 MED ORDER — DOCUSATE SODIUM 100 MG PO CAPS
100.0000 mg | ORAL_CAPSULE | Freq: Two times a day (BID) | ORAL | Status: DC
  Administered 2022-08-30 – 2022-08-31 (×2): 100 mg via ORAL
  Filled 2022-08-30 (×2): qty 1

## 2022-08-30 MED ORDER — METOPROLOL TARTRATE 25 MG PO TABS
25.0000 mg | ORAL_TABLET | Freq: Two times a day (BID) | ORAL | Status: DC
  Administered 2022-08-31: 25 mg via ORAL
  Filled 2022-08-30 (×2): qty 1

## 2022-08-30 MED ORDER — LIDOCAINE HCL (PF) 2 % IJ SOLN
INTRAMUSCULAR | Status: AC
  Filled 2022-08-30: qty 5

## 2022-08-30 MED ORDER — OXYCODONE HCL 5 MG PO TABS
10.0000 mg | ORAL_TABLET | ORAL | Status: DC | PRN
  Administered 2022-08-30: 15 mg via ORAL
  Filled 2022-08-30: qty 3

## 2022-08-30 MED ORDER — EPHEDRINE SULFATE (PRESSORS) 50 MG/ML IJ SOLN
INTRAMUSCULAR | Status: DC | PRN
  Administered 2022-08-30 (×2): 10 mg via INTRAVENOUS

## 2022-08-30 MED ORDER — CEFAZOLIN IN SODIUM CHLORIDE 3-0.9 GM/100ML-% IV SOLN
3.0000 g | INTRAVENOUS | Status: AC
  Administered 2022-08-30: 3 g via INTRAVENOUS
  Filled 2022-08-30: qty 100

## 2022-08-30 MED ORDER — DEXAMETHASONE SODIUM PHOSPHATE 10 MG/ML IJ SOLN
8.0000 mg | Freq: Once | INTRAMUSCULAR | Status: AC
  Administered 2022-08-30: 5 mg via INTRAVENOUS

## 2022-08-30 MED ORDER — FUROSEMIDE 20 MG PO TABS
20.0000 mg | ORAL_TABLET | Freq: Every morning | ORAL | Status: DC
  Administered 2022-08-31: 20 mg via ORAL
  Filled 2022-08-30: qty 1

## 2022-08-30 MED ORDER — FENTANYL CITRATE PF 50 MCG/ML IJ SOSY
50.0000 ug | PREFILLED_SYRINGE | INTRAMUSCULAR | Status: DC
  Administered 2022-08-30: 50 ug via INTRAVENOUS
  Filled 2022-08-30: qty 2

## 2022-08-30 MED ORDER — POVIDONE-IODINE 10 % EX SWAB: 2.0000 | Freq: Once | CUTANEOUS | Status: DC

## 2022-08-30 MED ORDER — PROPOFOL 500 MG/50ML IV EMUL
INTRAVENOUS | Status: AC
  Filled 2022-08-30: qty 50

## 2022-08-30 MED ORDER — DEXAMETHASONE SODIUM PHOSPHATE 10 MG/ML IJ SOLN
INTRAMUSCULAR | Status: AC
  Filled 2022-08-30: qty 1

## 2022-08-30 MED ORDER — BUPIVACAINE LIPOSOME 1.3 % IJ SUSP
INTRAMUSCULAR | Status: DC | PRN
  Administered 2022-08-30: 20 mL

## 2022-08-30 MED ORDER — FENTANYL CITRATE PF 50 MCG/ML IJ SOSY: 25.0000 ug | PREFILLED_SYRINGE | INTRAMUSCULAR | Status: DC | PRN

## 2022-08-30 MED ORDER — PROPOFOL 500 MG/50ML IV EMUL
INTRAVENOUS | Status: DC | PRN
  Administered 2022-08-30: 90 ug/kg/min via INTRAVENOUS

## 2022-08-30 MED ORDER — ROSUVASTATIN CALCIUM 20 MG PO TABS
40.0000 mg | ORAL_TABLET | Freq: Every day | ORAL | Status: DC
  Administered 2022-08-31: 40 mg via ORAL
  Filled 2022-08-30: qty 2

## 2022-08-30 MED ORDER — EPHEDRINE 5 MG/ML INJ
INTRAVENOUS | Status: AC
  Filled 2022-08-30: qty 5

## 2022-08-30 MED ORDER — PNEUMOCOCCAL 20-VAL CONJ VACC 0.5 ML IM SUSY: 0.5000 mL | PREFILLED_SYRINGE | INTRAMUSCULAR | Status: DC | PRN

## 2022-08-30 MED ORDER — ONDANSETRON HCL 4 MG/2ML IJ SOLN
INTRAMUSCULAR | Status: DC | PRN
  Administered 2022-08-30: 4 mg via INTRAVENOUS

## 2022-08-30 MED ORDER — OXYCODONE HCL 5 MG/5ML PO SOLN: 5.0000 mg | Freq: Once | ORAL | Status: DC | PRN

## 2022-08-30 MED ORDER — METOCLOPRAMIDE HCL 5 MG/ML IJ SOLN: 5.0000 mg | Freq: Three times a day (TID) | INTRAMUSCULAR | Status: DC | PRN

## 2022-08-30 MED ORDER — SODIUM CHLORIDE (PF) 0.9 % IJ SOLN
INTRAMUSCULAR | Status: AC
  Filled 2022-08-30: qty 50

## 2022-08-30 MED ORDER — ONDANSETRON HCL 4 MG PO TABS: 4.0000 mg | ORAL_TABLET | Freq: Four times a day (QID) | ORAL | Status: DC | PRN

## 2022-08-30 MED ORDER — METOCLOPRAMIDE HCL 5 MG PO TABS: 5.0000 mg | ORAL_TABLET | Freq: Three times a day (TID) | ORAL | Status: DC | PRN

## 2022-08-30 SURGICAL SUPPLY — 61 items
ADH SKN CLS APL DERMABOND .7 (GAUZE/BANDAGES/DRESSINGS) ×1
ATTUNE MED DOME PAT 41 KNEE (Knees) IMPLANT
ATTUNE PS FEM LT SZ 8 CEM KNEE (Femur) IMPLANT
ATTUNE PSRP INSR SZ8 8 KNEE (Insert) IMPLANT
BAG COUNTER SPONGE SURGICOUNT (BAG) IMPLANT
BAG SPEC THK2 15X12 ZIP CLS (MISCELLANEOUS) ×1
BAG SPNG CNTER NS LX DISP (BAG)
BAG ZIPLOCK 12X15 (MISCELLANEOUS) ×1 IMPLANT
BASE TIBIAL ROT PLAT SZ 8 KNEE (Knees) IMPLANT
BLADE SAG 18X100X1.27 (BLADE) ×1 IMPLANT
BLADE SAW SGTL 11.0X1.19X90.0M (BLADE) ×1 IMPLANT
BNDG CMPR 5X62 HK CLSR LF (GAUZE/BANDAGES/DRESSINGS) ×1
BNDG CMPR MED 10X6 ELC LF (GAUZE/BANDAGES/DRESSINGS) ×1
BNDG ELASTIC 6INX 5YD STR LF (GAUZE/BANDAGES/DRESSINGS) ×1 IMPLANT
BNDG ELASTIC 6X10 VLCR STRL LF (GAUZE/BANDAGES/DRESSINGS) IMPLANT
BOWL SMART MIX CTS (DISPOSABLE) ×1 IMPLANT
BSPLAT TIB 8 CMNT ROT PLAT STR (Knees) ×1 IMPLANT
CEMENT HV SMART SET (Cement) ×2 IMPLANT
COVER SURGICAL LIGHT HANDLE (MISCELLANEOUS) ×1 IMPLANT
CUFF TOURN SGL QUICK 34 (TOURNIQUET CUFF) ×1
CUFF TRNQT CYL 34X4.125X (TOURNIQUET CUFF) ×1 IMPLANT
DERMABOND ADVANCED .7 DNX12 (GAUZE/BANDAGES/DRESSINGS) ×1 IMPLANT
DRAPE INCISE IOBAN 66X45 STRL (DRAPES) ×1 IMPLANT
DRAPE U-SHAPE 47X51 STRL (DRAPES) ×1 IMPLANT
DRSG AQUACEL AG ADV 3.5X10 (GAUZE/BANDAGES/DRESSINGS) ×1 IMPLANT
DURAPREP 26ML APPLICATOR (WOUND CARE) ×1 IMPLANT
ELECT REM PT RETURN 15FT ADLT (MISCELLANEOUS) ×1 IMPLANT
GLOVE BIO SURGEON STRL SZ 6.5 (GLOVE) IMPLANT
GLOVE BIO SURGEON STRL SZ7.5 (GLOVE) IMPLANT
GLOVE BIO SURGEON STRL SZ8 (GLOVE) ×1 IMPLANT
GLOVE BIOGEL PI IND STRL 6.5 (GLOVE) IMPLANT
GLOVE BIOGEL PI IND STRL 7.0 (GLOVE) IMPLANT
GLOVE BIOGEL PI IND STRL 8 (GLOVE) ×1 IMPLANT
GOWN STRL REUS W/ TWL LRG LVL3 (GOWN DISPOSABLE) ×1 IMPLANT
GOWN STRL REUS W/ TWL XL LVL3 (GOWN DISPOSABLE) IMPLANT
GOWN STRL REUS W/TWL LRG LVL3 (GOWN DISPOSABLE) ×1
GOWN STRL REUS W/TWL XL LVL3 (GOWN DISPOSABLE)
HANDPIECE INTERPULSE COAX TIP (DISPOSABLE) ×1
HOLDER FOLEY CATH W/STRAP (MISCELLANEOUS) IMPLANT
IMMOBILIZER KNEE 20 (SOFTGOODS) ×1
IMMOBILIZER KNEE 20 THIGH 36 (SOFTGOODS) ×1 IMPLANT
IMMOBILIZER KNEE 22 UNIV (SOFTGOODS) IMPLANT
KIT TURNOVER KIT A (KITS) IMPLANT
MANIFOLD NEPTUNE II (INSTRUMENTS) ×1 IMPLANT
NS IRRIG 1000ML POUR BTL (IV SOLUTION) ×1 IMPLANT
PACK TOTAL KNEE CUSTOM (KITS) ×1 IMPLANT
PADDING CAST COTTON 6X4 STRL (CAST SUPPLIES) ×2 IMPLANT
PIN STEINMAN FIXATION KNEE (PIN) IMPLANT
PROTECTOR NERVE ULNAR (MISCELLANEOUS) ×1 IMPLANT
SET HNDPC FAN SPRY TIP SCT (DISPOSABLE) ×1 IMPLANT
SPIKE FLUID TRANSFER (MISCELLANEOUS) ×1 IMPLANT
SUT MNCRL AB 4-0 PS2 18 (SUTURE) ×1 IMPLANT
SUT STRATAFIX 0 PDS 27 VIOLET (SUTURE) ×1
SUT VIC AB 2-0 CT1 27 (SUTURE) ×3
SUT VIC AB 2-0 CT1 TAPERPNT 27 (SUTURE) ×3 IMPLANT
SUTURE STRATFX 0 PDS 27 VIOLET (SUTURE) ×1 IMPLANT
TIBIAL BASE ROT PLAT SZ 8 KNEE (Knees) ×1 IMPLANT
TRAY FOLEY MTR SLVR 16FR STAT (SET/KITS/TRAYS/PACK) ×1 IMPLANT
TUBE SUCTION HIGH CAP CLEAR NV (SUCTIONS) ×1 IMPLANT
WATER STERILE IRR 1000ML POUR (IV SOLUTION) ×2 IMPLANT
WRAP KNEE MAXI GEL POST OP (GAUZE/BANDAGES/DRESSINGS) ×1 IMPLANT

## 2022-08-30 NOTE — Progress Notes (Signed)
Orthopedic Tech Progress Note Patient Details:  Garrett Welch III [birth year] [id number]  CPM Left Knee CPM Left Knee: Off Left Knee Flexion (Degrees): 40 Left Knee Extension (Degrees): 10  Post Interventions Patient Tolerated: Well Instructions Provided: Care of device  [name] 08/30/2022, 8:00 PM

## 2022-08-30 NOTE — Op Note (Signed)
OPERATIVE REPORT-TOTAL KNEE ARTHROPLASTY   Pre-operative diagnosis- Osteoarthritis  Left knee(s)  Post-operative diagnosis- Osteoarthritis Left knee(s)  Procedure-  Left  Total Knee Arthroplasty  Surgeon- Gus Rankin. Theresa Wedel, MD  Assistant- Arther Abbott, PA-C   Anesthesia-   Adductor canal block and spinal  EBL-25 mL   Drains None  Tourniquet time-  Total Tourniquet Time Documented: Thigh (Left) - 40 minutes Total: Thigh (Left) - 40 minutes     Complications- None  Condition-PACU - hemodynamically stable.   Brief Clinical Note   Garrett Welch is a 72 y.o. year old male with end stage OA of his left knee with progressively worsening pain and dysfunction. He has constant pain, with activity and at rest and significant functional deficits with difficulties even with ADLs. He has had extensive non-op management including analgesics, injections of cortisone and viscosupplements, and home exercise program, but remains in significant pain with significant dysfunction. Radiographs show bone on bone arthritis medial and patellofemoral. He presents now for left Total Knee Arthroplasty.     Procedure in detail---   The patient is brought into the operating room and positioned supine on the operating table. After successful administration of  Adductor canal block and spinal,   a tourniquet is placed high on the  Left thigh(s) and the lower extremity is prepped and draped in the usual sterile fashion. Time out is performed by the operating team and then the  Left lower extremity is wrapped in Esmarch, knee flexed and the tourniquet inflated to 300 mmHg.       A midline incision is made with a ten blade through the subcutaneous tissue to the level of the extensor mechanism. A fresh blade is used to make a medial parapatellar arthrotomy. Soft tissue over the proximal medial tibia is subperiosteally elevated to the joint line with a knife and into the semimembranosus bursa with a  Cobb elevator. Soft tissue over the proximal lateral tibia is elevated with attention being paid to avoiding the patellar tendon on the tibial tubercle. The patella is everted, knee flexed 90 degrees and the ACL and PCL are removed. Findings are bone on bone medial and patellofemoral with large global osteophytes.        The drill is used to create a starting hole in the distal femur and the canal is thoroughly irrigated with sterile saline to remove the fatty contents. The 5 degree Left  valgus alignment guide is placed into the femoral canal and the distal femoral cutting block is pinned to remove 10 mm off the distal femur. Resection is made with an oscillating saw.      The tibia is subluxed forward and the menisci are removed. The extramedullary alignment guide is placed referencing proximally at the medial aspect of the tibial tubercle and distally along the second metatarsal axis and tibial crest. The block is pinned to remove 2mm off the more deficient medial  side. Resection is made with an oscillating saw. Size 8is the most appropriate size for the tibia and the proximal tibia is prepared with the modular drill and keel punch for that size.      The femoral sizing guide is placed and size 8 is most appropriate. Rotation is marked off the epicondylar axis and confirmed by creating a rectangular flexion gap at 90 degrees. The size 8 cutting block is pinned in this rotation and the anterior, posterior and chamfer cuts are made with the oscillating saw. The intercondylar block is then placed and that  cut is made.      Trial size 8 tibial component, trial size 8 posterior stabilized femur and a 8  mm posterior stabilized rotating platform insert trial is placed. Full extension is achieved with excellent varus/valgus and anterior/posterior balance throughout full range of motion. The patella is everted and thickness measured to be 27  mm. Free hand resection is taken to 15 mm, a 41 template is placed, lug  holes are drilled, trial patella is placed, and it tracks normally. Osteophytes are removed off the posterior femur with the trial in place. All trials are removed and the cut bone surfaces prepared with pulsatile lavage. Cement is mixed and once ready for implantation, the size 8 tibial implant, size  8 posterior stabilized femoral component, and the size 41 patella are cemented in place and the patella is held with the clamp. The trial insert is placed and the knee held in full extension. The Exparel (20 ml mixed with 60 ml saline) is injected into the extensor mechanism, posterior capsule, medial and lateral gutters and subcutaneous tissues.  All extruded cement is removed and once the cement is hard the permanent 8 mm posterior stabilized rotating platform insert is placed into the tibial tray.      The wound is copiously irrigated with saline solution and the extensor mechanism closed with # 0 Stratofix suture. The tourniquet is released for a total tourniquet time of 40  minutes. Flexion against gravity is 140 degrees and the patella tracks normally. Subcutaneous tissue is closed with 2.0 vicryl and subcuticular with running 4.0 Monocryl. The incision is cleaned and dried and steri-strips and a bulky sterile dressing are applied. The limb is placed into a knee immobilizer and the patient is awakened and transported to recovery in stable condition.      Please note that a surgical assistant was a medical necessity for this procedure in order to perform it in a safe and expeditious manner. Surgical assistant was necessary to retract the ligaments and vital neurovascular structures to prevent injury to them and also necessary for proper positioning of the limb to allow for anatomic placement of the prosthesis.   Gus Rankin Kimm Sider, MD    08/30/2022, 3:56 PM

## 2022-08-30 NOTE — Anesthesia Procedure Notes (Signed)
Spinal  Patient location during procedure: OR Start time: 08/30/2022 2:43 PM End time: 08/30/2022 2:49 PM Reason for block: surgical anesthesia Staffing Performed: anesthesiologist  Anesthesiologist: Elmer Picker, MD Performed by: Kaylyn Layer, MD Authorized by: Kaylyn Layer, MD   Preanesthetic Checklist Completed: patient identified, IV checked, risks and benefits discussed, surgical consent, monitors and equipment checked, pre-op evaluation and timeout performed Spinal Block Patient position: sitting Prep: DuraPrep and site prepped and draped Patient monitoring: continuous pulse ox, blood pressure and heart rate Approach: midline Location: L3-4 Injection technique: single-shot Needle Needle type: Pencan  Needle gauge: 24 G Needle length: 9 cm Assessment Events: CSF return Additional Notes Risks, benefits, and alternative discussed. Patient gave consent to procedure. Prepped and draped in sitting position. Patient sedated but responsive to voice. Clear CSF obtained after 3 attempts. Positive terminal aspiration. No pain or paraesthesias with injection. Patient tolerated procedure well. Vital signs stable. Amalia Greenhouse, MD

## 2022-08-30 NOTE — Progress Notes (Signed)
Orthopedic Tech Progress Note Patient Details:  Garrett Welch 12/15/1950 [id number]  CPM Left Knee CPM Left Knee: On Left Knee Flexion (Degrees): 40 Left Knee Extension (Degrees): 10  Post Interventions Patient Tolerated: Well Instructions Provided: Care of device  [name] 08/30/2022, 4:56 PM

## 2022-08-30 NOTE — Interval H&P Note (Signed)
History and Physical Interval Note:  08/30/2022 11:59 AM  Garrett Welch  has presented today for surgery, with the diagnosis of Left knee osteoarthritis.  The various methods of treatment have been discussed with the patient and family. After consideration of risks, benefits and other options for treatment, the patient has consented to  Procedure(s): TOTAL KNEE ARTHROPLASTY (Left) as a surgical intervention.  The patient's history has been reviewed, patient examined, no change in status, stable for surgery.  I have reviewed the patient's chart and labs.  Questions were answered to the patient's satisfaction.     Homero Fellers Furman Trentman

## 2022-08-30 NOTE — Progress Notes (Signed)
RT note: Pt. set up/placed on [facility]-CPAP(DS)@ 8cmh20 with own hose/nasal mask on room air and tolerating well, RN aware, both made aware to notify if needed.

## 2022-08-30 NOTE — Anesthesia Postprocedure Evaluation (Signed)
Anesthesia Post Note  Patient: Garrett Welch  Procedure(s) Performed: TOTAL KNEE ARTHROPLASTY (Left: Knee)     Patient location during evaluation: PACU Anesthesia Type: Spinal Level of consciousness: awake and alert Pain management: pain level controlled Vital Signs Assessment: post-procedure vital signs reviewed and stable Respiratory status: spontaneous breathing, nonlabored ventilation and respiratory function stable Cardiovascular status: blood pressure returned to baseline Postop Assessment: no apparent nausea or vomiting, spinal receding, no headache and no backache Anesthetic complications: no   No notable events documented.  Last Vitals:  Vitals:   08/30/22 1700 08/30/22 1715  BP: 129/62 135/88  Pulse: (!) 48 (!) 48  Resp: 20 17  Temp:    SpO2: 98% 95%    Last Pain:  Vitals:   08/30/22 1715  TempSrc:   PainSc: 0-No pain                 Shanda Howells

## 2022-08-30 NOTE — Anesthesia Procedure Notes (Signed)
Anesthesia Regional Block: Adductor canal block   Pre-Anesthetic Checklist: , timeout performed,  Correct Patient, Correct Site, Correct Laterality,  Correct Procedure, Correct Position, site marked,  Risks and benefits discussed,  Pre-op evaluation,  At surgeon's request and post-op pain management  Laterality: Left  Prep: Maximum Sterile Barrier Precautions used, chloraprep       Needles:  Injection technique: Single-shot  Needle Type: Echogenic Stimulator Needle     Needle Length: 9cm  Needle Gauge: 22     Additional Needles:   Procedures:,,,, ultrasound used (permanent image in chart),,    Narrative:  Start time: 08/30/2022 2:02 PM End time: 08/30/2022 2:05 PM Injection made incrementally with aspirations every 5 mL.  Performed by: Personally  Anesthesiologist: Kaylyn Layer, MD  Additional Notes: Risks, benefits, and alternative discussed. Patient gave consent for procedure. Patient prepped and draped in sterile fashion. Sedation administered, patient remains easily responsive to voice. Relevant anatomy identified with ultrasound guidance. Local anesthetic given in 5cc increments with no signs or symptoms of intravascular injection. No pain or paraesthesias with injection. Patient monitored throughout procedure with signs of LAST or immediate complications. Tolerated well. Ultrasound image placed in chart.  Amalia Greenhouse, MD

## 2022-08-30 NOTE — Transfer of Care (Signed)
Immediate Anesthesia Transfer of Care Note  Patient: Garrett Welch  Procedure(s) Performed: TOTAL KNEE ARTHROPLASTY (Left: Knee)  Patient Location: PACU  Anesthesia Type:Spinal  Level of Consciousness: awake, alert , oriented, and patient cooperative  Airway & Oxygen Therapy: Patient Spontanous Breathing and Patient connected to face mask oxygen  Post-op Assessment: Report given to RN and Post -op Vital signs reviewed and stable  Post vital signs: Reviewed and stable  Last Vitals:  Vitals Value Taken Time  BP 101/57 08/30/22 1622  Temp    Pulse 57 08/30/22 1624  Resp 12 08/30/22 1624  SpO2 96 % 08/30/22 1624  Vitals shown include unvalidated device data.  Last Pain:  Vitals:   08/30/22 1158  TempSrc: Oral  PainSc:       Patients Stated Pain Goal: 3 (08/30/22 1149)  Complications: No notable events documented.

## 2022-08-31 ENCOUNTER — Encounter (HOSPITAL_COMMUNITY): Payer: Self-pay | Admitting: Orthopedic Surgery

## 2022-08-31 DIAGNOSIS — M1712 Unilateral primary osteoarthritis, left knee: Secondary | ICD-10-CM | POA: Diagnosis not present

## 2022-08-31 LAB — CBC
HCT: 35.1 % — ABNORMAL LOW (ref 39.0–52.0)
Hemoglobin: 11.7 g/dL — ABNORMAL LOW (ref 13.0–17.0)
MCH: 31.6 pg (ref 26.0–34.0)
MCHC: 33.3 g/dL (ref 30.0–36.0)
MCV: 94.9 fL (ref 80.0–100.0)
Platelets: 181 10*3/uL (ref 150–400)
RBC: 3.7 MIL/uL — ABNORMAL LOW (ref 4.22–5.81)
RDW: 11.6 % (ref 11.5–15.5)
WBC: 10 10*3/uL (ref 4.0–10.5)
nRBC: 0 % (ref 0.0–0.2)

## 2022-08-31 LAB — BASIC METABOLIC PANEL
Anion gap: 8 (ref 5–15)
BUN: 16 mg/dL (ref 8–23)
CO2: 21 mmol/L — ABNORMAL LOW (ref 22–32)
Calcium: 8.4 mg/dL — ABNORMAL LOW (ref 8.9–10.3)
Chloride: 103 mmol/L (ref 98–111)
Creatinine, Ser: 0.75 mg/dL (ref 0.61–1.24)
GFR, Estimated: >60 mL/min (ref >60)
Glucose, Bld: 201 mg/dL — ABNORMAL HIGH (ref 70–99)
Potassium: 4.4 mmol/L (ref 3.5–5.1)
Sodium: 132 mmol/L — ABNORMAL LOW (ref 135–145)

## 2022-08-31 MED ORDER — ASPIRIN 81 MG PO CHEW: 81.0000 mg | CHEWABLE_TABLET | Freq: Two times a day (BID) | ORAL | 0 refills | Status: AC

## 2022-08-31 MED ORDER — GABAPENTIN 300 MG PO CAPS: ORAL_CAPSULE | ORAL | 0 refills | Status: DC

## 2022-08-31 MED ORDER — METHOCARBAMOL 500 MG PO TABS: 500.0000 mg | ORAL_TABLET | Freq: Four times a day (QID) | ORAL | 0 refills | Status: DC | PRN

## 2022-08-31 MED ORDER — OXYCODONE HCL 5 MG PO TABS: 5.0000 mg | ORAL_TABLET | Freq: Four times a day (QID) | ORAL | 0 refills | Status: DC | PRN

## 2022-08-31 NOTE — TOC Transition Note (Signed)
Transition of Care [facility]) - CM/SW Discharge Note   Patient Details  Name: Garrett Welch MRN: [id number] Date of Birth: 1950/08/27  Transition of [facility]) CM/SW Contact:  Amada Jupiter, LCSW Phone Number: 08/31/2022, 10:43 AM   Clinical Narrative:    Met with pt and confirming he has needed DME at home.  OPPT already arranged with [facility].  No TOC needs.   Final next level of care: OP Rehab Barriers to Discharge: No Barriers Identified   Patient Goals and CMS Choice      Discharge Placement                         Discharge Plan and Services Additional resources added to the After Visit Summary for                  DME Arranged: N/A DME Agency: NA                  Social Determinants of Health (SDOH) Interventions SDOH Screenings   Food Insecurity: No Food Insecurity (08/30/2022)  Housing: Low Risk  (08/30/2022)  Transportation Needs: No Transportation Needs (08/30/2022)  Utilities: Not At Risk (08/30/2022)  Tobacco Use: Low Risk  (08/30/2022)     Readmission Risk Interventions     No data to display

## 2022-08-31 NOTE — Progress Notes (Signed)
Physical Therapy Treatment Patient Details Name: Garrett Welch MRN: [id number] DOB: 06/22/1950 Today's Date: 08/31/2022   History of Present Illness 72 yo male S/P  LTKA 08/30/22. PMH: CABG, RTKA,DM,HTN, gastic BP    PT Comments    Patient safely performed steps with crutch and rail. Patient has met PT goals  for Dc home.   Recommendations for follow up therapy are one component of a multi-disciplinary discharge planning process, led by the attending physician.  Recommendations may be updated based on patient status, additional functional criteria and insurance authorization.  Follow Up Recommendations       Assistance Recommended at Discharge PRN  Patient can return home with the following Help with stairs or ramp for entrance;Assistance with cooking/housework;Assist for transportation   Equipment Recommendations  None recommended by PT    Recommendations for Other Services       Precautions / Restrictions Precautions Precautions: Knee;Fall Restrictions Weight Bearing Restrictions: No LLE Weight Bearing: Weight bearing as tolerated     Mobility  Bed Mobility Overal bed mobility: Modified Independent             General bed mobility comments: seated on bed edge    Transfers Overall transfer level: Needs assistance Equipment used: Rolling walker (2 wheels) Transfers: Sit to/from Stand Sit to Stand: Supervision           General transfer comment: cues for Left leg    Ambulation/Gait Ambulation/Gait assistance: Supervision Gait Distance (Feet): 50 Feet Assistive device: Rolling walker (2 wheels) Gait Pattern/deviations: Step-to pattern, Step-through pattern       General Gait Details: cues for sequence   Stairs Stairs: Yes Stairs assistance: Min guard Stair Management: One rail Right, With crutches Number of Stairs: 2 General stair comments: cues for sequence   Wheelchair Mobility    Modified Rankin (Stroke Patients Only)        Balance Overall balance assessment: No apparent balance deficits (not formally assessed)                                          Cognition Arousal/Alertness: Awake/alert Behavior During Therapy: WFL for tasks assessed/performed Overall Cognitive Status: Within Functional Limits for tasks assessed                                          Exercises     General Comments        Pertinent Vitals/Pain Pain Assessment Pain Assessment: 0-10 Pain Score: 5  Pain Location: left knee Pain Descriptors / Indicators: Discomfort, Sore Pain Intervention(s): Monitored during session, Premedicated before session    Home Living Family/patient expects to be discharged to:: Private residence Living Arrangements: Spouse/significant other Available Help at Discharge: Family;Available 24 hours/day Type of Home: House Home Access: Stairs to enter Entrance Stairs-Rails: Right Entrance Stairs-Number of Steps: 4   Home Layout: One level Home Equipment: Agricultural consultant (2 wheels);Cane - single point      Prior Function            PT Goals (current goals can now be found in the care plan section) Acute Rehab PT Goals Patient Stated Goal: go home PT Goal Formulation: With patient/family Time For Goal Achievement: 09/07/22 Potential to Achieve Goals: Good Progress towards PT goals: Progressing toward goals  Frequency    Min 1X/week      PT Plan      Co-evaluation              AM-PAC PT "6 Clicks" Mobility   Outcome Measure  Help needed turning from your back to your side while in a flat bed without using bedrails?: None Help needed moving from lying on your back to sitting on the side of a flat bed without using bedrails?: None Help needed moving to and from a bed to a chair (including a wheelchair)?: A Little Help needed standing up from a chair using your arms (e.g., wheelchair or bedside chair)?: A Little Help needed to walk  in hospital room?: A Little Help needed climbing 3-5 steps with a railing? : A Little 6 Click Score: 20    End of Session Equipment Utilized During Treatment: Gait belt Activity Tolerance: Patient tolerated treatment well Patient left: in chair;with call bell/phone within reach;with family/visitor present Nurse Communication: Mobility status PT Visit Diagnosis: Difficulty in walking, not elsewhere classified (R26.2);Pain Pain - Right/Left: Left Pain - part of body: Knee     Time: 1610-9604 PT Time Calculation (min) (ACUTE ONLY): 10 min  Charges:  $Gait Training: 8-22 mins                 Blanchard Kelch PT Acute Rehabilitation Services Office [phone] Weekend [phone]    Rada Hay 08/31/2022, 11:36 AM

## 2022-08-31 NOTE — Progress Notes (Signed)
Discharge AVS and new medications discussed with patient. IV removed. Opportunity for questions given, all questions answered.   Smiley Houseman, RN

## 2022-08-31 NOTE — Evaluation (Addendum)
Physical Therapy Evaluation Patient Details Name: Garrett Welch MRN: [id number] DOB: 1950/12/31 Today's Date: 08/31/2022  History of Present Illness  72 yo male S/P  LTKA 08/30/22. PMH: CABG, RTKA,DM,HTN, gastic BP  Clinical Impression  The patient reports pain is mild. Ambulated x 100' with Rw. Will require  practice negotiating steps prior to DC. Pt admitted with above diagnosis.  Pt currently with functional limitations due to the deficits listed below (see PT Problem List). Pt will benefit from acute skilled PT to increase their independence and safety with mobility to allow discharge.            Recommendations for follow up therapy are one component of a multi-disciplinary discharge planning process, led by the attending physician.  Recommendations may be updated based on patient status, additional functional criteria and insurance authorization.  Follow Up Recommendations       Assistance Recommended at Discharge PRN  Patient can return home with the following  Help with stairs or ramp for entrance;Assistance with cooking/housework;Assist for transportation    Equipment Recommendations None recommended by PT  Recommendations for Other Services       Functional Status Assessment Patient has had a recent decline in their functional status and demonstrates the ability to make significant improvements in function in a reasonable and predictable amount of time.     Precautions / Restrictions Precautions Precautions: Knee;Fall Restrictions Weight Bearing Restrictions: No LLE Weight Bearing: Weight bearing as tolerated      Mobility  Bed Mobility Overal bed mobility: Modified Independent                  Transfers Overall transfer level: Needs assistance Equipment used: Rolling walker (2 wheels) Transfers: Sit to/from Stand Sit to Stand: Supervision           General transfer comment: cues for Left leg    Ambulation/Gait Ambulation/Gait  assistance: Supervision Gait Distance (Feet): 100 Feet  Assistive device: Rolling walker (2 wheels) Gait Pattern/deviations: Step-to pattern, Step-through pattern       General Gait Details: cues for sequence  Stairs            Wheelchair Mobility    Modified Rankin (Stroke Patients Only)       Balance Overall balance assessment: No apparent balance deficits (not formally assessed)                                           Pertinent Vitals/Pain Pain Assessment Pain Assessment: 0-10 Pain Score: 5  Pain Location: left knee Pain Descriptors / Indicators: Discomfort, Sore Pain Intervention(s): Monitored during session, Premedicated before session    Home Living Family/patient expects to be discharged to:: Private residence Living Arrangements: Spouse/significant other Available Help at Discharge: Family;Available 24 hours/day Type of Home: House Home Access: Stairs to enter Entrance Stairs-Rails: Right Entrance Stairs-Number of Steps: 4   Home Layout: One level Home Equipment: Agricultural consultant (2 wheels);Cane - single point      Prior Function Prior Level of Function : Independent/Modified Independent                     Hand Dominance        Extremity/Trunk Assessment   Upper Extremity Assessment Upper Extremity Assessment: Overall WFL for tasks assessed    Lower Extremity Assessment Lower Extremity Assessment: LLE deficits/detail RLE Deficits / Details: + SLR, knee  flex to 80*    Cervical / Trunk Assessment Cervical / Trunk Assessment: Normal  Communication   Communication: No difficulties  Cognition Arousal/Alertness: Awake/alert Behavior During Therapy: WFL for tasks assessed/performed Overall Cognitive Status: Within Functional Limits for tasks assessed                                          General Comments      Exercises Total Joint Exercises Ankle Circles/Pumps: AROM, Both Quad Sets:  AROM, Both Straight Leg Raises: AROM, Left, 10 reps Long Arc Quad: AROM, 10 reps, Left   Assessment/Plan    PT Assessment All further PT needs can be met in the next venue of care  PT Problem List Decreased strength;Decreased mobility;Decreased safety awareness;Decreased range of motion;Decreased activity tolerance;Pain       PT Treatment Interventions DME instruction;Gait training;Stair training;Functional mobility training;Therapeutic activities    PT Goals (Current goals can be found in the Care Plan section)  Acute Rehab PT Goals Patient Stated Goal: go home PT Goal Formulation: With patient/family Time For Goal Achievement: 09/07/22 Potential to Achieve Goals: Good    Frequency Min 1X/week     Co-evaluation               AM-PAC PT "6 Clicks" Mobility  Outcome Measure Help needed turning from your back to your side while in a flat bed without using bedrails?: None Help needed moving from lying on your back to sitting on the side of a flat bed without using bedrails?: None Help needed moving to and from a bed to a chair (including a wheelchair)?: A Little Help needed standing up from a chair using your arms (e.g., wheelchair or bedside chair)?: A Little Help needed to walk in hospital room?: A Little Help needed climbing 3-5 steps with a railing? : A Little 6 Click Score: 20    End of Session Equipment Utilized During Treatment: Gait belt Activity Tolerance: Patient tolerated treatment well Patient left: on edge of bed.;with call bell/phone within reach;with family/visitor present Nurse Communication: Mobility status PT Visit Diagnosis: Difficulty in walking, not elsewhere classified (R26.2);Pain Pain - Right/Left: Left Pain - part of body: Knee    Time: 1000-1018 PT Time Calculation (min) (ACUTE ONLY): 18 min   Charges:   PT Evaluation $PT Eval Low Complexity: 1 Low PT Treatments $Gait Training: 8-22 mins        Blanchard Kelch PT Acute Rehabilitation  Services Office [phone] Weekend [phone]   Rada Hay 08/31/2022, 11:31 AM

## 2022-08-31 NOTE — Progress Notes (Signed)
Subjective: 1 Day Post-Op Procedure(s) (LRB): TOTAL KNEE ARTHROPLASTY (Left) Patient reports pain as mild.   Patient seen in rounds by Dr. Lequita Halt. Patient is well, and has had no acute complaints or problems No issues overnight. Denies chest pain, SOB, or calf pain. Foley catheter removed this AM.  We will begin therapy today.  Objective: Vital signs in last 24 hours: Temp:  [97.5 F (36.4 C)-97.9 F (36.6 C)] 97.5 F (36.4 C) (05/07 0547) Pulse Rate:  [30-82] 69 (05/07 0132) Resp:  [13-20] 18 (05/07 0547) BP: (101-167)/(49-88) 116/60 (05/07 0547) SpO2:  [75 %-100 %] 96 % (05/07 0547) FiO2 (%):  [21 %] 21 % (05/06 2330) Weight:  [147.9 kg] 147.9 kg (05/06 1149)  Intake/Output from previous day:  Intake/Output Summary (Last 24 hours) at 08/31/2022 0739 Last data filed at 08/31/2022 0600 Gross per 24 hour  Intake 3386.31 ml  Output 3300 ml  Net 86.31 ml     Intake/Output this shift: No intake/output data recorded.  Labs: Recent Labs    08/30/22 1202 08/31/22 0334  HGB 13.6 11.7*   Recent Labs    08/30/22 1202 08/31/22 0334  WBC  --  10.0  RBC  --  3.70*  HCT 40.0 35.1*  PLT  --  181   Recent Labs    08/30/22 1202 08/31/22 0334  NA 137 132*  K 5.3* 4.4  CL 104 103  CO2  --  21*  BUN 23 16  CREATININE 0.80 0.75  GLUCOSE 139* 201*  CALCIUM  --  8.4*   No results for input(s): "LABPT", "INR" in the last 72 hours.  Exam: General - Patient is Alert and Oriented Extremity - Neurologically intact Neurovascular intact Sensation intact distally Dorsiflexion/Plantar flexion intact Dressing - dressing C/D/I Motor Function - intact, moving foot and toes well on exam.   Past Medical History:  Diagnosis Date   Anemia    Atrial flutter (HCC) 09/16/2017   S/p RFCA // EF was 30% in setting of AFl w/ RVR // EF improved in NSR (Echo 8/19: Mod conc LVH, EF 50-55, Gr 1 DD, mild dilated ascending aorta (40 mm), MAC, trivial TR)   CAD (coronary artery disease)  04/06/2018   LHC 10/19: dLM 40-50, LAD 100 CTO, D1 99, LCx 99, RI 60; R-L collats, EF normal // s/p CABG 11/19 (L-LAD, RIMA-Dx)   Cancer (HCC)    Diabetes mellitus without complication (HCC)    GERD (gastroesophageal reflux disease)    HTN (hypertension)    Hyperlipidemia LDL goal <70    OA (osteoarthritis)    right knee   PAC (premature atrial contraction)    Prostate cancer (HCC)    Shingles 04/2022   Sleep apnea    cpap    Assessment/Plan: 1 Day Post-Op Procedure(s) (LRB): TOTAL KNEE ARTHROPLASTY (Left) Principal Problem:   OA (osteoarthritis) of knee Active Problems:   Primary osteoarthritis of left knee  Estimated body mass index is 40.75 kg/m as calculated from the following:   Height as of this encounter: 6\' 3"  (1.905 m).   Weight as of this encounter: 147.9 kg. Advance diet Up with therapy D/C IV fluids   Patient's anticipated LOS is less than 2 midnights, meeting these requirements: - Younger than 36 - Lives within 1 hour of care - Has a competent adult at home to recover with post-op recover - NO history of  - Chronic pain requiring opiods  - Heart failure  - Heart attack  - Stroke  -  DVT/VTE  - Respiratory Failure/COPD  - Renal failure  - Anemia  - Advanced Liver disease  DVT Prophylaxis - Aspirin Weight bearing as tolerated. Continue therapy.  Plan is to go Home after hospital stay. Plan for discharge later today if progresses with therapy and meeting goals. Scheduled for OPPT at [facility]. Follow-up in the office in 2 weeks.  The PDMP database was reviewed today prior to any opioid medications being prescribed to this patient.  Arther Abbott, PA-C Orthopedic Surgery [phone] 08/31/2022, 7:39 AM

## 2022-09-06 NOTE — Discharge Summary (Signed)
Patient ID: Garrett Welch MRN: [id number] DOB/AGE: 70-Jan-1952 70 y.o.  Admit date: 08/30/2022 Discharge date: 08/31/2022  Admission Diagnoses:  Principal Problem:   OA (osteoarthritis) of knee Active Problems:   Primary osteoarthritis of left knee   Discharge Diagnoses:  Same  Past Medical History:  Diagnosis Date   Anemia    Atrial flutter (HCC) 09/16/2017   S/p RFCA // EF was 30% in setting of AFl w/ RVR // EF improved in NSR (Echo 8/19: Mod conc LVH, EF 50-55, Gr 1 DD, mild dilated ascending aorta (40 mm), MAC, trivial TR)   CAD (coronary artery disease) 04/06/2018   LHC 10/19: dLM 40-50, LAD 100 CTO, D1 99, LCx 99, RI 60; R-L collats, EF normal // s/p CABG 11/19 (L-LAD, RIMA-Dx)   Cancer (HCC)    Diabetes mellitus without complication (HCC)    GERD (gastroesophageal reflux disease)    HTN (hypertension)    Hyperlipidemia LDL goal <70    OA (osteoarthritis)    right knee   PAC (premature atrial contraction)    Prostate cancer (HCC)    Shingles 04/2022   Sleep apnea    cpap    Surgeries: Procedure(s): TOTAL KNEE ARTHROPLASTY on 08/30/2022   Consultants:   Discharged Condition: Improved  Hospital Course: Meshilem Shurn Welch is an 72 y.o. male who was admitted 08/30/2022 for operative treatment ofOA (osteoarthritis) of knee. Patient has severe unremitting pain that affects sleep, daily activities, and work/hobbies. After pre-op clearance the patient was taken to the operating room on 08/30/2022 and underwent  Procedure(s): TOTAL KNEE ARTHROPLASTY.    Patient was given perioperative antibiotics:  Anti-infectives (From admission, onward)    Start     Dose/Rate Route Frequency Ordered Stop   08/30/22 2100  ceFAZolin (ANCEF) IVPB 2g/100 mL premix        2 g 200 mL/hr over 30 Minutes Intravenous Every 6 hours 08/30/22 1810 08/31/22 0411   08/30/22 1145  ceFAZolin (ANCEF) IVPB 3g/100 mL premix        3 g 200 mL/hr over 30 Minutes Intravenous On call to O.R.  08/30/22 1130 08/30/22 1507        Patient was given sequential compression devices, early ambulation, and chemoprophylaxis to prevent DVT.  Patient benefited maximally from hospital stay and there were no complications.    Recent vital signs: No data found.   Recent laboratory studies: No results for input(s): "WBC", "HGB", "HCT", "PLT", "NA", "K", "CL", "CO2", "BUN", "CREATININE", "GLUCOSE", "INR", "CALCIUM" in the last 72 hours.  Invalid input(s): "PT", "2"   Discharge Medications:   Allergies as of 08/31/2022       Reactions   Tape Rash   With prolonged use--blisters   Tramadol Hives, Rash, Other (See Comments)   Skin irritation   Amoxicillin Nausea Only   Clavulanic Acid    Other Reaction(s): Not available   Amoxicillin-pot Clavulanate Nausea Only        Medication List     STOP taking these medications    aspirin EC 81 MG tablet Replaced by: aspirin 81 MG chewable tablet   cefdinir 300 MG capsule Commonly known as: OMNICEF       TAKE these medications    albuterol 108 (90 Base) MCG/ACT inhaler Commonly known as: VENTOLIN HFA Inhale 2 puffs into the lungs every 6 (six) hours as needed for wheezing or shortness of breath.   aspirin 81 MG chewable tablet Chew 1 tablet (81 mg total) by mouth 2 (two) times  daily for 21 days. Then resume one 81 mg aspirin once a day. Replaces: aspirin EC 81 MG tablet   b complex vitamins capsule Take 1 capsule by mouth daily.   famotidine 40 MG tablet Commonly known as: PEPCID Take 40 mg by mouth every evening.   ferrous sulfate 325 (65 FE) MG EC tablet Take 1 tablet (325 mg total) by mouth daily with breakfast. What changed: when to take this   fluticasone 50 MCG/ACT nasal spray Commonly known as: FLONASE Place 2 sprays into both nostrils daily as needed for allergies.   furosemide 40 MG tablet Commonly known as: LASIX Take 20 mg by mouth every morning.   gabapentin 300 MG capsule Commonly known as:  NEURONTIN Take a 300 mg capsule three times a day for two weeks following surgery.Then take a 300 mg capsule two times a day for two weeks. Then take a 300 mg capsule once a day for two weeks. Then discontinue.   irbesartan 75 MG tablet Commonly known as: AVAPRO TAKE 1 TABLET BY MOUTH EVERY DAY   metFORMIN 500 MG tablet Commonly known as: GLUCOPHAGE Take 1,000 mg by mouth 2 (two) times daily.   methocarbamol 500 MG tablet Commonly known as: ROBAXIN Take 1 tablet (500 mg total) by mouth every 6 (six) hours as needed for muscle spasms.   metoprolol tartrate 25 MG tablet Commonly known as: LOPRESSOR TAKE 0.5 TABLETS BY MOUTH 2 TIMES DAILY.   multivitamin with minerals tablet Take 1 tablet by mouth daily. Centrum   oxyCODONE 5 MG immediate release tablet Commonly known as: Oxy IR/ROXICODONE Take 1-2 tablets (5-10 mg total) by mouth every 6 (six) hours as needed for severe pain.   Polyethyl Glycol-Propyl Glycol 0.4-0.3 % Soln Place 1 drop into both eyes 3 (three) times daily as needed (for dry/irritated eyes.).   potassium chloride 10 MEQ tablet Commonly known as: KLOR-CON TAKE 1 TABLET BY MOUTH EVERY DAY   pramipexole 0.125 MG tablet Commonly known as: MIRAPEX Take 0.125 mg by mouth at bedtime.   rosuvastatin 40 MG tablet Commonly known as: CRESTOR TAKE 1 TABLET BY MOUTH EVERY DAY   sodium fluoride 1.1 % Crea dental cream Commonly known as: PREVIDENT 5000 PLUS Take 1 application by mouth 2 (two) times daily.   tamsulosin 0.4 MG Caps capsule Commonly known as: FLOMAX Take 0.4 mg by mouth at bedtime.   triamcinolone cream 0.1 % Commonly known as: KENALOG Apply 1 application  topically daily as needed (rash).   vitamin E 180 MG (400 UNITS) capsule Take 800 Units by mouth daily.               Discharge Care Instructions  (From admission, onward)           Start     Ordered   08/31/22 0000  Weight bearing as tolerated        08/31/22 0743   08/31/22  0000  Change dressing       Comments: You may remove the bulky bandage (ACE wrap and gauze) two days after surgery. You will have an adhesive waterproof bandage underneath. Leave this in place until your first follow-up appointment.   08/31/22 0743            Diagnostic Studies: No results found.  Disposition: Discharge disposition: 01-Home or Self Care       Discharge Instructions     Call MD / Call 911   Complete by: As directed    If you experience chest  pain or shortness of breath, CALL 911 and be transported to the hospital emergency room.  If you develope a fever above 101 F, pus (white drainage) or increased drainage or redness at the wound, or calf pain, call your surgeon's office.   Change dressing   Complete by: As directed    You may remove the bulky bandage (ACE wrap and gauze) two days after surgery. You will have an adhesive waterproof bandage underneath. Leave this in place until your first follow-up appointment.   Constipation Prevention   Complete by: As directed    Drink plenty of fluids.  Prune juice may be helpful.  You may use a stool softener, such as Colace (over the counter) 100 mg twice a day.  Use MiraLax (over the counter) for constipation as needed.   Diet - low sodium heart healthy   Complete by: As directed    Do not put a pillow under the knee. Place it under the heel.   Complete by: As directed    Driving restrictions   Complete by: As directed    No driving for two weeks   Post-operative opioid taper instructions:   Complete by: As directed    POST-OPERATIVE OPIOID TAPER INSTRUCTIONS: It is important to wean off of your opioid medication as soon as possible. If you do not need pain medication after your surgery it is ok to stop day one. Opioids include: Codeine, Hydrocodone(Norco, Vicodin), Oxycodone(Percocet, oxycontin) and hydromorphone amongst others.  Long term and even short term use of opiods can cause: Increased pain  response Dependence Constipation Depression Respiratory depression And more.  Withdrawal symptoms can include Flu like symptoms Nausea, vomiting And more Techniques to manage these symptoms Hydrate well Eat regular healthy meals Stay active Use relaxation techniques(deep breathing, meditating, yoga) Do Not substitute Alcohol to help with tapering If you have been on opioids for less than two weeks and do not have pain than it is ok to stop all together.  Plan to wean off of opioids This plan should start within one week post op of your joint replacement. Maintain the same interval or time between taking each dose and first decrease the dose.  Cut the total daily intake of opioids by one tablet each day Next start to increase the time between doses. The last dose that should be eliminated is the evening dose.      TED hose   Complete by: As directed    Use stockings (TED hose) for three weeks on both leg(s).  You may remove them at night for sleeping.   Weight bearing as tolerated   Complete by: As directed           Signed: Arther Abbott 09/06/2022, 10:06 AM

## 2022-09-17 ENCOUNTER — Encounter: Payer: Self-pay | Admitting: Cardiovascular Disease

## 2022-09-17 ENCOUNTER — Ambulatory Visit: Payer: Medicare Other | Attending: Cardiovascular Disease | Admitting: Cardiovascular Disease

## 2022-09-17 VITALS — BP 122/68 | HR 58 | Ht 75.0 in | Wt 327.0 lb

## 2022-09-17 DIAGNOSIS — E785 Hyperlipidemia, unspecified: Secondary | ICD-10-CM | POA: Diagnosis present

## 2022-09-17 DIAGNOSIS — I5032 Chronic diastolic (congestive) heart failure: Secondary | ICD-10-CM | POA: Insufficient documentation

## 2022-09-17 DIAGNOSIS — I10 Essential (primary) hypertension: Secondary | ICD-10-CM | POA: Diagnosis present

## 2022-09-17 DIAGNOSIS — I77810 Thoracic aortic ectasia: Secondary | ICD-10-CM | POA: Diagnosis present

## 2022-09-17 DIAGNOSIS — Z951 Presence of aortocoronary bypass graft: Secondary | ICD-10-CM | POA: Insufficient documentation

## 2022-09-17 NOTE — Progress Notes (Signed)
Cardiology Office Note:    Date:  09/17/2022   ID:  Adella Hare Welch, DOB 1951/04/21, MRN [id number]  PCP:  Martyn Malay, MD   [facility] Providers Cardiologist:  Tonny Bollman, MD Electrophysiologist:  Lewayne Bunting, MD     Referring MD: Martyn Malay, MD   Chief Complaint  Patient presents with   Coronary Artery Disease    History of Present Illness:    Garrett Welch is a 72 y.o. male presenting for follow-up evaluation.  He is here alone today.  He has a history of multivessel CAD status post two-vessel CABG in 2019.  Comorbid conditions include hypertension, mixed hyperlipidemia, type 2 diabetes, atrial flutter status post ablation, sleep apnea, and morbid obesity.  The patient underwent two-vessel CABG with a LIMA to LAD and free RIMA to diagonal in 2019.  He had atrial flutter with RVR also in 2019 and this was associated with severe LV dysfunction.  LVEF improved after treatment of his atrial flutter.  The patient recently had his left knee replaced and he is having a good recovery.  He underwent right total knee replacement previously.  Past Medical History:  Diagnosis Date   Anemia    Atrial flutter (HCC) 09/16/2017   S/p RFCA // EF was 30% in setting of AFl w/ RVR // EF improved in NSR (Echo 8/19: Mod conc LVH, EF 50-55, Gr 1 DD, mild dilated ascending aorta (40 mm), MAC, trivial TR)   CAD (coronary artery disease) 04/06/2018   LHC 10/19: dLM 40-50, LAD 100 CTO, D1 99, LCx 99, RI 60; R-L collats, EF normal // s/p CABG 11/19 (L-LAD, RIMA-Dx)   Cancer (HCC)    Diabetes mellitus without complication (HCC)    GERD (gastroesophageal reflux disease)    HTN (hypertension)    Hyperlipidemia LDL goal <70    OA (osteoarthritis)    right knee   PAC (premature atrial contraction)    Prostate cancer (HCC)    Shingles 04/2022   Sleep apnea    cpap    Past Surgical History:  Procedure Laterality Date   A-FLUTTER ABLATION N/A 10/18/2017    Procedure: A-FLUTTER ABLATION;  Surgeon: Marinus Maw, MD;  Location: [facility] INVASIVE CV LAB;  Service: Cardiovascular;  Laterality: N/A;   CARDIAC CATHETERIZATION  09/2014   [facility] in [place], LAD 100%, 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 Plating of sternal fracture.;  Surgeon: Alleen Borne, MD;  [facility];  Service: Open Heart Surgery;  Laterality: N/A;  with bilateral IMA   GASTRIC BYPASS     20 yrs. ago   LEFT HEART CATH AND CORONARY ANGIOGRAPHY N/A 02/22/2018   Procedure: LEFT HEART CATH AND CORONARY ANGIOGRAPHY;  Surgeon: Tonny Bollman, MD;  Location: [facility] INVASIVE CV LAB;  Service: Cardiovascular;  Laterality: N/A;   prostate radioactive seeds implant 2009     RADIAL ARTERY HARVEST N/A 03/20/2018   Procedure: possible RADIAL ARTERY HARVEST;  Surgeon: Alleen Borne, MD;  Location: [facility] OR;  Service: Open Heart Surgery;  Laterality: N/A;   TEE WITHOUT CARDIOVERSION N/A 03/20/2018   Procedure: TRANSESOPHAGEAL ECHOCARDIOGRAM (TEE);  Surgeon: Alleen Borne, MD;  Location: [facility] OR;  Service: Open Heart Surgery;  Laterality: N/A;   TOTAL KNEE ARTHROPLASTY Right 08/24/2021   Procedure: TOTAL KNEE ARTHROPLASTY;  Surgeon: Ollen Gross, MD;  Location: [facility] ORS;  Service: Orthopedics;  Laterality: Right;   TOTAL KNEE ARTHROPLASTY Left 08/30/2022   Procedure: TOTAL KNEE ARTHROPLASTY;  Surgeon: Ollen Gross, MD;  Location: [facility] ORS;  Service: Orthopedics;  Laterality: Left;    Current Medications: Current Meds  Medication Sig   albuterol (PROVENTIL HFA;VENTOLIN HFA) 108 (90 Base) MCG/ACT inhaler Inhale 2 puffs into the lungs every 6 (six) hours as needed for wheezing or shortness of breath.   aspirin 81 MG chewable tablet Chew 1 tablet (81 mg total) by mouth 2 (two) times daily for 21 days. Then resume one 81 mg aspirin once a  day.   b complex vitamins capsule Take 1 capsule by mouth daily.   famotidine (PEPCID) 40 MG tablet Take 40 mg by mouth every evening.   ferrous sulfate 325 (65 FE) MG EC tablet Take 1 tablet (325 mg total) by mouth daily with breakfast. (Patient taking differently: Take 325 mg by mouth 2 (two) times daily.)   fluticasone (FLONASE) 50 MCG/ACT nasal spray Place 2 sprays into both nostrils daily as needed for allergies.    furosemide (LASIX) 40 MG tablet Take 20 mg by mouth every morning.   gabapentin (NEURONTIN) 300 MG capsule Take a 300 mg capsule three times a day for two weeks following surgery.Then take a 300 mg capsule two times a day for two weeks. Then take a 300 mg capsule once a day for two weeks. Then discontinue.   irbesartan (AVAPRO) 75 MG tablet TAKE 1 TABLET BY MOUTH EVERY DAY   metFORMIN (GLUCOPHAGE) 500 MG tablet Take 1,000 mg by mouth 2 (two) times daily.    metoprolol tartrate (LOPRESSOR) 25 MG tablet TAKE 0.5 TABLETS BY MOUTH 2 TIMES DAILY.   Multiple Vitamins-Minerals (MULTIVITAMIN WITH MINERALS) tablet Take 1 tablet by mouth daily. Centrum   Polyethyl Glycol-Propyl Glycol 0.4-0.3 % SOLN Place 1 drop into both eyes 3 (three) times daily as needed (for dry/irritated eyes.).   potassium chloride (KLOR-CON) 10 MEQ tablet TAKE 1 TABLET BY MOUTH EVERY DAY   pramipexole (MIRAPEX) 0.125 MG tablet Take 0.125 mg by mouth at bedtime.   rosuvastatin (CRESTOR) 40 MG tablet TAKE 1 TABLET BY MOUTH EVERY DAY   sodium fluoride (PREVIDENT 5000 PLUS) 1.1 % CREA dental cream Take 1 application by mouth 2 (two) times daily.    tamsulosin (FLOMAX) 0.4 MG CAPS capsule Take 0.4 mg by mouth at bedtime.    triamcinolone cream (KENALOG) 0.1 % Apply 1 application  topically daily as needed (rash).   vitamin E 180 MG (400 UNITS) capsule Take 800 Units by mouth daily.     Allergies:   Tape, Tramadol, Amoxicillin, Amoxicillin-pot clavulanate, and Clavulanic acid   Social History   Socioeconomic History    Marital status: Married    Spouse name: Not on file   Number of children: Not on file   Years of education: Not on file   Highest education level: Not on file  Occupational History   Occupation: Pastor  Tobacco Use   Smoking status: Never   Smokeless tobacco: Never  Vaping Use   Vaping Use: Never used  Substance and Sexual Activity   Alcohol use: Never   Drug use: Never   Sexual activity: Not on file  Other Topics Concern   Not on file  Social History Narrative   Works as a Pharmacologist. Never smoker.    Social Determinants of Health   Financial Resource Strain: Not on file  Food Insecurity: No Food Insecurity (  08/30/2022)   Hunger Vital Sign    Worried About Running Out of Food in the Last Year: Never true    Ran Out of Food in the Last Year: Never true  Transportation Needs: No Transportation Needs (08/30/2022)   PRAPARE - Administrator, Civil Service (Medical): No    Lack of Transportation (Non-Medical): No  Physical Activity: Not on file  Stress: Not on file  Social Connections: Not on file     Family History: The patient's family history includes Cancer in his father; Heart attack in his mother; Heart disease in his mother; Heart failure in his mother; Hypertension in his maternal grandmother and mother; Kidney failure in his father; Stroke in his father.  ROS:   Please see the history of present illness.    All other systems reviewed and are negative.  EKGs/Labs/Other Studies Reviewed:    The following studies were reviewed today: Cardiac Studies & Procedures   CARDIAC CATHETERIZATION  CARDIAC CATHETERIZATION 02/22/2018  Narrative  Prox RCA to Mid RCA lesion is 25% stenosed.  Post Atrio lesion is 50% stenosed.  Mid LM to Dist LM lesion is 50% stenosed.  Ost Ramus to Ramus lesion is 60% stenosed.  Ost Cx to Prox Cx lesion is 99% stenosed.  Ost LAD to Prox LAD lesion is 95% stenosed.  Prox LAD lesion is 100% stenosed.  Ost 1st  Diag lesion is 99% stenosed.  The left ventricular ejection fraction is 50-55% by visual estimate.  1.  Mild to moderate distal left main stenosis estimated at 40 to 50% 2.  Chronic total occlusion of the proximal LAD with right to left collaterals supplying the mid and distal LAD territory 3.  Subtotal occlusion of the first diagonal branch in the left circumflex 4.  Moderate stenosis of the large ramus intermedius branch estimated at 60% 5.  Patent RCA with mild nonobstructive stenosis (large dominant vessel) 6.  Mild segmental contraction abnormality the left ventricle with preserved overall LV systolic function  Recommend: The patient does not have typical symptoms of angina.  However he has moderate ischemia on stress testing and based on assessment of his coronary anatomy as at large burden of potential ischemia involving the LAD, diagonal branch, circumflex, and intermediate branches.  I do not think he has any feasible PCI options.  I am going to refer him for an outpatient cardiac surgical evaluation for consideration of CABG versus ongoing medical therapy.  Findings Coronary Findings Diagnostic  Dominance: Right  Left Main Mid LM to Dist LM lesion is 50% stenosed.  Left Anterior Descending Ost LAD to Prox LAD lesion is 95% stenosed. Prox LAD lesion is 100% stenosed. The LAD is occluded proximally at the origin of the first diagonal. The vessel fills via a well-formed collateral from the distal RCA.  First Diagonal Branch Ost 1st Diag lesion is 99% stenosed. the first diagonal is large in caliber with subtotal proximal occlusion. Fills late.  Second Septal Branch Collaterals 2nd Sept filled by collaterals from 1st RPL.  Third Septal Branch Collaterals 3rd Sept filled by collaterals from RPDA.  Ramus Intermedius Ost Ramus to Ramus lesion is 60% stenosed. Diffuse 60% stenosis of the ramus intermedius. This branch is large and supplies multiple sub-branches.  Left  Circumflex Ost Cx to Prox Cx lesion is 99% stenosed. small circumflex, subtotal proximal occlusion, fills late  Right Coronary Artery Prox RCA to Mid RCA lesion is 25% stenosed.  Right Posterior Atrioventricular Artery Post Atrio lesion is 50%  stenosed.  Intervention  No interventions have been documented.   STRESS TESTS  MYOCARDIAL PERFUSION IMAGING 01/10/2018  Narrative  Nuclear stress EF: 41%.  Blood pressure demonstrated a normal response to exercise.  There was no ST segment deviation noted during stress.  Defect 1: There is a medium defect of moderate severity present in the apical anterior, apical septal, apical inferior, apical lateral and apex location.  This is an intermediate risk study.  Findings consistent with ischemia.  The left ventricular ejection fraction is moderately decreased (30-44%).  Abnormal, intermediate risk stress nuclear study with apical thinning and superimposed mild to moderate ischemia in the apex.  The gated ejection fraction is 41% though visually appears better.  Hypokinesis of the distal septum and apex.  Mild left ventricular enlargement.   ECHOCARDIOGRAM  ECHOCARDIOGRAM COMPLETE 05/27/2021  Narrative ECHOCARDIOGRAM REPORT    Patient Name:   Jonavin EDWARD RUCKI Welch Date of Exam: 05/27/2021 Medical Rec #:  [id number]                  Height:       75.0 in Accession #:    [id number]                 Weight:       346.6 lb Date of Birth:  25-Mar-1951                  BSA:          2.772 m Patient Age:    70 years                   BP:           148/82 mmHg Patient Gender: M                          HR:           61 bpm. Exam Location:  [address]  Procedure: 2D Echo, Cardiac Doppler and Color Doppler  Indications:    I50.31 CHF  History:        Patient has prior history of Echocardiogram examinations, most recent 12/16/2017. CHF, CAD, Prior CABG, Arrythmias:PAC and Palpitations; Risk Factors:Morbid obesity,  Hypertension, Diabetes and Dyslipidemia.  Sonographer:    Samule Ohm RDCS Referring Phys: [id number] Zachary George SWINYER  IMPRESSIONS   1. Left ventricular ejection fraction, by estimation, is 60 to 65%. The left ventricle has normal function. The left ventricle has no regional wall motion abnormalities. There is severe concentric left ventricular hypertrophy. Left ventricular diastolic parameters are consistent with Grade I diastolic dysfunction (impaired relaxation). Elevated left ventricular end-diastolic pressure. 2. Right ventricular systolic function is normal. The right ventricular size is normal. Tricuspid regurgitation signal is inadequate for assessing PA pressure. 3. Left atrial size was mildly dilated. 4. The mitral valve is normal in structure. No evidence of mitral valve regurgitation. No evidence of mitral stenosis. 5. The aortic valve is calcified. Aortic valve regurgitation is not visualized. Mild aortic valve stenosis. Aortic valve mean gradient measures 12.0 mmHg. Aortic valve Vmax measures 2.31 m/s. 6. Aortic dilatation noted. There is mild dilatation of the ascending aorta, measuring 42 mm. 7. The inferior vena cava is normal in size with greater than 50% respiratory variability, suggesting right atrial pressure of 3 mmHg.  FINDINGS Left Ventricle: Left ventricular ejection fraction, by estimation, is 60 to 65%. The left ventricle has normal function. The left ventricle has no regional wall  motion abnormalities. The left ventricular internal cavity size was normal in size. There is severe concentric left ventricular hypertrophy. Left ventricular diastolic parameters are consistent with Grade I diastolic dysfunction (impaired relaxation). Elevated left ventricular end-diastolic pressure.  Right Ventricle: The right ventricular size is normal. No increase in right ventricular wall thickness. Right ventricular systolic function is normal. Tricuspid regurgitation signal is  inadequate for assessing PA pressure.  Left Atrium: Left atrial size was mildly dilated.  Right Atrium: Right atrial size was normal in size.  Pericardium: There is no evidence of pericardial effusion.  Mitral Valve: The mitral valve is normal in structure. Mild to moderate mitral annular calcification. No evidence of mitral valve regurgitation. No evidence of mitral valve stenosis.  Tricuspid Valve: The tricuspid valve is normal in structure. Tricuspid valve regurgitation is not demonstrated. No evidence of tricuspid stenosis.  Aortic Valve: The aortic valve is calcified. Aortic valve regurgitation is not visualized. Mild aortic stenosis is present. Aortic valve mean gradient measures 12.0 mmHg. Aortic valve peak gradient measures 21.4 mmHg.  Pulmonic Valve: The pulmonic valve was normal in structure. Pulmonic valve regurgitation is trivial. No evidence of pulmonic stenosis.  Aorta: Aortic dilatation noted. There is mild dilatation of the ascending aorta, measuring 42 mm.  Venous: The inferior vena cava is normal in size with greater than 50% respiratory variability, suggesting right atrial pressure of 3 mmHg.  IAS/Shunts: No atrial level shunt detected by color flow Doppler.   LEFT VENTRICLE PLAX 2D LVIDd:         5.50 cm Diastology LVIDs:         3.90 cm LV e' medial:    4.96 cm/s LV PW:         1.50 cm LV E/e' medial:  16.3 LV IVS:        1.70 cm LV e' lateral:   9.05 cm/s LV E/e' lateral: 8.9   RIGHT VENTRICLE         IVC TAPSE (M-mode): 1.6 cm  IVC diam: 1.30 cm  LEFT ATRIUM              Index        RIGHT ATRIUM           Index LA diam:        5.40 cm  1.95 cm/m   RA Pressure: 3.00 mmHg LA Vol (A2C):   107.0 ml 38.60 ml/m  RA Area:     16.50 cm LA Vol (A4C):   86.1 ml  31.06 ml/m  RA Volume:   35.20 ml  12.70 ml/m LA Biplane Vol: 101.0 ml 36.44 ml/m AORTIC VALVE AV Vmax:           231.40 cm/s AV Vmean:          160.200 cm/s AV VTI:            0.489 m AV Peak  Grad:      21.4 mmHg AV Mean Grad:      12.0 mmHg LVOT Vmax:         92.20 cm/s LVOT Vmean:        66.860 cm/s LVOT VTI:          0.205 m LVOT/AV VTI ratio: 0.42  AORTA Ao Asc diam: 4.20 cm  MITRAL VALVE                TRICUSPID VALVE MV Area (PHT): 1.69 cm     Estimated RAP:  3.00 mmHg MV Decel Time: 450 msec  MV E velocity: 80.80 cm/s   SHUNTS MV A velocity: 151.00 cm/s  Systemic VTI: 0.20 m MV E/A ratio:  0.54  Armanda Magic MD Electronically signed by Armanda Magic MD Signature Date/Time: 05/27/2021/8:16:37 AM    Final   TEE  ECHO TEE 03/22/2018  Interpretation Summary  Septum: No Patent Foramen Ovale present.  Left atrium: Patent foramen ovale not present.   MONITORS  LONG TERM MONITOR (3-14 DAYS) 06/10/2021  Narrative Patch Wear Time:  3 days and 2 hours (2023-01-24T10:06:25-0500 to 2023-01-27T12:07:21-0500)  Patient had a min HR of 34 bpm, max HR of 121 bpm, and avg HR of 55 bpm. Predominant underlying rhythm was Sinus Rhythm. Bundle Branch Block/IVCD was present. 2 Supraventricular Tachycardia runs occurred, the run with the fastest interval lasting 7 beats with a max rate of 121 bpm, the longest lasting 11 beats with an avg rate of 93 bpm. Isolated SVEs were frequent (27.7%, 16109), SVE Couplets were rare (<1.0%, 565), and SVE Triplets were rare (<1.0%, 91). Isolated VEs were rare (<1.0%), VE Couplets were rare (<1.0%), and no VE Triplets were present. Occasional change in QRS axis possibly due to positional changes.  1. The basic rhythm is normal sinus rhythm/sinus bradycardia with an average HR of 55 bpm 2. No atrial fibrillation or flutter 3. No high-grade heart block or pathologic pauses 4. There are rare PVCs occurring with a burden of less than 1% 5. There are frequent supraventricular beats with an overall burden of 28% but no long episodes of SVT            EKG:  EKG is not ordered today.    Recent Labs: 08/31/2022: BUN 16; Creatinine, Ser 0.75;  Hemoglobin 11.7; Platelets 181; Potassium 4.4; Sodium 132  Recent Lipid Panel    Component Value Date/Time   CHOL 115 05/19/2021 1009   TRIG 56 05/19/2021 1009   HDL 56 05/19/2021 1009   CHOLHDL 2.1 05/19/2021 1009   CHOLHDL 3.0 06/05/2015 0855   VLDL 13 06/05/2015 0855   LDLCALC 46 05/19/2021 1009     Risk Assessment/Calculations:                Physical Exam:    VS:  BP 122/68   Pulse (!) 58   Ht 6\' 3"  (1.905 m)   Wt (!) 327 lb (148.3 kg)   SpO2 99%   BMI 40.87 kg/m     Wt Readings from Last 3 Encounters:  09/17/22 (!) 327 lb (148.3 kg)  08/30/22 (!) 326 lb (147.9 kg)  08/18/22 (!) 326 lb (147.9 kg)     GEN:  Well nourished, well developed pleasant obese male in no acute distress HEENT: Normal NECK: No JVD; No carotid bruits LYMPHATICS: No lymphadenopathy CARDIAC: RRR, no murmurs, rubs, gallops RESPIRATORY:  Clear to auscultation without rales, wheezing or rhonchi  ABDOMEN: Soft, non-tender, non-distended MUSCULOSKELETAL:  1+ BL pretibial and ankle edema; No deformity  SKIN: Warm and dry with chronic lower extremity stasis dermatitis NEUROLOGIC:  Alert and oriented x 3 PSYCHIATRIC:  Normal affect   ASSESSMENT:    1. S/P CABG x 2   2. Ascending aorta dilatation (HCC)   3. Chronic diastolic heart failure (HCC)   4. Essential hypertension   5. Hyperlipidemia LDL goal <70    PLAN:    In order of problems listed above:  Stable without angina.  Continue aspirin for antiplatelet therapy.  Follow-up in 1 year. Chest CTA ordered and pending.  If stable, we will spread out  surveillance imaging. Appears clinically stable on low-dose furosemide and irbesartan.  Blood pressure is well-controlled.  No changes made today. Blood pressure is well-controlled Treated with rosuvastatin 40 mg daily.  Cholesterol is 126, HDL 64, LDL 48.            Medication Adjustments/Labs and Tests Ordered: Current medicines are reviewed at length with the patient today.   Concerns regarding medicines are outlined above.  No orders of the defined types were placed in this encounter.  No orders of the defined types were placed in this encounter.   Patient Instructions  Medication Instructions:  Your physician recommends that you continue on your current medications as directed. Please refer to the Current Medication list given to you today.  *If you need a refill on your cardiac medications before your next appointment, please call your pharmacy*  Lab Work: If you have labs (blood work) drawn today and your tests are completely normal, you will receive your results only by: MyChart Message (if you have MyChart) OR A paper copy in the mail If you have any lab test that is abnormal or we need to change your treatment, we will call you to review the results.  Follow-Up: At [facility], you and your health needs are our priority.  As part of our continuing mission to provide you with exceptional heart care, we have created designated Provider Care Teams.  These Care Teams include your primary Cardiologist (physician) and Advanced Practice Providers (APPs -  Physician Assistants and Nurse Practitioners) who all work together to provide you with the care you need, when you need it.  We recommend signing up for the patient portal called "MyChart".  Sign up information is provided on this After Visit Summary.  MyChart is used to connect with patients for Virtual Visits (Telemedicine).  Patients are able to view lab/test results, encounter notes, upcoming appointments, etc.  Non-urgent messages can be sent to your provider as well.   To learn more about what you can do with MyChart, go to [email or web address].    Your next appointment:   6 month(s)  Provider:   Eligha Bridegroom, NP     Then, Tonny Bollman, MD will plan to see you again in 1 year(s).      Signed, Tonny Bollman, MD  09/17/2022 6:16 PM    [facility]

## 2022-09-17 NOTE — Patient Instructions (Signed)
Medication Instructions:  Your physician recommends that you continue on your current medications as directed. Please refer to the Current Medication list given to you today.  *If you need a refill on your cardiac medications before your next appointment, please call your pharmacy*  Lab Work: If you have labs (blood work) drawn today and your tests are completely normal, you will receive your results only by: MyChart Message (if you have MyChart) OR A paper copy in the mail If you have any lab test that is abnormal or we need to change your treatment, we will call you to review the results.  Follow-Up: At [facility], you and your health needs are our priority.  As part of our continuing mission to provide you with exceptional heart care, we have created designated Provider Care Teams.  These Care Teams include your primary Cardiologist (physician) and Advanced Practice Providers (APPs -  Physician Assistants and Nurse Practitioners) who all work together to provide you with the care you need, when you need it.  We recommend signing up for the patient portal called "MyChart".  Sign up information is provided on this After Visit Summary.  MyChart is used to connect with patients for Virtual Visits (Telemedicine).  Patients are able to view lab/test results, encounter notes, upcoming appointments, etc.  Non-urgent messages can be sent to your provider as well.   To learn more about what you can do with MyChart, go to [email or web address].    Your next appointment:   6 month(s)  Provider:   Eligha Bridegroom, NP     Then, Tonny Bollman, MD will plan to see you again in 1 year(s).

## 2022-10-04 ENCOUNTER — Ambulatory Visit
Admission: RE | Admit: 2022-10-04 | Discharge: 2022-10-04 | Disposition: A | Discharge status: Home / Self Care | Payer: Medicare Other | Source: Ambulatory Visit | Attending: Nurse Practitioner | Admitting: Nurse Practitioner

## 2022-10-04 DIAGNOSIS — I77810 Thoracic aortic ectasia: Secondary | ICD-10-CM

## 2022-10-04 DIAGNOSIS — I5032 Chronic diastolic (congestive) heart failure: Secondary | ICD-10-CM

## 2022-10-04 DIAGNOSIS — Z951 Presence of aortocoronary bypass graft: Secondary | ICD-10-CM

## 2022-10-04 MED ORDER — IOPAMIDOL (ISOVUE-370) INJECTION 76%
75.0000 mL | Freq: Once | INTRAVENOUS | Status: AC | PRN
  Administered 2022-10-04: 75 mL via INTRAVENOUS

## 2023-04-10 NOTE — Progress Notes (Unsigned)
Cardiology Office Note:  .   Date:  04/14/2023  ID:  [name], DOB 1950-05-29, MRN [id number] PCP: Martyn Malay, MD  [facility] Providers Cardiologist:  Tonny Bollman, MD Electrophysiologist:  Lewayne Bunting, MD    Patient Profile: .      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  In the past he had patent right coronary artery without significant stenosis.  LV function was normal until he developed atrial flutter with RVR and echo revealed LVEF around 30%.  He ultimately underwent flutter ablation with restoration of sinus rhythm and improvement of LVEF to 50 to 55%.  In October 2019 he underwent nuclear stress test which demonstrated apical thinning and mild to moderate anterior apical ischemia consistent with LAD occlusion.  Dr. [name] recommended cardiac catheterization to evaluate progressive CAD.  Cardiac cath demonstrated mild to moderate distal left main disease (40 to 50%), CTO of the LAD with right to left collaterals, subtotal occlusion of D1 and left circumflex and moderate stenosis of R1.  There were no PCI options and he was referred for CABG.  He underwent CABG x 2 (LIMA to LAD, free RIMA to Diag) by Dr. Laneta Simmers 02/2018.  Post bypass course was fairly uneventful aside from bradycardia requiring reduction in BB dose and volume overload requiring diuretic therapy.  He underwent emergency cholecystectomy for acute cholecystitis 05/2018.  He had frequent PACs noted on cardiac monitor but due to history of bradycardia, we could not uptitrate beta-blocker dose.  He underwent left total knee arthroscopy  08/2022.  Last cardiology clinic visit with Dr. [name] 09/17/22.  He was not having any cardiac symptoms and BP was well-controlled.  LDL was 48 on most recent lipid panel.  Plan to spread out surveillance imaging if chest CT showed stable aortic dilatation.  He was advised to return in 6 months for follow-up.       History of Present Illness: .   Garrett Welch is a very pleasant 72 y.o. male who is here today for follow-up of CAD. He reports two episodes of palpitations in the past year, one in September and another in November, each lasting about fifteen minutes. Symptoms have not occurred with riding his recumbent bike which he does 3-4 times each week.  He denies chest pain, shortness of breath, orthopnea, PND, edema, presyncope, or syncope. He discontinued famotidine due to potential that it was contributing to palpitations. He admits he needs to work harder on losing weight. Slight increase in LDL cholesterol recently which his PCP told him "we will watch it." Admits to dietary indiscretion. Has not been able to play golf recently because his wife had hip replacement surgery, she is doing well.    Discussed the use of AI scribe software for clinical note transcription with the patient, who gave verbal consent to proceed.   ROS: See HPI       Studies Reviewed: Marland Kitchen   EKG Interpretation Date/Time:  Thursday April 14 2023 10:13:03 EST Ventricular Rate:  67 PR Interval:  208 QRS Duration:  152 QT Interval:  442 QTC Calculation: 467 R Axis:   -53  Text Interpretation: Sinus rhythm with  Premature atrial complexes in a pattern of bigeminy Right bundle branch block Left anterior fascicular block Bifascicular block When compared with ECG of 21-Mar-2018 06:41, Premature atrial complexes are now Present (RBBB and left anterior fascicular block) is now Present Confirmed by Eligha Bridegroom [phone]) on 04/14/2023 10:32:21 AM     Risk Assessment/Calculations:             Physical  Exam:   VS:  BP 132/78   Pulse 67   Ht 6\' 3"  (1.905 m)   Wt (!) 347 lb 9.6 oz (157.7 kg)   SpO2 96%   BMI 43.45 kg/m    Wt Readings from Last 3 Encounters:  04/14/23 (!) 347 lb 9.6 oz (157.7 kg)  09/17/22 (!) 327 lb (148.3 kg)  08/30/22 (!) 326 lb (147.9 kg)    GEN: Well nourished, well developed in no acute distress NECK: No JVD; No carotid bruits CARDIAC: RRR, 2/6 systolic murmur. No rubs, gallops RESPIRATORY:  Clear to auscultation without rales, wheezing or rhonchi  ABDOMEN: Soft, non-tender, non-distended EXTREMITIES:  No edema; No deformity     ASSESSMENT AND PLAN: .    Palpitations: History of atrial flutter s/p ablation 09/2017. Recent increase in palpitations.  EKG today reveals sinus rhythm at 67 bpm with frequent PACs and bigeminy pattern, RBBB, LAFB.  Cardiac monitor 05/2021 revealed frequent PACs without sustained arrhythmia.  He has been on low-dose metoprolol for years.  We have not uptitrated it due to bradycardia at baseline.  We will repeat 14-day ZIO monitor for evaluation of palpitations. Continue metoprolol.   CAD without angina: S/p CABG x 2 in 2019. He continues to exercise 3-4 days per week on his recumbent bike with no concerning cardiac symptoms. He denies chest pain, dyspnea, or other symptoms concerning for angina.  No indication for further ischemic evaluation at this time. No bleeding concerns. Most recent LDL 72. We will add ezetimibe 10 mg daily.  Encouraged weight loss with heart healthy mostly plant based diet avoiding saturated fat, processed foods, simple carbohydrates, and sugar along with aiming for at least 150 minutes of moderate intensity exercise each week. Continue irbesartan, metoprolol, rosuvastatin.  Chronic HFpEF: Recent echo 05/27/2021 with EF 60 to 65%, severe LVH, G1 DD, elevated LVEDP, normal RV.  Body habitus makes it difficult to assess volume status but no evidence of volume overload on exam. He has gained 20 lbs in the last 6 months. Admits  to dietary indiscretion.  He denies shortness of breath, orthopnea, PND, or edema.  He reports brisk urine output with Lasix. We will get repeat echocardiogram to evaluate heart function.  Continue furosemide, irbesartan, metoprolol.  Hyperlipidemia LDL goal < 55: Lipid panel completed 03/28/2023 reveals total cholesterol 144, triglycerides 83, HDL 56, and LDL 72.  We discussed goal LDL 55 or lower.  He admits to dietary indiscretion.  I have encouraged him to follow a heart healthy diet like the Mediterranean and avoid saturated fat, processed foods, sugar, and simple carbohydrates.  We will add ezetimibe 10 mg daily in addition to rosuvastatin 40 mg daily.  Repeat lipid panel and ALT in 2 to 3 months.  Hypertension: BP is well controlled. Renal function is stable on labs completed 03/28/2023.  No medication changes today.   Obesity: 20 lb weight gain since 08/2022. He continues to exercise regularly but admits to dietary indiscretion.  We discussed initiation of GLP-1 agonist in the setting of history of diabetes and to improve weight loss. He is agreeable to start  Ozempic 0.25 mg once weekly for 4 weeks.  We will call him at that time to assess his tolerance and uptitrate if well-tolerated. Encouraged weight loss with avoidance of "junk food" and to eat a plant forward diet avoiding processed foods, sugar, simple carbohydrates, and saturated fat. Continue regular exercise.   Aortic stenosis: Echo 05/27/2021 revealed mild aortic stenosis with mean gradient of 12 mmHg. Soft systolic murmur noted on exam. He is asymptomatic. We will repeat echocardiogram for surveillance of AS.   Ascending aortic dilatation: CTA with stable ectasia of ascending aorta measuring 3.9 cm. Per Dr. [name], primary cardiologist, no need to repeat annual imaging due to stability of aortic dilatation.         Dispo: 3 months with me  Signed, Eligha Bridegroom, NP-C

## 2023-04-14 ENCOUNTER — Ambulatory Visit: Payer: Medicare Other | Attending: Nurse Practitioner | Admitting: Nurse Practitioner

## 2023-04-14 ENCOUNTER — Other Ambulatory Visit: Payer: Self-pay | Admitting: *Deleted

## 2023-04-14 ENCOUNTER — Encounter: Payer: Self-pay | Admitting: Nurse Practitioner

## 2023-04-14 ENCOUNTER — Ambulatory Visit (INDEPENDENT_AMBULATORY_CARE_PROVIDER_SITE_OTHER): Payer: Medicare Other

## 2023-04-14 VITALS — BP 132/78 | HR 67 | Ht 75.0 in | Wt 347.6 lb

## 2023-04-14 DIAGNOSIS — I5032 Chronic diastolic (congestive) heart failure: Secondary | ICD-10-CM

## 2023-04-14 DIAGNOSIS — Z951 Presence of aortocoronary bypass graft: Secondary | ICD-10-CM | POA: Diagnosis present

## 2023-04-14 DIAGNOSIS — I10 Essential (primary) hypertension: Secondary | ICD-10-CM

## 2023-04-14 DIAGNOSIS — I491 Atrial premature depolarization: Secondary | ICD-10-CM

## 2023-04-14 DIAGNOSIS — I77810 Thoracic aortic ectasia: Secondary | ICD-10-CM

## 2023-04-14 DIAGNOSIS — R002 Palpitations: Secondary | ICD-10-CM | POA: Insufficient documentation

## 2023-04-14 DIAGNOSIS — I2510 Atherosclerotic heart disease of native coronary artery without angina pectoris: Secondary | ICD-10-CM | POA: Insufficient documentation

## 2023-04-14 DIAGNOSIS — I350 Nonrheumatic aortic (valve) stenosis: Secondary | ICD-10-CM | POA: Diagnosis present

## 2023-04-14 DIAGNOSIS — E785 Hyperlipidemia, unspecified: Secondary | ICD-10-CM | POA: Diagnosis not present

## 2023-04-14 MED ORDER — OZEMPIC (0.25 OR 0.5 MG/DOSE) 2 MG/1.5ML ~~LOC~~ SOPN: PEN_INJECTOR | SUBCUTANEOUS | 1 refills | Status: DC

## 2023-04-14 MED ORDER — EZETIMIBE 10 MG PO TABS: 10.0000 mg | ORAL_TABLET | Freq: Every day | ORAL | 3 refills | Status: DC

## 2023-04-14 NOTE — Patient Instructions (Signed)
Medication Instructions:   START Ozempic inject (0.25 mg) weekly for 4 weeks than I will call you to see how you are doing till we start on the next dose.   START Zetia one (1) tablet by mouth (10 mg) daily.   *If you need a refill on your cardiac medications before your next appointment, please call your pharmacy*   Lab Work:  Your physician recommends that you return for a FASTING lipid profile and the end of February a week prior to appointment with Marcelino Duster. Fasting after midnight. Paperwork given to pt today.   If you have labs (blood work) drawn today and your tests are completely normal, you will receive your results only by: MyChart Message (if you have MyChart) OR A paper copy in the mail If you have any lab test that is abnormal or we need to change your treatment, we will call you to review the results.   Testing/Procedures:  Your physician has requested that you have an echocardiogram. Echocardiography is a painless test that uses sound waves to create images of your heart. It provides your doctor with information about the size and shape of your heart and how well your heart's chambers and valves are working. This procedure takes approximately one hour. There are no restrictions for this procedure. Please do NOT wear cologne, aftershave, or lotions (deodorant is allowed). Please arrive 15 minutes prior to your appointment time.  Please note: We ask at that you not bring children with you during ultrasound (echo/ vascular) testing. Due to room size and safety concerns, children are not allowed in the ultrasound rooms during exams. Our front office staff cannot provide observation of children in our lobby area while testing is being conducted. An adult accompanying a patient to their appointment will only be allowed in the ultrasound room at the discretion of the ultrasound technician under special circumstances. We apologize for any inconvenience.  ZIO XT- Long Term Monitor  Instructions  Your physician has requested you wear a ZIO patch monitor for 14 days.  This is a single patch monitor. [employer] supplies one patch monitor per enrollment. Additional stickers are not available. Please do not apply patch if you will be having a Nuclear Stress Test,  Echocardiogram, Cardiac CT, MRI, or Chest Xray during the period you would be wearing the  monitor. The patch cannot be worn during these tests. You cannot remove and re-apply the  ZIO XT patch monitor.  Your ZIO patch monitor will be mailed 3 day USPS to your address on file. It may take 3-5 days  to receive your monitor after you have been enrolled.  Once you have received your monitor, please review the enclosed instructions. Your monitor  has already been registered assigning a specific monitor serial # to you.  Billing and Patient Assistance Program Information  We have supplied [employer] with any of your insurance information on file for billing purposes. [employer] offers a sliding scale Patient Assistance Program for patients that do not have  insurance, or whose insurance does not completely cover the cost of the ZIO monitor.  You must apply for the Patient Assistance Program to qualify for this discounted rate.  To apply, please call [employer] at [phone], select option 4, select option 2, ask to apply for  Patient Assistance Program. Meredeth Ide will ask your household income, and how many people  are in your household. They will quote your out-of-pocket cost based on that information.  [employer] will also be able to set up  a 87-month, interest-free payment plan if needed.  Applying the monitor   Shave hair from upper left chest.  Hold abrader disc by orange tab. Rub abrader in 40 strokes over the upper left chest as  indicated in your monitor instructions.  Clean area with 4 enclosed alcohol pads. Let dry.  Apply patch as indicated in monitor instructions. Patch will be placed under collarbone on left  side  of chest with arrow pointing upward.  Rub patch adhesive wings for 2 minutes. Remove white label marked "1". Remove the white  label marked "2". Rub patch adhesive wings for 2 additional minutes.  While looking in a mirror, press and release button in center of patch. A small green light will  flash 3-4 times. This will be your only indicator that the monitor has been turned on.  Do not shower for the first 24 hours. You may shower after the first 24 hours.  Press the button if you feel a symptom. You will hear a small click. Record Date, Time and  Symptom in the Patient Logbook.  When you are ready to remove the patch, follow instructions on the last 2 pages of Patient  Logbook. Stick patch monitor onto the last page of Patient Logbook.  Place Patient Logbook in the blue and white box. Use locking tab on box and tape box closed  securely. The blue and white box has prepaid postage on it. Please place it in the mailbox as  soon as possible. Your physician should have your test results approximately 7 days after the  monitor has been mailed back to [facility].  Call [facility] Customer Care at [phone] if you have questions regarding  your ZIO XT patch monitor. Call them immediately if you see an orange light blinking on your  monitor.  If your monitor falls off in less than 4 days, contact our Monitor department at [phone].  If your monitor becomes loose or falls off after 4 days call [employer] at [phone] for  suggestions on securing your monitor   Follow-Up: At [facility], you and your health needs are our priority.  As part of our continuing mission to provide you with exceptional heart care, we have created designated Provider Care Teams.  These Care Teams include your primary Cardiologist (physician) and Advanced Practice Providers (APPs -  Physician Assistants and Nurse Practitioners) who all work together to provide you with the care you need, when you  need it.  We recommend signing up for the patient portal called "MyChart".  Sign up information is provided on this After Visit Summary.  MyChart is used to connect with patients for Virtual Visits (Telemedicine).  Patients are able to view lab/test results, encounter notes, upcoming appointments, etc.  Non-urgent messages can be sent to your provider as well.   To learn more about what you can do with MyChart, go to [email or web address].    Your next appointment:   3 month(s)  Provider:   Eligha Bridegroom, NP

## 2023-04-14 NOTE — Progress Notes (Unsigned)
Applied a 14 day Zio XT monitor to patient in the office  [name] to read

## 2023-05-09 MED ORDER — OZEMPIC (0.25 OR 0.5 MG/DOSE) 2 MG/1.5ML ~~LOC~~ SOPN: PEN_INJECTOR | SUBCUTANEOUS | 1 refills | Status: DC

## 2023-05-09 NOTE — Telephone Encounter (Signed)
-----   Message from [facility] Nataleigh Griffin G sent at 04/14/2023 10:45 AM EST ----- Call pt to check how pt is doing on the 0.25 Ozempic before ordering 0.5.

## 2023-05-09 NOTE — Telephone Encounter (Signed)
 Lvm to see how pt was doing on ozempic .25 mg.  If ok will order 0.5.

## 2023-05-16 ENCOUNTER — Ambulatory Visit (HOSPITAL_COMMUNITY): Payer: Medicare Other | Attending: Cardiology

## 2023-05-16 DIAGNOSIS — I350 Nonrheumatic aortic (valve) stenosis: Secondary | ICD-10-CM

## 2023-05-16 DIAGNOSIS — I491 Atrial premature depolarization: Secondary | ICD-10-CM | POA: Diagnosis present

## 2023-05-16 DIAGNOSIS — I10 Essential (primary) hypertension: Secondary | ICD-10-CM

## 2023-05-16 DIAGNOSIS — I2510 Atherosclerotic heart disease of native coronary artery without angina pectoris: Secondary | ICD-10-CM

## 2023-05-16 DIAGNOSIS — E785 Hyperlipidemia, unspecified: Secondary | ICD-10-CM | POA: Diagnosis present

## 2023-05-16 DIAGNOSIS — I5032 Chronic diastolic (congestive) heart failure: Secondary | ICD-10-CM | POA: Diagnosis present

## 2023-05-16 LAB — ECHOCARDIOGRAM COMPLETE
AR max vel: 1.33 cm2
AV Area VTI: 1.59 cm2
AV Area mean vel: 1.37 cm2
AV Mean grad: 21 mm[Hg]
AV Peak grad: 35 mm[Hg]
Ao pk vel: 2.96 m/s
Area-P 1/2: 2.17 cm2
S' Lateral: 4.2 cm

## 2023-05-24 ENCOUNTER — Other Ambulatory Visit: Payer: Self-pay | Admitting: Nurse Practitioner

## 2023-06-21 ENCOUNTER — Other Ambulatory Visit: Payer: Self-pay

## 2023-06-21 MED ORDER — ROSUVASTATIN CALCIUM 40 MG PO TABS: 40.0000 mg | ORAL_TABLET | Freq: Every day | ORAL | 3 refills | Status: DC

## 2023-06-22 ENCOUNTER — Telehealth (HOSPITAL_BASED_OUTPATIENT_CLINIC_OR_DEPARTMENT_OTHER): Payer: Self-pay | Admitting: *Deleted

## 2023-06-22 ENCOUNTER — Other Ambulatory Visit: Payer: Self-pay

## 2023-06-22 LAB — ALT: ALT: 119 [IU]/L — ABNORMAL HIGH (ref 0–44)

## 2023-06-22 LAB — LIPID PANEL
Chol/HDL Ratio: 2.1 {ratio} (ref 0.0–5.0)
Cholesterol, Total: 115 mg/dL (ref 100–199)
HDL: 55 mg/dL (ref >39)
LDL Chol Calc (NIH): 44 mg/dL (ref 0–99)
Triglycerides: 80 mg/dL (ref 0–149)
VLDL Cholesterol Cal: 16 mg/dL (ref 5–40)

## 2023-06-22 MED ORDER — EZETIMIBE 10 MG PO TABS: 10.0000 mg | ORAL_TABLET | Freq: Every day | ORAL | 3 refills | Status: DC

## 2023-06-22 MED ORDER — METOPROLOL TARTRATE 25 MG PO TABS: 12.5000 mg | ORAL_TABLET | Freq: Two times a day (BID) | ORAL | 3 refills | Status: DC

## 2023-06-22 NOTE — Telephone Encounter (Signed)
 S/w pt to see how pt was doing on first month of Ozempic. Pt is doing fine. Pt does get explosive diaharrea weekly.  Pt is on the .05 dose and has 3 doses left. Will send to Jolly Mango D to see what pts next dose of Ozempic is That needs to be sent to mail order per Lebron Conners.

## 2023-06-23 ENCOUNTER — Other Ambulatory Visit: Payer: Self-pay | Admitting: *Deleted

## 2023-06-23 NOTE — Telephone Encounter (Signed)
 Since patient is experiencing diarrhea weekly, I would recommend he remain on Ozempic 0.5mg  weekly for a little longer. He should be advised to avoid fried foods and foods high in fat as this can make diarrhea worse.  Since he has 3 doses left, It might be worth it to wait another 2 weeks before sending in Rx to see if his diarrhea improves if we can increase dose or stay.

## 2023-06-29 ENCOUNTER — Other Ambulatory Visit (HOSPITAL_BASED_OUTPATIENT_CLINIC_OR_DEPARTMENT_OTHER): Payer: Self-pay | Admitting: *Deleted

## 2023-06-29 DIAGNOSIS — I4719 Other supraventricular tachycardia: Secondary | ICD-10-CM

## 2023-06-29 NOTE — Progress Notes (Unsigned)
 Cardiology Office Note:  .   Date:  06/30/2023  ID:  Garrett Welch, DOB 1950/12/12, MRN [id number] PCP: Martyn Malay, MD  [facility] Providers Cardiologist:  Tonny Bollman, MD Electrophysiologist:  Lewayne Bunting, MD    Patient Profile: .      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  In the past he had patent right coronary artery without significant stenosis.  LV function was normal until he developed atrial flutter with RVR and echo revealed LVEF around 30%.  He ultimately underwent flutter ablation with restoration of sinus rhythm and improvement of LVEF to 50 to 55%.  In October 2019 he underwent nuclear stress test which demonstrated apical thinning and mild to moderate anterior apical ischemia consistent with LAD occlusion.  Dr. [name] recommended cardiac catheterization to evaluate progressive CAD.  Cardiac cath demonstrated mild to moderate distal left main disease (40 to 50%), CTO of the LAD with right to left collaterals, subtotal occlusion of D1 and left circumflex and moderate stenosis of R1.  There were no PCI options and he was referred for CABG.  He underwent CABG x 2 (LIMA to LAD, free RIMA to Diag) by Dr. Laneta Simmers 02/2018.  Post bypass course was fairly uneventful aside from bradycardia requiring reduction in BB dose and volume overload requiring diuretic therapy.  He underwent emergency cholecystectomy for acute cholecystitis 05/2018.  He had frequent PACs noted on cardiac monitor but due to history of bradycardia, we could not uptitrate beta-blocker dose.  He underwent left total knee arthroscopy  08/2022.  Seen by Dr. [name] on 09/17/22 at which time he was not having any cardiac symptoms and BP was well-controlled.  LDL was 48 on most recent lipid panel.  Plan to spread out surveillance imaging if chest CT showed stable aortic dilatation.  He was advised to return in 6 months for follow-up.  Seen by me on 04/14/23 for 6 mo follow-up. He reported two episodes of palpitations in the past year, one in September and another in November, each lasting about fifteen minutes. Symptoms have not occurred with riding his recumbent bike which he does 3-4 times each week. No concerning cardiac symptoms with exertion. He discontinued famotidine due to potential that it was contributing to palpitations. Admitted he needs to work harder on losing weight. Slight increase in LDL cholesterol recently which his PCP told him "we will watch it." Admits to dietary indiscretion. Has not been able to play golf recently because his wife had hip replacement surgery, she is doing well.  Through shared decision making, we started Ozempic which was started at 0.25 mg weekly with careful up titration.  He reported on 06/14/2023 that the 0.5 mg dose was causing diarrhea.  14-day cardiac monitor revealed predominant underlying rhythm sinus rhythm with average HR 60 bpm, frequent SVE's and a burden of 24% with 1 supraventricular run lasting 38 minutes.  Differential diagnosis includes atrial flutter, atrial tachycardia, and AVNRT.  Beta-blocker dose limited by bradycardia.  He was advised to follow-up with EP due to history of atrial flutter ablation.  TTE completed 05/16/2023 revealed normal LVEF 55 to 60%, mild LVH, G1 DD, normal RV, mild to  moderate AAS, mild dilatation of ascending aorta measuring 41 mm.       History of Present Illness: .   Garrett Welch is a very pleasant 73 y.o. male who is here today for follow-up of CAD. He reports he is feeling well. He has had some loose stools, which he believes is a side effect  of Ozempic. He admits to high fat food consumption. The frequency of these episodes has been decreasing and occur about once a week. He was warned about "dumping" post gastric bypass surgery.  Weight has been about the same at home.  He reports his clothes are fitting the same. He felt occasional flutters prior to Christmas, but has not had any recent palpitations.  He denies chest pain, dyspnea, orthopnea, PND, presyncope, syncope.  He reports recent congestion for which he was prescribed antibiotics and cough medicine, which he has not finished taking. His main goal is to lose weight so he can be more active and play golf more often.   Discussed the use of AI scribe software for clinical note transcription with the patient, who gave verbal consent to proceed.   ROS: See HPI       Studies Reviewed: .         Risk Assessment/Calculations:             Physical Exam:   VS:  BP 130/66   Pulse 90   Ht 6\' 3"  (1.905 m)   Wt (!) 348 lb 3.2 oz (157.9 kg)   SpO2 95%   BMI 43.52 kg/m    Wt Readings from Last 3 Encounters:  06/30/23 (!) 348 lb 3.2 oz (157.9 kg)  04/14/23 (!) 347 lb 9.6 oz (157.7 kg)  09/17/22 (!) 327 lb (148.3 kg)    GEN: Well nourished, well developed in no acute distress NECK: No JVD; No carotid bruits CARDIAC: RRR, 2/6 systolic murmur. No rubs, gallops RESPIRATORY:  Clear to auscultation without rales, wheezing or rhonchi  ABDOMEN: Soft, non-tender, non-distended EXTREMITIES:  No edema; No deformity     ASSESSMENT AND PLAN: .    SVT/Palpitations: History of atrial flutter s/p ablation 09/2017. He is no longer on OAC. He was having increased palpitations in December. Cardiac monitor was ordered and completed 05/08/2023 which revealed frequent SVE's at a burden of 24% and a supraventricular run lasting 38 minutes. He has been on low-dose metoprolol for years.  We have not uptitrated it due to bradycardia at baseline. Recommendation to return to EP provider for  follow-up.  CAD without angina: S/p CABG x 2 in 2019. He remains active with no concerning cardiac symptoms. He denies chest pain, dyspnea, or other symptoms concerning for angina.  No indication for further ischemic evaluation at this time. No bleeding concerns. Ezetimibe 10 mg daily was started in addition to rosuvastatin 40 mg daily, but unfortunately his ALT increased to 119 on 06/22/23 and it was subsequently stopped. Recommendation to start Repatha every 14 days. He is now on GLP1 for weight loss. Encouraged continued healthy diet and regular exercise aiming for at least 150 minutes of moderate intensity exercise each week. Continue irbesartan, metoprolol, rosuvastatin.  Chronic HFpEF: Recent echo 05/16/2023 with LVEF 55 to 60%, mild LVH, G1 DD, normal RV.  He denies shortness of breath, orthopnea, PND, or edema. Weight is stable. He reports brisk urine output with Lasix 20 mg daily.  Renal function is stable on current GDMT including irbesartan, metoprolol, and furosemide. We will continue these medications.  Hyperlipidemia LDL goal < 55: Lipid panel 03/28/2023 revealed LDL 72, above goal of < 55. We added ezetimibe to rosuvastatin 40 mg daily.  Lipids improved with LDL C44, total cholesterol 115, triglycerides 80, HDL 55 on labs 06/22/23 but unfortunately, his ALT increased and ezetimibe was subsequently stopped.  We will start Repatha 140 mg every 2 weeks.  Continue rosuvastatin 40 mg daily for now.  We will recheck ALT today.  Plan for repeat lipid panel and CMET after 4 injections of Repatha.  Hypertension: BP is well controlled. Renal function is stable on labs completed 03/28/2023.  No medication changes today.   Obesity:  GLP-1 agonist started 04/14/23 in the setting of history of diabetes and CAD to aid with needed weight loss. Currently on Ozempic 0.5 mg weekly.  He is having some diarrhea but admits to continuing to eat high fat foods. Says diarrhea is improving.  He is agreeable to continue  with 1 more injection of Ozempic 0.5 and then up titrate to 1.0 mg weekly.  Advised him to let us know if he does not tolerate this dose. Encouraged avoiding high fat foods and to eat a more plant forward diet avoiding processed foods, sugar, simple carbohydrates, and saturated fat. Continue regular exercise.   Aortic stenosis: Echo 05/16/23 revealed mild aortic stenosis with mean gradient of 21 mmHg, AVA by VTI 1.59 cm. Soft systolic murmur noted on exam. He is asymptomatic. Advised him to notify us lips concerning symptoms. Will plan for 72-month follow-up with Dr. [name].  Ascending aortic dilatation: Noted to be 41 mm on echo 05/16/23. CTA with stable ectasia of ascending aorta measuring 3.9 cm on 10/07/22. Per Dr. [name], primary cardiologist, no need to repeat annual imaging due to stability of aortic dilatation.         Disposition: 6 months with Dr. [name]  Signed, Eligha Bridegroom, NP-C

## 2023-06-30 ENCOUNTER — Encounter: Payer: Self-pay | Admitting: Nurse Practitioner

## 2023-06-30 ENCOUNTER — Ambulatory Visit: Payer: Medicare Other | Attending: Nurse Practitioner | Admitting: Nurse Practitioner

## 2023-06-30 VITALS — BP 130/66 | HR 90 | Ht 75.0 in | Wt 348.2 lb

## 2023-06-30 DIAGNOSIS — Z951 Presence of aortocoronary bypass graft: Secondary | ICD-10-CM | POA: Diagnosis present

## 2023-06-30 DIAGNOSIS — E785 Hyperlipidemia, unspecified: Secondary | ICD-10-CM | POA: Diagnosis present

## 2023-06-30 DIAGNOSIS — R002 Palpitations: Secondary | ICD-10-CM | POA: Insufficient documentation

## 2023-06-30 DIAGNOSIS — I350 Nonrheumatic aortic (valve) stenosis: Secondary | ICD-10-CM | POA: Insufficient documentation

## 2023-06-30 DIAGNOSIS — I5032 Chronic diastolic (congestive) heart failure: Secondary | ICD-10-CM | POA: Insufficient documentation

## 2023-06-30 DIAGNOSIS — I2510 Atherosclerotic heart disease of native coronary artery without angina pectoris: Secondary | ICD-10-CM | POA: Diagnosis present

## 2023-06-30 DIAGNOSIS — I471 Supraventricular tachycardia, unspecified: Secondary | ICD-10-CM | POA: Diagnosis present

## 2023-06-30 DIAGNOSIS — I10 Essential (primary) hypertension: Secondary | ICD-10-CM | POA: Insufficient documentation

## 2023-06-30 DIAGNOSIS — I77810 Thoracic aortic ectasia: Secondary | ICD-10-CM | POA: Diagnosis present

## 2023-06-30 MED ORDER — SEMAGLUTIDE (1 MG/DOSE) 4 MG/3ML ~~LOC~~ SOPN: 1.0000 mg | PEN_INJECTOR | SUBCUTANEOUS | 3 refills | Status: DC

## 2023-06-30 MED ORDER — REPATHA SURECLICK 140 MG/ML ~~LOC~~ SOAJ: 140.0000 mg | SUBCUTANEOUS | 3 refills | Status: DC

## 2023-06-30 MED ORDER — FUROSEMIDE 20 MG PO TABS: 20.0000 mg | ORAL_TABLET | Freq: Every day | ORAL | 3 refills | Status: AC

## 2023-06-30 NOTE — Patient Instructions (Signed)
 Medication Instructions:   INCREASE Ozempic ( 1mg  ) subcutaneous weekly.   START Repatha Inject 140 mg into the skin every 14 (fourteen) days. - Subcutaneous   START Furosemide one (1) tablet by mouth ( 20 mg) daily.   *If you need a refill on your cardiac medications before your next appointment, please call your pharmacy*   Lab Work:  TODAY!!!! ALT  If you have labs (blood work) drawn today and your tests are completely normal, you will receive your results only by: MyChart Message (if you have MyChart) OR A paper copy in the mail If you have any lab test that is abnormal or we need to change your treatment, we will call you to review the results.   Testing/Procedures:  None ordered.   Follow-Up: At [facility], you and your health needs are our priority.  As part of our continuing mission to provide you with exceptional heart care, we have created designated Provider Care Teams.  These Care Teams include your primary Cardiologist (physician) and Advanced Practice Providers (APPs -  Physician Assistants and Nurse Practitioners) who all work together to provide you with the care you need, when you need it.  We recommend signing up for the patient portal called "MyChart".  Sign up information is provided on this After Visit Summary.  MyChart is used to connect with patients for Virtual Visits (Telemedicine).  Patients are able to view lab/test results, encounter notes, upcoming appointments, etc.  Non-urgent messages can be sent to your provider as well.   To learn more about what you can do with MyChart, go to [email or web address].    Your next appointment:   6 month(s)  Provider:   Tonny Bollman, MD     Other Instructions  Your physician wants you to follow-up in: 6 months.  You will receive a reminder letter in the mail two months in advance. If you don't receive a letter, please call our office to schedule the follow-up appointment.    1st Floor: -  Lobby - Registration  - Pharmacy  - Lab - Cafe  2nd Floor: - PV Lab - Diagnostic Testing (echo, CT, nuclear med)  3rd Floor: - Vacant  4th Floor: - TCTS (cardiothoracic surgery) - AFib Clinic - Structural Heart Clinic - Vascular Surgery  - Vascular Ultrasound  5th Floor: - HeartCare Cardiology (general and EP) - Clinical Pharmacy for coumadin, hypertension, lipid, weight-loss medications, and med management appointments    Valet parking services will be available as well.

## 2023-07-01 LAB — ALT: ALT: 69 IU/L — ABNORMAL HIGH (ref 0–44)

## 2023-07-04 ENCOUNTER — Other Ambulatory Visit (HOSPITAL_COMMUNITY): Payer: Self-pay

## 2023-07-04 ENCOUNTER — Telehealth: Payer: Self-pay | Admitting: Pharmacy Technician

## 2023-07-04 NOTE — Telephone Encounter (Signed)
-----   Message from Levi Aland sent at 07/04/2023  7:27 AM EDT ----- Good morning,   Could you check to see if PA was done for him and if so what is the reason insurance denied?  Thank you, Marcelino Duster

## 2023-07-04 NOTE — Telephone Encounter (Signed)
 Pharmacy Patient Advocate Encounter   Received notification from Physician's Office that prior authorization for [facility] is required/requested.   Insurance verification completed.   The patient is insured through [facility] [employer] .   Per test claim: PA required; PA submitted to above mentioned insurance via CoverMyMeds Key/confirmation #/EOC [id number] Status is pending

## 2023-07-04 NOTE — Telephone Encounter (Signed)
 Pharmacy Patient Advocate Encounter  Received notification from [facility] that Prior Authorization for Repatha has been DENIED.  Full denial letter will be uploaded to the media tab. See denial reason below.   PA #/Case ID/Reference #: [id number]

## 2023-07-04 NOTE — Telephone Encounter (Signed)
 Pharmacy Patient Advocate Encounter   Received notification from Pt Calls Messages that prior authorization for Praluent is required/requested.   Insurance verification completed.   The patient is insured through [facility] .   Per test claim: PA required; PA submitted to above mentioned insurance via CoverMyMeds Key/confirmation #/EOC [id number] Status is pending

## 2023-07-05 ENCOUNTER — Other Ambulatory Visit (HOSPITAL_COMMUNITY): Payer: Self-pay

## 2023-07-05 NOTE — Telephone Encounter (Signed)
 Pharmacy Patient Advocate Encounter  Received notification from [facility] that Prior Authorization for Praluent has been APPROVED from 06/20/23 to 04/25/2098. Ran test claim, Copay is $116.57- one month. This test claim was processed through [facility]- copay amounts may vary at other pharmacies due to pharmacy/plan contracts, or as the patient moves through the different stages of their insurance plan.   PA #/Case ID/Reference #: [id number]

## 2023-07-06 ENCOUNTER — Other Ambulatory Visit: Payer: Self-pay | Admitting: *Deleted

## 2023-07-06 DIAGNOSIS — I2510 Atherosclerotic heart disease of native coronary artery without angina pectoris: Secondary | ICD-10-CM

## 2023-07-06 DIAGNOSIS — E785 Hyperlipidemia, unspecified: Secondary | ICD-10-CM

## 2023-07-06 MED ORDER — PRALUENT 150 MG/ML ~~LOC~~ SOAJ: 150.0000 mg | SUBCUTANEOUS | 11 refills | Status: AC

## 2023-07-06 NOTE — Telephone Encounter (Signed)
 S/w pt is agreeable to treatment plan.  Place order for Praluent to requested pharmacy, updated medication list. Placed orders for LIPID/CMET and released.  Placed in mail today  and confirmed address.

## 2023-07-06 NOTE — Telephone Encounter (Signed)
 Please notify pt that Praluent has been approved through [facility] pharmacy for one month price of $116.57. This cost may be less if ordered through a preferred pharmacy and may also decrease as the year progresses and he pays more out of pocket cost.   If he is agreeable, please d/c Repatha Rx and order Praluent 150 mg every 14 days to his preferred pharmacy. If he starts the medication, I would like to check lipid and CMET in 2-3 months (needs to have at least 4 injections).

## 2023-07-14 ENCOUNTER — Telehealth: Payer: Self-pay | Admitting: *Deleted

## 2023-07-14 NOTE — Telephone Encounter (Signed)
 S/w pt to check on how pt was doing on ozempic. Pt has had no other episodes of loose stools.  Pt has not increased dose. Finishing the lower does of ozempic. Pt will send mychart message if has affects on increased dose.

## 2023-07-14 NOTE — Telephone Encounter (Signed)
-----   Message from [facility] Roena Sassaman G sent at 06/23/2023  7:03 AM EST ----- Check on pt on ozempic see if pt is still getting explosive diaharrea weekly and if not check to see next dose increase with Melissa.

## 2023-08-10 ENCOUNTER — Ambulatory Visit: Admitting: Internal Medicine

## 2023-08-18 ENCOUNTER — Ambulatory Visit: Admitting: Internal Medicine

## 2023-08-23 ENCOUNTER — Ambulatory Visit: Admitting: Internal Medicine

## 2023-09-12 ENCOUNTER — Other Ambulatory Visit: Payer: Self-pay

## 2023-09-12 MED ORDER — IRBESARTAN 75 MG PO TABS: 75.0000 mg | ORAL_TABLET | Freq: Every day | ORAL | 3 refills | Status: AC

## 2023-09-14 ENCOUNTER — Encounter: Payer: Self-pay | Admitting: Internal Medicine

## 2023-09-14 ENCOUNTER — Ambulatory Visit: Attending: Internal Medicine | Admitting: Internal Medicine

## 2023-09-14 VITALS — BP 112/68 | HR 74 | Ht 75.0 in | Wt 340.0 lb

## 2023-09-14 DIAGNOSIS — I4719 Other supraventricular tachycardia: Secondary | ICD-10-CM | POA: Diagnosis present

## 2023-09-14 NOTE — Progress Notes (Signed)
 HPI [name] returns today after an almost 6 year absence from our EP clinic. He is a pleasant but morbidly obese partially retired Education officer, environmental with atrial flutter who underwent catheter ablation of flutter over 6 years ago. He c/o rare palpitations and wore a cardiac monitor which demonstrated probable SVT at 150/min. Did not look like flutter but could not rule this out. Since he wore the monitor his symptoms have been well controlled. He admits to dietary indiscretion but has been placed on Semiglutide. He has lost 7 lbs. He denies chest pain or sob.  Allergies  Allergen Reactions   Tape Rash    With prolonged use--blisters   Tramadol  Hives, Rash and Other (See Comments)    Skin irritation    Amoxicillin Nausea Only   Amoxicillin-Pot Clavulanate Nausea Only   Clavulanic Acid     Other Reaction(s): Not available     Current Outpatient Medications  Medication Sig Dispense Refill   albuterol  (PROVENTIL  HFA;VENTOLIN  HFA) 108 (90 Base) MCG/ACT inhaler Inhale 2 puffs into the lungs every 6 (six) hours as needed for wheezing or shortness of breath.     Alirocumab  (PRALUENT ) 150 MG/ML SOAJ Inject 1 mL (150 mg total) into the skin every 14 (fourteen) days. 2 mL 11   b complex vitamins capsule Take 1 capsule by mouth daily.     ferrous sulfate  325 (65 FE) MG EC tablet Take 1 tablet (325 mg total) by mouth daily with breakfast. (Patient taking differently: Take 325 mg by mouth 2 (two) times daily.)  3   fluticasone  (FLONASE ) 50 MCG/ACT nasal spray Place 2 sprays into both nostrils daily as needed for allergies.      furosemide  (LASIX ) 20 MG tablet Take 1 tablet (20 mg total) by mouth daily. 90 tablet 3   irbesartan  (AVAPRO ) 75 MG tablet Take 1 tablet (75 mg total) by mouth daily. 90 tablet 3   metFORMIN  (GLUCOPHAGE ) 500 MG tablet Take 1,000 mg by mouth 2 (two) times daily.   9   metoprolol  tartrate (LOPRESSOR ) 25 MG tablet Take 0.5 tablets (12.5 mg total) by mouth 2 (two) times daily. 90  tablet 3   Multiple Vitamins-Minerals (MULTIVITAMIN WITH MINERALS) tablet Take 1 tablet by mouth daily. Centrum     Polyethyl Glycol-Propyl Glycol 0.4-0.3 % SOLN Place 1 drop into both eyes 3 (three) times daily as needed (for dry/irritated eyes.).     potassium chloride  (KLOR-CON ) 10 MEQ tablet TAKE 1 TABLET BY MOUTH EVERY DAY 90 tablet 3   pramipexole  (MIRAPEX ) 0.125 MG tablet Take 0.125 mg by mouth at bedtime.  2   rosuvastatin  (CRESTOR ) 40 MG tablet Take 1 tablet (40 mg total) by mouth daily. 90 tablet 3   Semaglutide , 1 MG/DOSE, 4 MG/3ML SOPN Inject 1 mg into the skin once a week. 3 mL 3   sodium fluoride (PREVIDENT 5000 PLUS) 1.1 % CREA dental cream Take 1 application by mouth 2 (two) times daily.      tamsulosin  (FLOMAX ) 0.4 MG CAPS capsule Take 0.4 mg by mouth at bedtime.   3   triamcinolone cream (KENALOG) 0.1 % Apply 1 application  topically daily as needed (rash).     vitamin E 180 MG (400 UNITS) capsule Take 800 Units by mouth daily.     No current facility-administered medications for this visit.     Past Medical History:  Diagnosis Date   Anemia    Atrial flutter (HCC) 09/16/2017   S/p RFCA // EF was  30% in setting of AFl w/ RVR // EF improved in NSR (Echo 8/19: Mod conc LVH, EF 50-55, Gr 1 DD, mild dilated ascending aorta (40 mm), MAC, trivial TR)   CAD (coronary artery disease) 04/06/2018   LHC 10/19: dLM 40-50, LAD 100 CTO, D1 99, LCx 99, RI 60; R-L collats, EF normal // s/p CABG 11/19 (L-LAD, RIMA-Dx)   Cancer (HCC)    Diabetes mellitus without complication (HCC)    GERD (gastroesophageal reflux disease)    HTN (hypertension)    Hyperlipidemia LDL goal <70    OA (osteoarthritis)    right knee   PAC (premature atrial contraction)    Prostate cancer (HCC)    Shingles 04/2022   Sleep apnea    cpap    ROS:   All systems reviewed and negative except as noted in the HPI.   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 Plating of sternal fracture.;  Surgeon: Bartley Lightning, MD;  Location: [facility] OR;  Service: Open Heart Surgery;  Laterality: N/A;  with bilateral IMA   GASTRIC BYPASS     20 yrs. ago   LEFT HEART CATH AND CORONARY ANGIOGRAPHY N/A 02/22/2018   Procedure: LEFT HEART CATH AND CORONARY ANGIOGRAPHY;  Surgeon: Arnoldo Lapping, MD;  Location: [facility] INVASIVE CV LAB;  Service: Cardiovascular;  Laterality: N/A;   prostate radioactive seeds implant 2009     RADIAL ARTERY HARVEST N/A 03/20/2018   Procedure: possible RADIAL ARTERY HARVEST;  Surgeon: Bartley Lightning, MD;  Location: [facility] OR;  Service: Open Heart Surgery;  Laterality: N/A;   TEE WITHOUT CARDIOVERSION N/A 03/20/2018   Procedure: TRANSESOPHAGEAL ECHOCARDIOGRAM (TEE);  Surgeon: Bartley Lightning, MD;  Location: [facility] OR;  Service: Open Heart Surgery;  Laterality: N/A;   TOTAL KNEE ARTHROPLASTY Right 08/24/2021   Procedure: TOTAL KNEE ARTHROPLASTY;  Surgeon: Liliane Rei, MD;  Location: [facility] ORS;  Service: Orthopedics;  Laterality: Right;   TOTAL KNEE ARTHROPLASTY Left 08/30/2022   Procedure: TOTAL KNEE ARTHROPLASTY;  Surgeon: Liliane Rei, MD;  Location: [facility] ORS;  Service: Orthopedics;  Laterality: Left;     Family History  Problem Relation Age of Onset   Heart failure Mother    Heart disease Mother    Heart attack Mother    Hypertension Mother    Stroke Father    Kidney failure Father    Cancer Father    Hypertension Maternal Grandmother      Social History   Socioeconomic History   Marital status: Married    Spouse name: Not on file   Number of  children: Not on file   Years of education: Not on file   Highest education level: Not on file  Occupational History   Occupation: Education officer, environmental  Tobacco Use   Smoking status: Never   Smokeless tobacco: Never  Vaping Use   Vaping status: Never Used  Substance and Sexual Activity   Alcohol use: Never   Drug use: Never   Sexual activity: Not on file  Other Topics Concern   Not on file  Social  History Narrative   Works as a Pharmacologist. Never smoker.    Social Drivers of Corporate investment banker Strain: Low Risk  (05/04/2023)   Received from [facility]   Overall Financial Resource Strain (CARDIA)    Difficulty of Paying Living Expenses: Not hard at all  Food Insecurity: No Food Insecurity (05/04/2023)   Received from [facility]   Hunger Vital Sign    Worried About Running Out of Food in the Last Year: Never true    Ran Out of Food in the Last Year: Never true  Transportation Needs: No Transportation Needs (05/04/2023)   Received from [facility] - Transportation    Lack of Transportation (Medical): No    Lack of Transportation (Non-Medical): No  Physical Activity: Insufficiently Active (09/28/2022)   Received from [facility], [facility]   Exercise Vital Sign    Days of Exercise per Week: 3 days    Minutes of Exercise per Session: 30 min  Stress: No Stress Concern Present (09/28/2022)   Received from [facility], [facility] - Occupational Stress Questionnaire    Feeling of Stress : Not at all  Social Connections: Socially Integrated (09/28/2022)   Received from [facility], [facility]   Social Network    How would you rate your social network (family, work, friends)?: Good participation with social networks  Intimate Partner Violence: Not At Risk (09/28/2022)   Received from [facility], [facility]   HITS    Over the last 12 months how often did your partner physically hurt you?: Never    Over the  last 12 months how often did your partner insult you or talk down to you?: Never    Over the last 12 months how often did your partner threaten you with physical harm?: Never    Over the last 12 months how often did your partner scream or curse at you?: Never     BP 112/68   Pulse 74   Ht 6\' 3"  (1.905 m)   Wt (!) 340 lb (154.2 kg)   SpO2 97%   BMI 42.50 kg/m   Physical Exam:  Well appearing obese man, NAD HEENT: Unremarkable Neck:  No JVD, no thyromegally Lymphatics:  No adenopathy Back:  No CVA tenderness Lungs:  Clear with no wheezes HEART:  Regular rate rhythm, no murmurs, no rubs, no clicks Abd:  soft, positive bowel sounds, no organomegally, no rebound, no guarding Ext:  2 plus pulses, no edema, no cyanosis, no clubbing Skin:  No rashes no nodules Neuro:  CN II through XII intact, motor grossly intact  EKG -  NSR with bigem PAC's and RBBB  Assess/Plan: SVT - He has not had more symptoms since her wore the cardiac monitor 5 months ago. I have recommended watchful waiting but if he has more symptoms then repeat EP study would be strongly considered. Obesity - I encouraged weight loss.  Atrial flutter - he is s/p ablation. He will continue low dose ASA. He has not had any atrial fib documented.  Pete Brand Cristy Colmenares,MD

## 2023-09-14 NOTE — Patient Instructions (Signed)
 Medication Instructions:  Your physician recommends that you continue on your current medications as directed. Please refer to the Current Medication list given to you today.  *If you need a refill on your cardiac medications before your next appointment, please call your pharmacy*  Lab Work: None ordered.  You may go to any [facility] Location for your lab work:  [facility] - [address] ([facility]) - [address] [phone] [address]  [place] - [address]   [place]  - [address]   [place] - [address] - [address] [employer]  - [address] [place] - [address] ([facility]   If you have labs (blood work) drawn today and your tests are completely normal, you will receive your results only by: [employer] (if you have MyChart)  If you have any lab test that is abnormal or we need to change your treatment, we will call you or send a MyChart message to review the results.  Testing/Procedures: None ordered.  Follow-Up: At [facility], you and your health needs are our priority.  As part of our continuing mission to provide you with exceptional heart care, we have created designated Provider Care Teams.  These Care Teams include your primary Cardiologist (physician) and Advanced Practice Providers (APPs -  Physician Assistants and Nurse Practitioners) who all work together to provide you with the care you need, when you need it.   Your next appointment:   As needed  The format for your next appointment:   In Person  Provider:   Manya Sells, MD{or one of the following Advanced Practice Providers on your designated Care Team:   Mertha Abrahams, [place] [address] [employer], [place] Neda Balk, NP

## 2023-10-01 ENCOUNTER — Encounter (HOSPITAL_BASED_OUTPATIENT_CLINIC_OR_DEPARTMENT_OTHER): Payer: Self-pay

## 2023-10-03 MED ORDER — POTASSIUM CHLORIDE ER 10 MEQ PO TBCR: 10.0000 meq | EXTENDED_RELEASE_TABLET | Freq: Every day | ORAL | 2 refills | Status: AC

## 2023-10-03 NOTE — Telephone Encounter (Signed)
 Refill request

## 2023-10-31 ENCOUNTER — Encounter (HOSPITAL_BASED_OUTPATIENT_CLINIC_OR_DEPARTMENT_OTHER): Payer: Self-pay

## 2023-10-31 ENCOUNTER — Other Ambulatory Visit: Payer: Self-pay | Admitting: Pharmacist

## 2023-10-31 MED ORDER — SEMAGLUTIDE (1 MG/DOSE) 4 MG/3ML ~~LOC~~ SOPN: 1.0000 mg | PEN_INJECTOR | SUBCUTANEOUS | 11 refills | Status: DC

## 2023-10-31 MED ORDER — SEMAGLUTIDE (1 MG/DOSE) 4 MG/3ML ~~LOC~~ SOPN: 1.0000 mg | PEN_INJECTOR | SUBCUTANEOUS | 5 refills | Status: DC

## 2024-03-06 LAB — COMPREHENSIVE METABOLIC PANEL WITH GFR
ALT: 39 IU/L (ref 0–44)
AST: 32 IU/L (ref 0–40)
Albumin: 4.2 g/dL (ref 3.8–4.8)
Alkaline Phosphatase: 56 IU/L (ref 47–123)
BUN/Creatinine Ratio: 16 (ref 10–24)
BUN: 17 mg/dL (ref 8–27)
Bilirubin Total: 0.7 mg/dL (ref 0.0–1.2)
CO2: 21 mmol/L (ref 20–29)
Calcium: 9 mg/dL (ref 8.6–10.2)
Chloride: 105 mmol/L (ref 96–106)
Creatinine, Ser: 1.05 mg/dL (ref 0.76–1.27)
Globulin, Total: 1.7 g/dL (ref 1.5–4.5)
Glucose: 134 mg/dL — ABNORMAL HIGH (ref 70–99)
Potassium: 4.4 mmol/L (ref 3.5–5.2)
Sodium: 142 mmol/L (ref 134–144)
Total Protein: 5.9 g/dL — ABNORMAL LOW (ref 6.0–8.5)
eGFR: 76 mL/min/1.73 (ref >59)

## 2024-03-06 LAB — LIPID PANEL
Chol/HDL Ratio: 1.7 ratio (ref 0.0–5.0)
Cholesterol, Total: 92 mg/dL — ABNORMAL LOW (ref 100–199)
HDL: 53 mg/dL (ref >39)
LDL Chol Calc (NIH): 24 mg/dL (ref 0–99)
Triglycerides: 68 mg/dL (ref 0–149)
VLDL Cholesterol Cal: 15 mg/dL (ref 5–40)

## 2024-03-07 ENCOUNTER — Ambulatory Visit: Payer: Self-pay | Admitting: Nurse Practitioner

## 2024-03-09 ENCOUNTER — Ambulatory Visit: Attending: Cardiovascular Disease | Admitting: Cardiovascular Disease

## 2024-03-09 ENCOUNTER — Encounter: Payer: Self-pay | Admitting: Cardiovascular Disease

## 2024-03-09 ENCOUNTER — Other Ambulatory Visit: Payer: Self-pay | Admitting: Pharmacist

## 2024-03-09 VITALS — BP 126/68 | HR 60 | Ht 75.0 in | Wt 338.0 lb

## 2024-03-09 DIAGNOSIS — I2510 Atherosclerotic heart disease of native coronary artery without angina pectoris: Secondary | ICD-10-CM | POA: Insufficient documentation

## 2024-03-09 DIAGNOSIS — E1169 Type 2 diabetes mellitus with other specified complication: Secondary | ICD-10-CM

## 2024-03-09 DIAGNOSIS — I4719 Other supraventricular tachycardia: Secondary | ICD-10-CM | POA: Insufficient documentation

## 2024-03-09 DIAGNOSIS — I350 Nonrheumatic aortic (valve) stenosis: Secondary | ICD-10-CM | POA: Diagnosis present

## 2024-03-09 DIAGNOSIS — E785 Hyperlipidemia, unspecified: Secondary | ICD-10-CM | POA: Diagnosis present

## 2024-03-09 MED ORDER — OZEMPIC (2 MG/DOSE) 8 MG/3ML ~~LOC~~ SOPN: 2.0000 mg | PEN_INJECTOR | SUBCUTANEOUS | 1 refills | Status: AC

## 2024-03-09 NOTE — Progress Notes (Signed)
 Cardiology Office Note:    Date:  03/09/2024   ID:  Garrett Welch, DOB 1950/06/01, MRN [id number]  PCP:  Petrina Coy, MD   [facility] Providers Cardiologist:  Ozell Fell, MD Electrophysiologist:  Danelle Birmingham, MD     Referring MD: Petrina Coy, MD   Chief Complaint  Patient presents with   Coronary Artery Disease    History of Present Illness:    Garrett Welch is a 73 y.o. male presenting for follow-up of coronary artery disease.  The patient underwent two-vessel CABG in 2019.  Comorbid conditions include hypertension, mixed hyperlipidemia, type 2 diabetes, atrial flutter status post ablation, obstructive sleep apnea, and morbid obesity BMI greater than 40.  His surgery involve the LIMA to LAD and free RIMA to diagonal in 2019.  He developed severe LV dysfunction and atrial flutter with RVR and has done well since undergoing ablation, maintaining sinus rhythm.  His last echo in January 2025 showed LVEF 55 to 60%, normal LV function, normal RV function, and mild to moderate aortic stenosis with a mean gradient of 21 mmHg and calculated aortic valve area of 1.59 cm.  The patient is here alone today.  He is doing well and denies any recent problems with chest pain, chest pressure, or shortness of breath.  He is now on a GLP-1 and had some trouble tolerating this initially due to diarrhea, but his side effects have completely stabilized and he asks today about increasing the dose.  He also tells me that his insurance will no longer cover Praluent .  He feels like he is tolerating his medicines well.  He had a recent episode of tachycardia and symptoms lasted a total of 30 minutes.  He cannot even remember the last time this occurred.   Current Medications: Current Meds  Medication Sig   albuterol  (PROVENTIL  HFA;VENTOLIN  HFA) 108 (90 Base) MCG/ACT inhaler Inhale 2 puffs into the lungs every 6 (six) hours as needed for wheezing or shortness of  breath.   Alirocumab  (PRALUENT ) 150 MG/ML SOAJ Inject 1 mL (150 mg total) into the skin every 14 (fourteen) days.   b complex vitamins capsule Take 1 capsule by mouth daily.   famotidine  (PEPCID ) 40 MG tablet Take 40 mg by mouth daily.   ferrous sulfate  325 (65 FE) MG EC tablet Take 1 tablet (325 mg total) by mouth daily with breakfast. (Patient taking differently: Take 325 mg by mouth 2 (two) times daily.)   fluticasone  (FLONASE ) 50 MCG/ACT nasal spray Place 2 sprays into both nostrils daily as needed for allergies.    furosemide  (LASIX ) 20 MG tablet Take 1 tablet (20 mg total) by mouth daily.   irbesartan  (AVAPRO ) 75 MG tablet Take 1 tablet (75 mg total) by mouth daily.   metFORMIN  (GLUCOPHAGE ) 500 MG tablet Take 1,000 mg by mouth 2 (two) times daily.    metoprolol  tartrate (LOPRESSOR ) 25 MG tablet Take 0.5 tablets (12.5 mg total) by mouth 2 (two) times daily.   Multiple Vitamins-Minerals (MULTIVITAMIN WITH MINERALS) tablet Take 1 tablet by mouth daily. Centrum   Polyethyl Glycol-Propyl Glycol 0.4-0.3 % SOLN Place 1 drop into both eyes 3 (three) times daily as needed (for dry/irritated eyes.).   potassium chloride  (KLOR-CON ) 10 MEQ tablet Take 1 tablet (10 mEq total) by mouth daily.   pramipexole  (MIRAPEX ) 0.125 MG tablet Take 0.125 mg by mouth at bedtime.   rosuvastatin  (CRESTOR ) 40 MG tablet Take 1 tablet (40 mg total) by mouth daily.   sodium  fluoride (PREVIDENT 5000 PLUS) 1.1 % CREA dental cream Take 1 application by mouth 2 (two) times daily.    tamsulosin  (FLOMAX ) 0.4 MG CAPS capsule Take 0.4 mg by mouth at bedtime.    triamcinolone cream (KENALOG) 0.1 % Apply 1 application  topically daily as needed (rash).   vitamin E 180 MG (400 UNITS) capsule Take 800 Units by mouth daily.   [DISCONTINUED] Semaglutide , 1 MG/DOSE, 4 MG/3ML SOPN Inject 1 mg into the skin once a week.     Allergies:   Tape, Tramadol , Amoxicillin, Amoxicillin-pot clavulanate, and Clavulanic acid   ROS:   Please see  the history of present illness.    All other systems reviewed and are negative.  EKGs/Labs/Other Studies Reviewed:    The following studies were reviewed today: Cardiac Studies & Procedures   ______________________________________________________________________________________________ CARDIAC CATHETERIZATION  CARDIAC CATHETERIZATION 02/22/2018  Conclusion  Prox RCA to Mid RCA lesion is 25% stenosed.  Post Atrio lesion is 50% stenosed.  Mid LM to Dist LM lesion is 50% stenosed.  Ost Ramus to Ramus lesion is 60% stenosed.  Ost Cx to Prox Cx lesion is 99% stenosed.  Ost LAD to Prox LAD lesion is 95% stenosed.  Prox LAD lesion is 100% stenosed.  Ost 1st Diag lesion is 99% stenosed.  The left ventricular ejection fraction is 50-55% by visual estimate.  1.  Mild to moderate distal left main stenosis estimated at 40 to 50% 2.  Chronic total occlusion of the proximal LAD with right to left collaterals supplying the mid and distal LAD territory 3.  Subtotal occlusion of the first diagonal branch in the left circumflex 4.  Moderate stenosis of the large ramus intermedius branch estimated at 60% 5.  Patent RCA with mild nonobstructive stenosis (large dominant vessel) 6.  Mild segmental contraction abnormality the left ventricle with preserved overall LV systolic function  Recommend: The patient does not have typical symptoms of angina.  However he has moderate ischemia on stress testing and based on assessment of his coronary anatomy as at large burden of potential ischemia involving the LAD, diagonal branch, circumflex, and intermediate branches.  I do not think he has any feasible PCI options.  I am going to refer him for an outpatient cardiac surgical evaluation for consideration of CABG versus ongoing medical therapy.  Findings Coronary Findings Diagnostic  Dominance: Right  Left Main Mid LM to Dist LM lesion is 50% stenosed.  Left Anterior Descending Ost LAD to Prox LAD  lesion is 95% stenosed. Prox LAD lesion is 100% stenosed. The LAD is occluded proximally at the origin of the first diagonal. The vessel fills via a well-formed collateral from the distal RCA.  First Diagonal Branch Ost 1st Diag lesion is 99% stenosed. the first diagonal is large in caliber with subtotal proximal occlusion. Fills late.  Second Septal Branch Collaterals 2nd Sept filled by collaterals from 1st RPL.  Third Septal Branch Collaterals 3rd Sept filled by collaterals from RPDA.  Ramus Intermedius Ost Ramus to Ramus lesion is 60% stenosed. Diffuse 60% stenosis of the ramus intermedius. This branch is large and supplies multiple sub-branches.  Left Circumflex Ost Cx to Prox Cx lesion is 99% stenosed. small circumflex, subtotal proximal occlusion, fills late  Right Coronary Artery Prox RCA to Mid RCA lesion is 25% stenosed.  Right Posterior Atrioventricular Artery Post Atrio lesion is 50% stenosed.  Intervention  No interventions have been documented.   STRESS TESTS  MYOCARDIAL PERFUSION IMAGING 01/10/2018  Interpretation Summary  Nuclear stress  EF: 41%.  Blood pressure demonstrated a normal response to exercise.  There was no ST segment deviation noted during stress.  Defect 1: There is a medium defect of moderate severity present in the apical anterior, apical septal, apical inferior, apical lateral and apex location.  This is an intermediate risk study.  Findings consistent with ischemia.  The left ventricular ejection fraction is moderately decreased (30-44%).  Abnormal, intermediate risk stress nuclear study with apical thinning and superimposed mild to moderate ischemia in the apex.  The gated ejection fraction is 41% though visually appears better.  Hypokinesis of the distal septum and apex.  Mild left ventricular enlargement.   ECHOCARDIOGRAM  ECHOCARDIOGRAM COMPLETE 05/16/2023  Narrative ECHOCARDIOGRAM REPORT    Patient Name:   Arjen Deringer Welch Date of Exam: 05/16/2023 Medical Rec #:  [id number]                  Height:       75.0 in Accession #:    [id number]                 Weight:       347.6 lb Date of Birth:  12-19-1950                  BSA:          2.775 m Patient Age:    72 years                   BP:           132/78 mmHg Patient Gender: M                          HR:           57 bpm. Exam Location:  [address]  Procedure: 2D Echo, Cardiac Doppler and Color Doppler  Indications:    I35.0 Aortic Stenosis  History:        Patient has prior history of Echocardiogram examinations, most recent 05/27/2021. CAD, Prior CABG, Arrythmias:Atrial Flutter; Risk Factors:Hypertension, Diabetes and Dyslipidemia.  Sonographer:    Carl Coma RDCS Referring Phys: [id number] ROSAL[address] HERO SWINYER  IMPRESSIONS   1. Left ventricular ejection fraction, by estimation, is 55 to 60%. The left ventricle has normal function. The left ventricle has no regional wall motion abnormalities. There is mild concentric left ventricular hypertrophy. Left ventricular diastolic parameters are consistent with Grade I diastolic dysfunction (impaired relaxation). 2. Right ventricular systolic function is low normal. The right ventricular size is normal. 3. Left atrial size was mildly dilated. 4. The mitral valve is abnormal. Trivial mitral valve regurgitation. 5. Aortic valve DVI 0.34. The aortic valve is calcified. There is moderate calcification of the aortic valve. There is moderate thickening of the aortic valve. Aortic valve regurgitation is not visualized. Mild to moderate aortic valve stenosis. Aortic valve area, by VTI measures 1.59 cm. Aortic valve mean gradient measures 21.0 mmHg. Aortic valve Vmax measures 2.96 m/s. 6. Aortic dilatation noted. There is mild dilatation of the ascending aorta, measuring 41 mm.  Comparison(s): Progression of aortic valve disease since previous echocardiogram.  FINDINGS Left Ventricle: Left  ventricular ejection fraction, by estimation, is 55 to 60%. The left ventricle has normal function. The left ventricle has no regional wall motion abnormalities. The left ventricular internal cavity size was normal in size. There is mild concentric left ventricular hypertrophy. Left ventricular diastolic parameters are consistent with Grade I diastolic  dysfunction (impaired relaxation).  Right Ventricle: The right ventricular size is normal. No increase in right ventricular wall thickness. Right ventricular systolic function is low normal.  Left Atrium: Left atrial size was mildly dilated.  Right Atrium: Right atrial size was normal in size.  Pericardium: There is no evidence of pericardial effusion.  Mitral Valve: The mitral valve is abnormal. There is mild thickening of the mitral valve leaflet(s). Mild to moderate mitral annular calcification. Trivial mitral valve regurgitation.  Tricuspid Valve: The tricuspid valve is normal in structure. Tricuspid valve regurgitation is mild.  Aortic valve DVI 0.34. The aortic valve is calcified. There is moderate calcification of the aortic valve. There is moderate thickening of the aortic valve. Aortic valve regurgitation is not visualized. Mild to moderate aortic stenosis is present. Pulmonic Valve: The pulmonic valve was grossly normal. Pulmonic valve regurgitation is trivial.  Aorta: Aortic dilatation noted and the aortic root is normal in size and structure. There is mild dilatation of the ascending aorta, measuring 41 mm.  IAS/Shunts: No atrial level shunt detected by color flow Doppler.   LEFT VENTRICLE PLAX 2D LVIDd:         6.00 cm   Diastology LVIDs:         4.20 cm   LV e' medial:    6.20 cm/s LV PW:         1.10 cm   LV E/e' medial:  14.2 LV IVS:        1.10 cm   LV e' lateral:   7.73 cm/s LVOT diam:     2.20 cm   LV E/e' lateral: 11.4 LV SV:         100 LV SV Index:   36 LVOT Area:     3.80 cm   RIGHT VENTRICLE              IVC RV Basal diam:  4.70 cm     IVC diam: 1.20 cm RV S prime:     10.45 cm/s TAPSE (M-mode): 2.0 cm  LEFT ATRIUM             Index        RIGHT ATRIUM           Index LA diam:        5.10 cm 1.84 cm/m   RA Area:     22.50 cm LA Vol (A2C):   98.6 ml 35.53 ml/m  RA Volume:   80.40 ml  28.97 ml/m LA Vol (A4C):   78.2 ml 28.18 ml/m LA Biplane Vol: 90.1 ml 32.46 ml/m AORTIC VALVE AV Area (Vmax):    1.33 cm AV Area (Vmean):   1.37 cm AV Area (VTI):     1.59 cm AV Vmax:           296.00 cm/s AV Vmean:          195.400 cm/s AV VTI:            0.629 m AV Peak Grad:      35.0 mmHg AV Mean Grad:      21.0 mmHg LVOT Vmax:         103.40 cm/s LVOT Vmean:        70.500 cm/s LVOT VTI:          0.263 m LVOT/AV VTI ratio: 0.42  AORTA Ao Root diam: 4.20 cm Ao Asc diam:  4.10 cm  MITRAL VALVE MV Area (PHT): 2.17 cm     SHUNTS MV Decel Time:  349 msec     Systemic VTI:  0.26 m MV E velocity: 88.00 cm/s   Systemic Diam: 2.20 cm MV A velocity: 141.00 cm/s MV E/A ratio:  0.62  Morene Brownie Electronically signed by Morene Brownie Signature Date/Time: 05/16/2023/8:54:28 AM    Final   TEE  ECHO TEE 03/20/2018  Interpretation Summary  Septum: No Patent Foramen Ovale present.  Left atrium: Patent foramen ovale not present.  MONITORS  LONG TERM MONITOR (3-14 DAYS) 05/04/2023  Narrative Patch Wear Time:  13 days and 23 hours (2024-12-19T10:52:53-0500 to 2025-01-02T10:52:45-0500)  Patient had a min HR of 31 bpm, max HR of 197 bpm, and avg HR of 60 bpm. Predominant underlying rhythm was Sinus Rhythm. First Degree AV Block was present. Slight P wave morphology changes were noted. Bundle Branch Block/IVCD was present. 1 run of Ventricular Tachycardia occurred lasting 7 beats with a max rate of 140 bpm (avg 129 bpm). 41 Supraventricular Tachycardia runs occurred, the run with the fastest interval lasting 9 beats with a max rate of 197 bpm, the longest lasting 38 mins 31 secs with  an avg rate of 139 bpm. Supraventricular Tachycardia was detected within +/- 45 seconds of symptomatic patient event(s). Isolated SVEs were frequent (24.2%, [id number]), SVE Couplets were rare (<1.0%, 1753), and SVE Triplets were rare (<1.0%, 769). Isolated VEs were rare (<1.0%), VE Couplets were rare (<1.0%), and no VE Triplets were present.  SUMMARY: the basic rhythm is normal sinus with an average HR of 60 bpm. There are frequent SVE's at a burden of 24% and a supraventricular run lasting 38 minutes. Diff dx includes atrial flutter, atrial tach, and AVNRT. Pt on beta blocker with dose limited by bradycardia. Favor EP referral (PT with hx atrial flutter ablation with Dr Waddell)       ______________________________________________________________________________________________      EKG:        Recent Labs: 03/06/2024: ALT 39; BUN 17; Creatinine, Ser 1.05; Potassium 4.4; Sodium 142  Recent Lipid Panel    Component Value Date/Time   CHOL 92 (L) 03/06/2024 0852   TRIG 68 03/06/2024 0852   HDL 53 03/06/2024 0852   CHOLHDL 1.7 03/06/2024 0852   CHOLHDL 3.0 06/05/2015 0855   VLDL 13 06/05/2015 0855   LDLCALC 24 03/06/2024 0852     Risk Assessment/Calculations:                Physical Exam:    VS:  BP 126/68   Pulse 60   Ht 6' 3 (1.905 m)   Wt (!) 338 lb (153.3 kg)   SpO2 97%   BMI 42.25 kg/m     Wt Readings from Last 3 Encounters:  03/09/24 (!) 338 lb (153.3 kg)  09/14/23 (!) 340 lb (154.2 kg)  06/30/23 (!) 348 lb 3.2 oz (157.9 kg)     GEN:  Well nourished, well developed in no acute distress HEENT: Normal NECK: No JVD; No carotid bruits LYMPHATICS: No lymphadenopathy CARDIAC: RRR, 2/6 systolic murmur at the right upper sternal border RESPIRATORY:  Clear to auscultation without rales, wheezing or rhonchi  ABDOMEN: Soft, non-tender, non-distended MUSCULOSKELETAL:  No edema; No deformity  SKIN: Warm and dry NEUROLOGIC:  Alert and oriented x 3 PSYCHIATRIC:  Normal  affect   Assessment & Plan Nonrheumatic aortic valve stenosis The patient has mild to moderate aortic stenosis.  He will return in 1 year for follow-up.  Anticipate repeat echo in 2 years.  LVEF normal at 55 to 60% with calculated aortic valve area  1.59 cm. Coronary artery disease involving native coronary artery of native heart without angina pectoris Doing very well with arterial conduits after CABG.  Continue current management.  No anginal symptoms. Hyperlipidemia LDL goal <55 Treated with Praluent  and rosuvastatin .  Most recent cholesterol 92, LDL 24.  Will reach out to pharmacy and see if he needs to be changed to Repatha . Atrial tachycardia Isolated episode reported recently.  Continue to monitor.  Continue beta-blocker.  Follow-up EP if symptoms increase.            Medication Adjustments/Labs and Tests Ordered: Current medicines are reviewed at length with the patient today.  Concerns regarding medicines are outlined above.  Orders Placed This Encounter  Procedures   AMB Referral to [facility] Pharm-[facility]   No orders of the defined types were placed in this encounter.   Patient Instructions  Medication Instructions:  Your physician recommends that you continue on your current medications as directed. Please refer to the Current Medication list given to you today.  *If you need a refill on your cardiac medications before your next appointment, please call your pharmacy*   Follow-Up: At [facility], you and your health needs are our priority.  As part of our continuing mission to provide you with exceptional heart care, our providers are all part of one team.  This team includes your primary Cardiologist (physician) and Advanced Practice Providers or APPs (Physician Assistants and Nurse Practitioners) who all work together to provide you with the care you need, when you need it.  Your next appointment:   1 year(s)  Provider:   Ozell Fell, MD or One of our  Advanced Practice Providers (APPs): Morse Clause, PA-C  Lamarr Satterfield, NP Miriam Shams, NP  Olivia Pavy, PA-C Josefa Beauvais, NP  Leontine Salen, PA-C Orren Fabry, PA-C  [place], PA-C Ernest Dick, NP  Damien Braver, NP Jon Hails, PA-C  Waddell Donath, PA-C    Dayna Dunn, PA-C  Scott Weaver, PA-C Lum Louis, NP Katlyn West, NP Callie Goodrich, PA-C  Xika Zhao, NP Sheng Haley, PA-C    Kathleen Johnson, PA-C       We recommend signing up for the patient portal called MyChart.  Sign up information is provided on this After Visit Summary.  MyChart is used to connect with patients for Virtual Visits (Telemedicine).  Patients are able to view lab/test results, encounter notes, upcoming appointments, etc.  Non-urgent messages can be sent to your provider as well.   To learn more about what you can do with MyChart, go to [email or web address].   Other Instructions Referral sent to PharmD           Signed, Ozell Fell, MD  03/09/2024 1:41 PM    [facility]

## 2024-03-09 NOTE — Patient Instructions (Addendum)
 Medication Instructions:  Your physician recommends that you continue on your current medications as directed. Please refer to the Current Medication list given to you today.  *If you need a refill on your cardiac medications before your next appointment, please call your pharmacy*   Follow-Up: At [facility], you and your health needs are our priority.  As part of our continuing mission to provide you with exceptional heart care, our providers are all part of one team.  This team includes your primary Cardiologist (physician) and Advanced Practice Providers or APPs (Physician Assistants and Nurse Practitioners) who all work together to provide you with the care you need, when you need it.  Your next appointment:   1 year(s)  Provider:   Ozell Fell, MD or One of our Advanced Practice Providers (APPs): Morse Clause, PA-C  Lamarr Satterfield, NP Miriam Shams, NP  Olivia Pavy, PA-C Josefa Beauvais, NP  Leontine Salen, PA-C Orren Fabry, PA-C  [place], PA-C Ernest Dick, NP  Damien Braver, NP Jon Hails, PA-C  Waddell Donath, PA-C    Dayna Dunn, PA-C  Scott Weaver, PA-C Lum Louis, NP Katlyn West, NP Callie Goodrich, PA-C  Xika Zhao, NP Sheng Haley, PA-C    Kathleen Johnson, PA-C       We recommend signing up for the patient portal called MyChart.  Sign up information is provided on this After Visit Summary.  MyChart is used to connect with patients for Virtual Visits (Telemedicine).  Patients are able to view lab/test results, encounter notes, upcoming appointments, etc.  Non-urgent messages can be sent to your provider as well.   To learn more about what you can do with MyChart, go to [email or web address].   Other Instructions Referral sent to PharmD

## 2024-03-09 NOTE — Assessment & Plan Note (Signed)
 Doing very well with arterial conduits after CABG.  Continue current management.  No anginal symptoms.

## 2024-05-03 NOTE — Progress Notes (Signed)
 Patient ID: Garrett Welch                 DOB: 1950/07/12                    MRN: [id number]      HPI: Garrett Welch is a 74 y.o. male patient referred to lipid clinic by Dr. Wonda. PMH is significant for HTN, HLD, T2DM, Aflutter (s/p ablation), OSA, obesity, aortic valve stenosis, and CAD w/p CABG x 2.   At the most recent visit with Dr. Wonda on 03/09/2024, the patient stated that his insurance will no longer cover Praluent , which was the reason for the referral to pharmacy.   At today's visit, he reports that he has tolerated both Praluent  and rosuvastatin  well. Denies any issues with side effects at this time. He received a letter from his the [employer] company, which states that they will no longer cover Praluent . He still has 1 pen remaining.   Current Medications: Praluent  150 mg every 14 days, rosuvastatin  40 mg daily Intolerances: N/A Risk Factors: >65YO, HTN, CAD s/p CABG x 2, T2DM LDL-C goal: <55 mg/dL  Diet:  Ozempic  has help reduced his appetite Beverages: water , coffee, sweet tea with Splenda, Coke Zero  Exercise:  Dumbbells and recumbent bike - three days per week  Family History:  Mother: HF, MI, HTN Father: Stroke  Social History:  Tobacco: never  EtOH: none  Labs: Lipid Panel     Component Value Date/Time   CHOL 92 (L) 03/06/2024 0852   TRIG 68 03/06/2024 0852   HDL 53 03/06/2024 0852   CHOLHDL 1.7 03/06/2024 0852   CHOLHDL 3.0 06/05/2015 0855   VLDL 13 06/05/2015 0855   LDLCALC 24 03/06/2024 0852   LABVLDL 15 03/06/2024 0852    Past Medical History:  Diagnosis Date   Anemia    Atrial flutter (HCC) 09/16/2017   S/p RFCA // EF was 30% in setting of AFl w/ RVR // EF improved in NSR (Echo 8/19: Mod conc LVH, EF 50-55, Gr 1 DD, mild dilated ascending aorta (40 mm), MAC, trivial TR)   CAD (coronary artery disease) 04/06/2018   LHC 10/19: dLM 40-50, LAD 100 CTO, D1 99, LCx 99, RI 60; R-L collats, EF normal // s/p CABG 11/19  (L-LAD, RIMA-Dx)   Cancer (HCC)    Diabetes mellitus without complication (HCC)    GERD (gastroesophageal reflux disease)    HTN (hypertension)    Hyperlipidemia LDL goal <70    OA (osteoarthritis)    right knee   PAC (premature atrial contraction)    Prostate cancer (HCC)    Shingles 04/2022   Sleep apnea    cpap    Medications Ordered Prior to Encounter[1]  Allergies[2]  Assessment/Plan:  1. Hyperlipidemia -  Hyperlipidemia LDL goal <70 Assessment:  LDL-C at goal of <55 mg/dL  Reviewed current Part D coverage, which showed Praluent  as a preferred agent. Attempted to initiate a PA, but the insurance stated that it was not required for coverage. Collaborated with advocate team to test claim Praluent , but it was the refill was too soon.  Discussed importance of protein intake with Ozempic  use, avoidance of saturated fats, and increasing exercise as able  Plan:  Continue Praluent  150 mg every 14 days and rosuvastatin  40 mg daily Encourage increased exercise and protein intake, as well as reducing saturated fats as able Provided my direct phone number if he has issues picking up his prescription at the  pharmacy    Thank you,  Shay Jhaveri D Nalini Alcaraz, Pharm.JONETTA SARAN, CPP [facility] of [facility] [address] [place], [place] [address]  Phone: [phone]; Fax: [phone]      [1]  Current Outpatient Medications on File Prior to Visit  Medication Sig Dispense Refill   albuterol  (PROVENTIL  HFA;VENTOLIN  HFA) 108 (90 Base) MCG/ACT inhaler Inhale 2 puffs into the lungs every 6 (six) hours as needed for wheezing or shortness of breath.     Alirocumab  (PRALUENT ) 150 MG/ML SOAJ Inject 1 mL (150 mg total) into the skin every 14 (fourteen) days. 2 mL 11   b complex vitamins capsule Take 1 capsule by mouth daily.     famotidine  (PEPCID ) 40 MG tablet Take 40 mg by mouth daily.     ferrous sulfate  325 (65 FE) MG EC tablet Take 1 tablet (325  mg total) by mouth daily with breakfast.  3   fluticasone  (FLONASE ) 50 MCG/ACT nasal spray Place 2 sprays into both nostrils daily as needed for allergies.      furosemide  (LASIX ) 20 MG tablet Take 1 tablet (20 mg total) by mouth daily. 90 tablet 3   irbesartan  (AVAPRO ) 75 MG tablet Take 1 tablet (75 mg total) by mouth daily. 90 tablet 3   metFORMIN  (GLUCOPHAGE ) 500 MG tablet Take 1,000 mg by mouth 2 (two) times daily.   9   metoprolol  tartrate (LOPRESSOR ) 25 MG tablet Take 0.5 tablets (12.5 mg total) by mouth 2 (two) times daily. 90 tablet 3   Multiple Vitamins-Minerals (MULTIVITAMIN WITH MINERALS) tablet Take 1 tablet by mouth daily. Centrum     Polyethyl Glycol-Propyl Glycol 0.4-0.3 % SOLN Place 1 drop into both eyes 3 (three) times daily as needed (for dry/irritated eyes.).     potassium chloride  (KLOR-CON ) 10 MEQ tablet Take 1 tablet (10 mEq total) by mouth daily. 90 tablet 2   pramipexole  (MIRAPEX ) 0.125 MG tablet Take 0.125 mg by mouth at bedtime.  2   rosuvastatin  (CRESTOR ) 40 MG tablet Take 1 tablet (40 mg total) by mouth daily. 90 tablet 3   Semaglutide , 2 MG/DOSE, (OZEMPIC , 2 MG/DOSE,) 8 MG/3ML SOPN Inject 2 mg into the skin once a week. 9 mL 1   sodium fluoride (PREVIDENT 5000 PLUS) 1.1 % CREA dental cream Take 1 application by mouth 2 (two) times daily.      tamsulosin  (FLOMAX ) 0.4 MG CAPS capsule Take 0.4 mg by mouth at bedtime.   3   triamcinolone cream (KENALOG) 0.1 % Apply 1 application  topically daily as needed (rash).     vitamin E 180 MG (400 UNITS) capsule Take 800 Units by mouth daily.     No current facility-administered medications on file prior to visit.  [2]  Allergies Allergen Reactions   Tape Rash    With prolonged use--blisters   Tramadol  Hives, Rash and Other (See Comments)    Skin irritation    Amoxicillin Nausea Only   Amoxicillin-Pot Clavulanate Nausea Only   Clavulanic Acid     Other Reaction(s): Not available

## 2024-05-04 ENCOUNTER — Other Ambulatory Visit (HOSPITAL_COMMUNITY): Payer: Self-pay

## 2024-05-04 ENCOUNTER — Ambulatory Visit: Attending: Cardiology | Admitting: Pharmacist

## 2024-05-04 DIAGNOSIS — E785 Hyperlipidemia, unspecified: Secondary | ICD-10-CM | POA: Insufficient documentation

## 2024-05-04 NOTE — Assessment & Plan Note (Signed)
 Assessment:  LDL-C at goal of <55 mg/dL  Reviewed current Part D coverage, which showed Praluent  as a preferred agent. Attempted to initiate a PA, but the insurance stated that it was not required for coverage. Collaborated with advocate team to test claim Praluent , but it was the refill was too soon.  Discussed importance of protein intake with Ozempic  use, avoidance of saturated fats, and increasing exercise as able  Plan:  Continue Praluent  150 mg every 14 days and rosuvastatin  40 mg daily Encourage increased exercise and protein intake, as well as reducing saturated fats as able Provided my direct phone number if he has issues picking up his prescription at the pharmacy

## 2024-05-24 ENCOUNTER — Other Ambulatory Visit: Payer: Self-pay | Admitting: Nurse Practitioner

## 2024-05-24 ENCOUNTER — Other Ambulatory Visit: Payer: Self-pay | Admitting: Cardiovascular Disease

## 2024-05-28 ENCOUNTER — Encounter: Payer: Self-pay | Admitting: Cardiovascular Disease

## 2024-05-29 MED ORDER — ROSUVASTATIN CALCIUM 40 MG PO TABS: 40.0000 mg | ORAL_TABLET | Freq: Every day | ORAL | 3 refills | Status: AC
